# Patient Record
Sex: Male | Born: 1960 | Race: Black or African American | Hispanic: No | Marital: Married | Smoking: Former smoker
Health system: Southern US, Community
[De-identification: ages and names within clinical notes are randomized; demographics above are authoritative.]

## PROBLEM LIST (undated history)

## (undated) DIAGNOSIS — L729 Follicular cyst of the skin and subcutaneous tissue, unspecified: Secondary | ICD-10-CM

## (undated) DIAGNOSIS — Z972 Presence of dental prosthetic device (complete) (partial): Secondary | ICD-10-CM

## (undated) DIAGNOSIS — C259 Malignant neoplasm of pancreas, unspecified: Secondary | ICD-10-CM

## (undated) HISTORY — PX: LAPAROSCOPIC INGUINAL HERNIA REPAIR: SUR788

## (undated) HISTORY — DX: Malignant neoplasm of pancreas, unspecified: C25.9

## (undated) HISTORY — PX: FACIAL RECONSTRUCTION SURGERY: SHX631

---

## 1999-02-07 ENCOUNTER — Emergency Department (HOSPITAL_COMMUNITY): Admission: EM | Admit: 1999-02-07 | Discharge: 1999-02-07 | Payer: Self-pay | Admitting: Emergency Medicine

## 2003-11-19 ENCOUNTER — Encounter: Admission: RE | Admit: 2003-11-19 | Discharge: 2003-11-19 | Payer: Self-pay | Admitting: Sports Medicine

## 2005-06-09 ENCOUNTER — Ambulatory Visit (HOSPITAL_COMMUNITY): Admission: RE | Admit: 2005-06-09 | Discharge: 2005-06-09 | Payer: Self-pay | Admitting: Sports Medicine

## 2007-10-04 ENCOUNTER — Emergency Department (HOSPITAL_COMMUNITY): Admission: EM | Admit: 2007-10-04 | Discharge: 2007-10-04 | Payer: Self-pay | Admitting: Family Medicine

## 2007-10-22 ENCOUNTER — Emergency Department (HOSPITAL_COMMUNITY): Admission: EM | Admit: 2007-10-22 | Discharge: 2007-10-22 | Payer: Self-pay | Admitting: Emergency Medicine

## 2010-05-17 ENCOUNTER — Emergency Department (HOSPITAL_COMMUNITY): Admission: EM | Admit: 2010-05-17 | Discharge: 2010-05-17 | Payer: Self-pay | Admitting: Emergency Medicine

## 2010-10-01 ENCOUNTER — Encounter: Payer: Self-pay | Admitting: Sports Medicine

## 2011-05-30 ENCOUNTER — Inpatient Hospital Stay (INDEPENDENT_AMBULATORY_CARE_PROVIDER_SITE_OTHER)
Admission: RE | Admit: 2011-05-30 | Discharge: 2011-05-30 | Disposition: A | Discharge status: Home / Self Care | Payer: Self-pay | Source: Ambulatory Visit | Attending: Family Medicine | Admitting: Family Medicine

## 2011-05-30 DIAGNOSIS — M799 Soft tissue disorder, unspecified: Secondary | ICD-10-CM

## 2014-08-19 ENCOUNTER — Emergency Department (INDEPENDENT_AMBULATORY_CARE_PROVIDER_SITE_OTHER)
Admission: EM | Admit: 2014-08-19 | Discharge: 2014-08-19 | Disposition: A | Discharge status: Home / Self Care | Payer: Self-pay | Source: Home / Self Care

## 2014-08-19 ENCOUNTER — Encounter (HOSPITAL_COMMUNITY): Payer: Self-pay | Admitting: Emergency Medicine

## 2014-08-19 DIAGNOSIS — S134XXA Sprain of ligaments of cervical spine, initial encounter: Secondary | ICD-10-CM

## 2014-08-19 MED ORDER — HYDROCODONE-ACETAMINOPHEN 5-325 MG PO TABS: 1.0000 | ORAL_TABLET | Freq: Four times a day (QID) | ORAL | Status: DC | PRN

## 2014-08-19 MED ORDER — MELOXICAM 15 MG PO TABS: 15.0000 mg | ORAL_TABLET | Freq: Every day | ORAL | Status: DC

## 2014-08-19 MED ORDER — KETOROLAC TROMETHAMINE 60 MG/2ML IM SOLN
INTRAMUSCULAR | Status: AC
  Filled 2014-08-19: qty 2

## 2014-08-19 MED ORDER — KETOROLAC TROMETHAMINE 60 MG/2ML IM SOLN
60.0000 mg | Freq: Once | INTRAMUSCULAR | Status: AC
  Administered 2014-08-19: 60 mg via INTRAMUSCULAR

## 2014-08-19 MED ORDER — CYCLOBENZAPRINE HCL 10 MG PO TABS: 10.0000 mg | ORAL_TABLET | Freq: Three times a day (TID) | ORAL | Status: DC | PRN
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
## 2014-08-19 NOTE — Discharge Instructions (Signed)
You have whiplash. Take flexeril 1 pill 3 times a day as needed.  This medicine will make you sleepy. Take meloxicam 1 pill daily for the next week, then as needed.  Do not take with ibuprofen. Use the norco as needed for pain.  Do not drive while on this medication. Alternate heat and ice to the affected areas. Do gentle stretching.  Getting a massage or seeing a chiropractor is okay, but make sure they are licensed.   You should start to see improvement by next week, but this will likely take several weeks to fully resolve. Follow up as needed if your symptoms worsen or change.
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
## 2019-11-02 ENCOUNTER — Encounter (HOSPITAL_BASED_OUTPATIENT_CLINIC_OR_DEPARTMENT_OTHER): Payer: Self-pay | Admitting: *Deleted

## 2019-11-02 ENCOUNTER — Other Ambulatory Visit: Payer: Self-pay

## 2019-11-02 ENCOUNTER — Emergency Department (HOSPITAL_BASED_OUTPATIENT_CLINIC_OR_DEPARTMENT_OTHER)
Admission: EM | Admit: 2019-11-02 | Discharge: 2019-11-02 | Disposition: A | Discharge status: Home / Self Care | Payer: BC Managed Care – PPO | Attending: Emergency Medicine | Admitting: Emergency Medicine

## 2019-11-02 DIAGNOSIS — L729 Follicular cyst of the skin and subcutaneous tissue, unspecified: Secondary | ICD-10-CM

## 2019-11-02 DIAGNOSIS — L723 Sebaceous cyst: Secondary | ICD-10-CM | POA: Diagnosis not present

## 2019-11-02 DIAGNOSIS — R220 Localized swelling, mass and lump, head: Secondary | ICD-10-CM | POA: Diagnosis present

## 2019-11-02 MED ORDER — LIDOCAINE-EPINEPHRINE (PF) 2 %-1:200000 IJ SOLN
10.0000 mL | Freq: Once | INTRAMUSCULAR | Status: AC
  Administered 2019-11-02: 10 mL
  Filled 2019-11-02: qty 10

## 2019-11-02 MED ORDER — TETANUS-DIPHTH-ACELL PERTUSSIS 5-2.5-18.5 LF-MCG/0.5 IM SUSP
0.5000 mL | Freq: Once | INTRAMUSCULAR | Status: AC
  Administered 2019-11-02: 0.5 mL via INTRAMUSCULAR
  Filled 2019-11-02: qty 0.5

## 2019-11-02 MED ORDER — DOXYCYCLINE HYCLATE 100 MG PO CAPS: 100.0000 mg | ORAL_CAPSULE | Freq: Two times a day (BID) | ORAL | 0 refills | Status: DC

## 2019-11-02 NOTE — ED Provider Notes (Signed)
[facility] EMERGENCY DEPARTMENT Provider Note   CSN: [id number] Arrival date & time: 11/02/19  1639    History Chief Complaint  Patient presents with  . Abscess    Joe [name] is a 59 y.o. male with no significant past medical history who presents for evaluation of abscess.  Patient states he has had an abscess located to his posterior right scalp x2 months.  States has been growing in size.  Area is tender to palpation.  Patient states he has now pain that will occasionally radiate down his posterior neck.  No recent injuries or falls.  No fever, chills, nausea, vomiting, neck stiffness, neck rigidity, CP, SOB, dizziness, lightheadedness. Has not taken anything for pain. Rates his pain a 7/10. Denies additional aggravating or alleviating factors. Unsure of last tetanus  History obtained from patient and past medical records.  No interpreter is used.  HPI     History reviewed. No pertinent past medical history.  There are no problems to display for this patient.   History reviewed. No pertinent surgical history.     No family history on file.  Social History   Tobacco Use  . Smoking status: Never Smoker  . Smokeless tobacco: Never Used  Substance Use Topics  . Alcohol use: Yes    Comment: occasionally  . Drug use: No    Home Medications Prior to Admission medications   Medication Sig Start Date End Date Taking? Authorizing Provider  cyclobenzaprine (FLEXERIL) 10 MG tablet Take 1 tablet (10 mg total) by mouth 3 (three) times daily as needed for muscle spasms. 08/19/14   Melony Overly, MD  doxycycline (VIBRAMYCIN) 100 MG capsule Take 1 capsule (100 mg total) by mouth 2 (two) times daily. 11/02/19   Mikea Quadros A, PA-C  HYDROcodone-acetaminophen (NORCO) 5-325 MG per tablet Take 1 tablet by mouth every 6 (six) hours as needed for moderate pain. 08/19/14   Melony Overly, MD  meloxicam (MOBIC) 15 MG tablet Take 1 tablet (15 mg total) by mouth daily.  08/19/14   Melony Overly, MD    Allergies    Patient has no known allergies.  Review of Systems   Review of Systems  Constitutional: Negative.   HENT: Negative.   Respiratory: Negative.   Cardiovascular: Negative.   Gastrointestinal: Negative.   Genitourinary: Negative.   Musculoskeletal: Positive for neck pain. Negative for arthralgias, back pain, gait problem, joint swelling, myalgias and neck stiffness.  Skin: Positive for wound.  Neurological: Negative.   All other systems reviewed and are negative.   Physical Exam Updated Vital Signs BP (!) 159/79   Pulse 79   Temp 98.4 F (36.9 C) (Oral)   Resp 18   Ht 6\' 1"  (1.854 m)   Wt 78 kg   SpO2 99%   BMI 22.69 kg/m   Physical Exam Vitals and nursing note reviewed.  Constitutional:      General: He is not in acute distress.    Appearance: He is well-developed. He is not ill-appearing, toxic-appearing or diaphoretic.  HENT:     Head: Normocephalic and atraumatic. Mass present.     Jaw: There is normal jaw occlusion.      Nose: Nose normal.     Mouth/Throat:     Mouth: Mucous membranes are moist.     Pharynx: Oropharynx is clear.  Eyes:     Pupils: Pupils are equal, round, and reactive to light.  Neck:     Vascular: No JVD.  Trachea: Trachea and phonation normal.      Comments: Midline neck tenderness palpation. Cardiovascular:     Rate and Rhythm: Normal rate and regular rhythm.     Pulses: Normal pulses.     Heart sounds: Normal heart sounds.  Pulmonary:     Effort: Pulmonary effort is normal. No respiratory distress.     Breath sounds: Normal breath sounds.  Abdominal:     General: Bowel sounds are normal. There is no distension.     Palpations: Abdomen is soft.  Musculoskeletal:        General: Normal range of motion.     Cervical back: Full passive range of motion without pain, normal range of motion and neck supple.  Lymphadenopathy:     Cervical: Cervical adenopathy present.  Skin:    General:  Skin is warm and dry.     Comments: 4 cm rounded area with discrete borders to posterior right lower scalp.  No surrounding erythema, warmth.  Neurological:     Mental Status: He is alert.     ED Results / Procedures / Treatments   Labs (all labs ordered are listed, but only abnormal results are displayed) Labs Reviewed - No data to display  EKG None  Radiology No results found.  Procedures .Marland KitchenIncision and Drainage  Date/Time: 11/02/2019 5:25 PM Performed by: Nettie Elm, PA-C Authorized by: Nettie Elm, PA-C   Consent:    Consent obtained:  Verbal   Consent given by:  Patient   Risks discussed:  Bleeding, incomplete drainage, pain, damage to other organs and infection   Alternatives discussed:  No treatment, delayed treatment, alternative treatment, observation and referral Universal protocol:    Procedure explained and questions answered to patient or proxy's satisfaction: yes     Relevant documents present and verified: yes     Test results available and properly labeled: yes     Imaging studies available: yes     Required blood products, implants, devices, and special equipment available: yes     Site/side marked: yes     Immediately prior to procedure a time out was called: yes     Patient identity confirmed:  Verbally with patient Location:    Type:  Cyst   Size:  4cm   Location:  Head   Head location:  Scalp Pre-procedure details:    Skin preparation:  Betadine Anesthesia (see MAR for exact dosages):    Anesthesia method:  Local infiltration   Local anesthetic:  Lidocaine 1% WITH epi Procedure type:    Complexity:  Complex Procedure details:    Incision types:  Single straight   Incision depth:  Subcutaneous   Scalpel blade:  11   Wound management:  Probed and deloculated, irrigated with saline and extensive cleaning   Drainage:  Bloody   Drainage amount:  Moderate   Packing materials:  None Post-procedure details:    Patient tolerance of  procedure:  Tolerated well, no immediate complications   (including critical care time)  Medications Ordered in ED Medications  lidocaine-EPINEPHrine (XYLOCAINE W/EPI) 2 %-1:200000 (PF) injection 10 mL (10 mLs Infiltration Given 11/02/19 1706)  Tdap (BOOSTRIX) injection 0.5 mL (0.5 mLs Intramuscular Given 11/02/19 1707)    ED Course  I have reviewed the triage vital signs and the nursing notes.  Pertinent labs & imaging results that were available during my care of the patient were reviewed by me and considered in my medical decision making (see chart for details).  59 year old male presents  for evaluation of possible abscess versus mass to his posterior right lower scalp.  Present x2 months.  No systemic symptoms.  4 cm rounded area to right posterior scalp.  No overlying infectious changes.  Exam likely with cyst and long duration of time.  Discussed with patient I&D this emergency department however will need follow-up for permanent removal of sac of likely cysts.  He agrees to this.  Will update tetanus.  She also has some posterior right-sided neck pain which seems musculoskeletal in nature.  I am able to reproduce this on exam.  He does have some mild lymphadenopathy to posterior cervical region on that side.  No recent falls or injuries.  He is neurovascularly intact.  No chest pain, shortness of breath, dizziness or bruit.  Attempted I&D.  Lesion seems consistent with cyst.  Some mild drainage from it however unable to pack.  Discussed warm compress and he will need to follow-up with general surgery for removal of capsule. He will need to follow up with PCP for cervical lymphadenopathy. Given abx given same side as cyst in case infectious origin. Do not think we need imaging at this time.  The patient has been appropriately medically screened and/or stabilized in the ED. I have low suspicion for any other emergent medical condition which would require further screening, evaluation or  treatment in the ED or require inpatient management.    MDM Rules/Calculators/A&P                       Final Clinical Impression(s) / ED Diagnoses Final diagnoses:  Scalp cyst    Rx / DC Orders ED Discharge Orders         Ordered    doxycycline (VIBRAMYCIN) 100 MG capsule  2 times daily     11/02/19 1727           Derril Franek A, PA-C 11/02/19 1728    Maudie Flakes, MD 11/03/19 [phone]

## 2019-11-02 NOTE — Discharge Instructions (Signed)
Warm compress to area. Follow up with Dermatology or Surgery for definitive management.

## 2019-11-02 NOTE — ED Triage Notes (Signed)
Abscess to the back of his scalp for months.

## 2019-11-12 ENCOUNTER — Ambulatory Visit: Payer: Self-pay | Admitting: General Surgery

## 2019-11-12 NOTE — H&P (Signed)
Joe [name] Documented: 11/12/2019 9:52 AM Location: [facility] Patient #: [id number] DOB: [birth year] Married / Language: English / Race: Refused to Report/Unreported Male   History of Present Illness Joe [name] M. Rache Klimaszewski MD; 11/12/2019 10:15 AM) The patient is a 59 year old male who presents with an epidermal cyst. Patient is referred by the emergency room by [facility] PA-C for a right posterior scalp abscess. He presented to the ER in [place] 22nd with a lump that is been present for about 2 months. It gradually got swollen and inflamed and very painful and tender. He underwent incision and drainage in the ER and it was consistent with a cyst. He also had some lymphadenopathy on that side. He states that he thinks it is due to him hitting his head at work and getting infected. He states that the area has dramatically decreased in size. The lymph nodes have returned to normal side. He denied any other lymphadenopathy. He denies any weight change. Denies any fevers or chills. He states that he had another bump on his scalp that his wife tried to pop but was unsuccessful. He denies any chest pain, chest pressure, source of breath, dyspnea on exertion. He denies any alcohol or tobacco use. He denies drug use but I do smell marijuana  I reviewed the ER documentation from November 02, 2019  So reviewed his postoperative note from his hernia surgeon   Problem List/Past Medical Joe [name] M. 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 Oral Surgery   Diagnostic Studies History Malachi Bonds, CMA; 11/12/2019 9:53 AM) Colonoscopy  never  Allergies Malachi Bonds, CMA; 11/12/2019 9:53 AM) No Known Drug Allergies [11/12/2019]:  Medication History Malachi Bonds, CMA; 11/12/2019 9:53 AM) No Current Medications Medications Reconciled  Social History Malachi Bonds, CMA; 11/12/2019 9:53 AM) Alcohol use  Remotely quit alcohol use. Caffeine use  Tea. No drug use  Tobacco use  Former smoker.  Family History Malachi Bonds, CMA; 11/12/2019 9:53 AM) First Degree Relatives  No pertinent family history   Other Problems Joe [name] M. Joe Pulling, MD; 11/12/2019 10:15 AM) Inguinal Hernia     Review of Systems (Chemira Jones CMA; 11/12/2019 9:53 AM) General Not Present- Appetite Loss, Chills, Fatigue, Fever, Night Sweats, Weight Gain and Weight Loss. HEENT Present- Earache. Not Present- Hearing Loss, Hoarseness, Nose Bleed, Oral Ulcers, Ringing in the Ears, Seasonal Allergies, Sinus Pain, Sore Throat, Visual Disturbances, Wears glasses/contact lenses and Yellow Eyes. Respiratory Not Present- Bloody sputum, Chronic Cough, Difficulty Breathing, Snoring and Wheezing. Breast Not Present- Breast Mass, Breast Pain, Nipple Discharge and Skin Changes. Cardiovascular Not Present- Chest Pain, Difficulty Breathing Lying Down, Leg Cramps, Palpitations, Rapid Heart Rate, Shortness of Breath and Swelling of Extremities. Gastrointestinal Not Present- Abdominal Pain, Bloating, Bloody Stool, Change in Bowel Habits, Chronic diarrhea, Constipation, Difficulty Swallowing, Excessive gas, Gets full quickly at meals, Hemorrhoids, Indigestion, Nausea, Rectal Pain and Vomiting. Male Genitourinary Not Present- Blood in Urine, Change in Urinary Stream, Frequency, Impotence, Nocturia, Painful Urination, Urgency and Urine Leakage. Musculoskeletal Not Present- Back Pain, Joint Pain, Joint Stiffness, Muscle Pain, Muscle Weakness and Swelling of Extremities. Neurological Present- Headaches. Not Present- Decreased Memory, Fainting, Numbness, Seizures, Tingling, Tremor, Trouble walking and Weakness. Psychiatric Present- Bipolar. Not Present- Anxiety, Change in Sleep Pattern, Depression, Fearful and Frequent crying. Endocrine Not Present- Cold Intolerance, Excessive Hunger, Hair Changes, Heat  Intolerance, Hot flashes and New Diabetes. Hematology Not Present- Blood  Thinners, Easy Bruising, Excessive bleeding, Gland problems, HIV and Persistent Infections.  Vitals (Chemira Jones CMA; 11/12/2019 9:53 AM) 11/12/2019 9:53 AM Weight: 159.6 lb Height: 73in Body Surface Area: 1.96 m Body Mass Index: 21.06 kg/m  Temp.: 98.5F  Pulse: 91 (Regular)  BP: 140/78 (Sitting, Left Arm, Standard)       Physical Exam Joe [name] M. Stacia Feazell MD; 11/12/2019 10:13 AM) General Mental Status-Alert. General Appearance-Consistent with stated age. Hydration-Well hydrated. Voice-Normal.  Head and Neck Head-normocephalic, atraumatic with no lesions or palpable masses. Note: In the right posterior scalp there is a well-healed horizontal incision of about 1 cm. No surrounding induration or cellulitis. Trachea-midline. Thyroid Gland Characteristics - normal size and consistency.  Eye Eyeball - Bilateral-Extraocular movements intact. Sclera/Conjunctiva - Bilateral-No scleral icterus.  Chest and Lung Exam Chest and lung exam reveals -quiet, even and easy respiratory effort with no use of accessory muscles and on auscultation, normal breath sounds, no adventitious sounds and normal vocal resonance. Inspection Chest Wall - Normal. Back - normal.  Breast - Did not examine.  Cardiovascular Cardiovascular examination reveals -normal heart sounds, regular rate and rhythm with no murmurs and normal pedal pulses bilaterally.  Abdomen Inspection Inspection of the abdomen reveals - No Hernias. Skin - Scar - no surgical scars. Palpation/Percussion Palpation and Percussion of the abdomen reveal - Soft, Non Tender, No Rebound tenderness, No Rigidity (guarding) and No hepatosplenomegaly. Auscultation Auscultation of the abdomen reveals - Bowel sounds normal.  Peripheral Vascular Upper Extremity Palpation - Pulses bilaterally normal.  Neurologic Neurologic evaluation reveals  -alert and oriented x 3 with no impairment of recent or remote memory. Mental Status-Normal.  Neuropsychiatric The patient's mood and affect are described as -normal. Judgment and Insight-insight is appropriate concerning matters relevant to self.  Musculoskeletal Normal Exam - Left-Upper Extremity Strength Normal and Lower Extremity Strength Normal. Normal Exam - Right-Upper Extremity Strength Normal and Lower Extremity Strength Normal.  Lymphatic Head & Neck  General Head & Neck Lymphatics: Bilateral - Description - Normal. Axillary - Did not examine. Femoral & Inguinal - Did not examine.    Assessment & Plan Joe [name] M. Valerya Maxton MD; 11/12/2019 10:13 AM) SCALP CYST (L72.9) Impression: We discussed the etiology and management of sebaceous cysts (epidermoid inclusion cysts). The patient was given educational material. We discussed that these lesions can become infected at times. We discussed the signs and symptoms of infection. We discussed the only way to eliminate these lesions is to surgically excise the cyst and its wall in its entirety.  We discussed observation versus surgical excision. We discussed the risks and benefits of surgery including but not limited to bleeding, infection, injury to surrounding structures, scarring, cosmetic concerns, blood clot formation, anesthesia issues, possible recurrence, and the typical postoperative course as well as pain control after surgery  The patient has elected to proceed with Excision of Right posterior scalp cyst  This is a low complex medical problem requiring a low level of medical decision making. Current Plans You are being scheduled for surgery- Our schedulers will call you.  You should hear from our office's scheduling department within 5 working days about the location, date, and time of surgery. We try to make accommodations for patient's preferences in scheduling surgery, but sometimes the OR schedule or the surgeon's  schedule prevents [place] from making those accommodations.  If you have not heard from our office [phone]) in 5 working days, call the office and ask for your surgeon's nurse.  If you have other questions about your diagnosis,  plan, or surgery, call the office and ask for your surgeon's nurse.  Written instructions provided  Leighton Ruff. Joe Pulling, MD, FACS General, Bariatric, & Minimally Invasive Surgery [facility], [place]

## 2020-01-06 ENCOUNTER — Encounter (HOSPITAL_BASED_OUTPATIENT_CLINIC_OR_DEPARTMENT_OTHER): Payer: Self-pay | Admitting: General Surgery

## 2020-01-07 ENCOUNTER — Other Ambulatory Visit: Payer: Self-pay

## 2020-01-07 ENCOUNTER — Encounter (HOSPITAL_BASED_OUTPATIENT_CLINIC_OR_DEPARTMENT_OTHER): Payer: Self-pay | Admitting: General Surgery

## 2020-01-07 NOTE — Progress Notes (Signed)
Spoke w/ via phone for pre-op interview--- PT Lab needs dos----   no            Lab results------ no COVID test ------ 01-11-2020 2 1315 Arrive at ------- 0530 NPO after ------ MN Medications to take morning of surgery ----- NONE Diabetic medication ----- n/a Patient Special Instructions ----- n/a Pre-Op special Istructions ----- n/a Patient verbalized understanding of instructions that were given at this phone interview. Patient denies shortness of breath, chest pain, fever, cough at this phone interview.

## 2020-01-11 ENCOUNTER — Other Ambulatory Visit (HOSPITAL_COMMUNITY)
Admission: RE | Admit: 2020-01-11 | Discharge: 2020-01-11 | Disposition: A | Discharge status: Home / Self Care | Payer: BC Managed Care – PPO | Source: Ambulatory Visit | Attending: General Surgery | Admitting: General Surgery

## 2020-01-11 DIAGNOSIS — Z20822 Contact with and (suspected) exposure to covid-19: Secondary | ICD-10-CM | POA: Insufficient documentation

## 2020-01-11 DIAGNOSIS — Z01812 Encounter for preprocedural laboratory examination: Secondary | ICD-10-CM | POA: Insufficient documentation

## 2020-01-11 LAB — SARS CORONAVIRUS 2 (TAT 6-24 HRS): SARS Coronavirus 2: NEGATIVE

## 2020-01-14 ENCOUNTER — Encounter (HOSPITAL_BASED_OUTPATIENT_CLINIC_OR_DEPARTMENT_OTHER)
Admission: RE | Disposition: A | Discharge status: Home / Self Care | Payer: Self-pay | Source: Home / Self Care | Attending: General Surgery

## 2020-01-14 ENCOUNTER — Encounter (HOSPITAL_BASED_OUTPATIENT_CLINIC_OR_DEPARTMENT_OTHER): Payer: Self-pay | Admitting: General Surgery

## 2020-01-14 ENCOUNTER — Ambulatory Visit (HOSPITAL_BASED_OUTPATIENT_CLINIC_OR_DEPARTMENT_OTHER)
Admission: RE | Admit: 2020-01-14 | Discharge: 2020-01-14 | Disposition: A | Discharge status: Home / Self Care | Payer: BC Managed Care – PPO | Attending: General Surgery | Admitting: General Surgery

## 2020-01-14 ENCOUNTER — Ambulatory Visit (HOSPITAL_BASED_OUTPATIENT_CLINIC_OR_DEPARTMENT_OTHER): Payer: BC Managed Care – PPO | Admitting: Anesthesiology

## 2020-01-14 DIAGNOSIS — Z87891 Personal history of nicotine dependence: Secondary | ICD-10-CM | POA: Insufficient documentation

## 2020-01-14 DIAGNOSIS — L729 Follicular cyst of the skin and subcutaneous tissue, unspecified: Secondary | ICD-10-CM | POA: Diagnosis present

## 2020-01-14 DIAGNOSIS — L7211 Pilar cyst: Secondary | ICD-10-CM | POA: Insufficient documentation

## 2020-01-14 HISTORY — DX: Follicular cyst of the skin and subcutaneous tissue, unspecified: L72.9

## 2020-01-14 HISTORY — DX: Presence of dental prosthetic device (complete) (partial): Z97.2

## 2020-01-14 HISTORY — PX: CYST EXCISION: SHX5701

## 2020-01-14 SURGERY — CYST REMOVAL
Anesthesia: Monitor Anesthesia Care | Site: Scalp | Laterality: Right

## 2020-01-14 MED ORDER — ACETAMINOPHEN 500 MG PO TABS
1000.0000 mg | ORAL_TABLET | Freq: Three times a day (TID) | ORAL | 0 refills | Status: AC
Start: 2020-01-14 — End: 2020-01-19

## 2020-01-14 MED ORDER — CEFAZOLIN SODIUM-DEXTROSE 2-4 GM/100ML-% IV SOLN
2.0000 g | INTRAVENOUS | Status: AC
  Administered 2020-01-14: 2 g via INTRAVENOUS

## 2020-01-14 MED ORDER — BUPIVACAINE HCL (PF) 0.25 % IJ SOLN
INTRAMUSCULAR | Status: DC | PRN
  Administered 2020-01-14: 4 mL

## 2020-01-14 MED ORDER — PROPOFOL 500 MG/50ML IV EMUL
INTRAVENOUS | Status: DC | PRN
  Administered 2020-01-14: 125 ug/kg/min via INTRAVENOUS

## 2020-01-14 MED ORDER — PROPOFOL 10 MG/ML IV BOLUS
INTRAVENOUS | Status: DC | PRN
  Administered 2020-01-14: 20 mg via INTRAVENOUS

## 2020-01-14 MED ORDER — GABAPENTIN 100 MG PO CAPS
200.0000 mg | ORAL_CAPSULE | ORAL | Status: AC
  Administered 2020-01-14: 200 mg via ORAL

## 2020-01-14 MED ORDER — ENSURE PRE-SURGERY PO LIQD: 296.0000 mL | Freq: Once | ORAL | Status: DC

## 2020-01-14 MED ORDER — GABAPENTIN 100 MG PO CAPS
ORAL_CAPSULE | ORAL | Status: AC
  Filled 2020-01-14: qty 2

## 2020-01-14 MED ORDER — FENTANYL CITRATE (PF) 100 MCG/2ML IJ SOLN
INTRAMUSCULAR | Status: DC | PRN
  Administered 2020-01-14 (×2): 50 ug via INTRAVENOUS

## 2020-01-14 MED ORDER — IBUPROFEN 800 MG PO TABS
800.0000 mg | ORAL_TABLET | Freq: Three times a day (TID) | ORAL | 0 refills | Status: AC
Start: 2020-01-14 — End: 2020-01-16

## 2020-01-14 MED ORDER — CHLORHEXIDINE GLUCONATE CLOTH 2 % EX PADS: 6.0000 | MEDICATED_PAD | Freq: Once | CUTANEOUS | Status: DC

## 2020-01-14 MED ORDER — BUPIVACAINE-EPINEPHRINE 0.5% -1:200000 IJ SOLN
INTRAMUSCULAR | Status: DC | PRN
  Administered 2020-01-14: 4 mL

## 2020-01-14 MED ORDER — MIDAZOLAM HCL 5 MG/5ML IJ SOLN
INTRAMUSCULAR | Status: DC | PRN
  Administered 2020-01-14: 2 mg via INTRAVENOUS

## 2020-01-14 MED ORDER — ACETAMINOPHEN 500 MG PO TABS
ORAL_TABLET | ORAL | Status: AC
  Filled 2020-01-14: qty 2

## 2020-01-14 MED ORDER — ONDANSETRON HCL 4 MG/2ML IJ SOLN
INTRAMUSCULAR | Status: DC | PRN
  Administered 2020-01-14: 4 mg via INTRAVENOUS

## 2020-01-14 MED ORDER — ONDANSETRON HCL 4 MG/2ML IJ SOLN
INTRAMUSCULAR | Status: AC
  Filled 2020-01-14: qty 2

## 2020-01-14 MED ORDER — FENTANYL CITRATE (PF) 100 MCG/2ML IJ SOLN: 25.0000 ug | INTRAMUSCULAR | Status: DC | PRN

## 2020-01-14 MED ORDER — TRAMADOL HCL 50 MG PO TABS: 50.0000 mg | ORAL_TABLET | Freq: Four times a day (QID) | ORAL | 0 refills | Status: DC | PRN

## 2020-01-14 MED ORDER — LACTATED RINGERS IV SOLN: INTRAVENOUS | Status: DC

## 2020-01-14 MED ORDER — BACITRACIN-NEOMYCIN-POLYMYXIN 400-5-5000 EX OINT
TOPICAL_OINTMENT | CUTANEOUS | Status: DC | PRN
  Administered 2020-01-14: 1 via TOPICAL

## 2020-01-14 MED ORDER — CEFAZOLIN SODIUM-DEXTROSE 2-4 GM/100ML-% IV SOLN
INTRAVENOUS | Status: AC
  Filled 2020-01-14: qty 100

## 2020-01-14 MED ORDER — ACETAMINOPHEN 500 MG PO TABS
1000.0000 mg | ORAL_TABLET | ORAL | Status: AC
  Administered 2020-01-14: 1000 mg via ORAL

## 2020-01-14 MED ORDER — FENTANYL CITRATE (PF) 100 MCG/2ML IJ SOLN
INTRAMUSCULAR | Status: AC
  Filled 2020-01-14: qty 2

## 2020-01-14 MED ORDER — PROPOFOL 500 MG/50ML IV EMUL
INTRAVENOUS | Status: AC
  Filled 2020-01-14: qty 50

## 2020-01-14 MED ORDER — MIDAZOLAM HCL 2 MG/2ML IJ SOLN
INTRAMUSCULAR | Status: AC
  Filled 2020-01-14: qty 2

## 2020-01-14 SURGICAL SUPPLY — 62 items
ADH SKN CLS APL DERMABOND .7 (GAUZE/BANDAGES/DRESSINGS)
APL PRP STRL LF DISP 70% ISPRP (MISCELLANEOUS) ×1
APL SKNCLS STERI-STRIP NONHPOA (GAUZE/BANDAGES/DRESSINGS)
BENZOIN TINCTURE PRP APPL 2/3 (GAUZE/BANDAGES/DRESSINGS) IMPLANT
BLADE CLIPPER SENSICLIP SURGIC (BLADE) IMPLANT
BLADE HEX COATED 2.75 (ELECTRODE) ×3 IMPLANT
BLADE SURG 15 STRL LF DISP TIS (BLADE) ×1 IMPLANT
BLADE SURG 15 STRL SS (BLADE) ×3
CANISTER SUCT 1200ML W/VALVE (MISCELLANEOUS) IMPLANT
CANISTER SUCT 3000ML PPV (MISCELLANEOUS) ×2 IMPLANT
CHLORAPREP W/TINT 26 (MISCELLANEOUS) ×2 IMPLANT
CLOSURE WOUND 1/2 X4 (GAUZE/BANDAGES/DRESSINGS)
COVER BACK TABLE 60X90IN (DRAPES) ×3 IMPLANT
COVER MAYO STAND STRL (DRAPES) ×3 IMPLANT
COVER WAND RF STERILE (DRAPES) ×3 IMPLANT
DECANTER SPIKE VIAL GLASS SM (MISCELLANEOUS) IMPLANT
DERMABOND ADVANCED (GAUZE/BANDAGES/DRESSINGS)
DERMABOND ADVANCED .7 DNX12 (GAUZE/BANDAGES/DRESSINGS) IMPLANT
DISSECTOR ROUND CHERRY 3/8 STR (MISCELLANEOUS) IMPLANT
DRAPE LAPAROSCOPIC ABDOMINAL (DRAPES) IMPLANT
DRAPE LAPAROTOMY 100X72 PEDS (DRAPES) ×3 IMPLANT
DRAPE LAPAROTOMY T 102X78X121 (DRAPES) IMPLANT
DRAPE LAPAROTOMY TRNSV 102X78 (DRAPES) IMPLANT
DRAPE SHEET LG 3/4 BI-LAMINATE (DRAPES) IMPLANT
DRAPE UTILITY XL STRL (DRAPES) ×3 IMPLANT
ELECT NDL TIP 2.8 STRL (NEEDLE) IMPLANT
ELECT NEEDLE TIP 2.8 STRL (NEEDLE) IMPLANT
ELECT REM PT RETURN 9FT ADLT (ELECTROSURGICAL) ×3
ELECTRODE REM PT RTRN 9FT ADLT (ELECTROSURGICAL) ×1 IMPLANT
GAUZE PACKING IODOFORM 1/4X15 (PACKING) IMPLANT
GAUZE SPONGE 4X4 12PLY STRL (GAUZE/BANDAGES/DRESSINGS) ×3 IMPLANT
GLOVE BIO SURGEON STRL SZ7.5 (GLOVE) ×3 IMPLANT
GLOVE INDICATOR 8.0 STRL GRN (GLOVE) ×3 IMPLANT
GOWN STRL REUS W/TWL XL LVL3 (GOWN DISPOSABLE) ×3 IMPLANT
KIT TURNOVER CYSTO (KITS) ×3 IMPLANT
MANIFOLD NEPTUNE II (INSTRUMENTS) ×2 IMPLANT
MARKER SKIN DUAL TIP RULER LAB (MISCELLANEOUS) IMPLANT
NEEDLE HYPO 25X1 1.5 SAFETY (NEEDLE) ×3 IMPLANT
NS IRRIG 500ML POUR BTL (IV SOLUTION) ×2 IMPLANT
PENCIL BUTTON HOLSTER BLD 10FT (ELECTRODE) ×3 IMPLANT
SET BASIN DAY SURGERY F.S. (CUSTOM PROCEDURE TRAY) ×3 IMPLANT
SPONGE LAP 18X18 RF (DISPOSABLE) IMPLANT
SPONGE LAP 4X18 RFD (DISPOSABLE) IMPLANT
STAPLER VISISTAT 35W (STAPLE) IMPLANT
STRIP CLOSURE SKIN 1/2X4 (GAUZE/BANDAGES/DRESSINGS) IMPLANT
SUT MNCRL AB 4-0 PS2 18 (SUTURE) IMPLANT
SUT MON AB 4-0 PC3 18 (SUTURE) IMPLANT
SUT PROLENE 5 0 P 3 (SUTURE) ×2 IMPLANT
SUT VIC AB 3-0 SH 18 (SUTURE) IMPLANT
SUT VIC AB 3-0 SH 27 (SUTURE)
SUT VIC AB 3-0 SH 27X BRD (SUTURE) IMPLANT
SUT VICRYL 3 0 CT 3 (SUTURE) ×2 IMPLANT
SWAB COLLECTION DEVICE MRSA (MISCELLANEOUS) IMPLANT
SWAB CULTURE ESWAB REG 1ML (MISCELLANEOUS) IMPLANT
SYR BULB EAR ULCER 3OZ GRN STR (SYRINGE) ×3 IMPLANT
SYR CONTROL 10ML LL (SYRINGE) ×3 IMPLANT
TOWEL OR 17X26 10 PK STRL BLUE (TOWEL DISPOSABLE) ×6 IMPLANT
TRAY DSU PREP LF (CUSTOM PROCEDURE TRAY) IMPLANT
TUBE CONNECTING 12'X1/4 (SUCTIONS) ×1
TUBE CONNECTING 12X1/4 (SUCTIONS) ×2 IMPLANT
WATER STERILE IRR 500ML POUR (IV SOLUTION) ×3 IMPLANT
YANKAUER SUCT BULB TIP NO VENT (SUCTIONS) ×2 IMPLANT

## 2020-01-14 NOTE — Op Note (Signed)
01/14/2020  8:21 AM  PATIENT:  Joe [name]  59 y.o. male  PRE-OPERATIVE DIAGNOSIS:  RIGHT POSTERIOR SCALP CYST  POST-OPERATIVE DIAGNOSIS:  RIGHT POSTERIOR PILAR CYST  PROCEDURE:  Procedure(s): EXCISION OF RIGHT POSTERIOR SCALP PILAR CYST (1.5 x 2cm)  SURGEON:  Surgeon(s): Greer Pickerel, MD  ASSISTANTS: none   ANESTHESIA:   local and MAC  DRAINS: none   LOCAL MEDICATIONS USED:  MARCAINE     SPECIMEN:  Source of Specimen:  scalp pilar cyst  DISPOSITION OF SPECIMEN:  PATHOLOGY  COUNTS:  YES  INDICATION FOR PROCEDURE: 59 year old male presented for elective excision of the right posterior scalp Pilar cyst.  He had required incision and drainage in the emergency department.  He did not want have to go through that again so he wanted definitive management.  Please see outside records for additional details  PROCEDURE: In the holding area the patient and I confirmed the location of the right posterior scalp cyst.  It was marked with a marking pen.  I also initialed on his right side.  The cyst was very decompressed but still firm.  He was then taken back to the OR 3 at [facility] surgical center placed supine on the OR table and monitored anesthesia care was established.  He was then placed in the lateral position with the appropriate padding.  His right scalp was prepped with Hibiclens.  The hair was matted out of the direction of the planned incision.  Identified the scar from his prior incision and drainage.  A surgical timeout was performed.  He received IV antibiotic.  Local was infiltrated.  Incision was made through his old scar.  Deep dermis was divided.  Identified a hard firm sac that was collapsed.  It was sharply excised with a pair of Metzenbaum scissors.  I did end up coming across a small tiny vessel that bled a little bit.  It required two 4-0 Vicryl sutures for hemostasis.  The cyst was excised in its entirety.  I freshened the skin edges with the 15 blade.  I then  brought the deep dermis together with 2 interrupted 3-0 Vicryl sutures.  The skin was then reapproximated with 5 interrupted 5-0 Prolene sutures followed by antibiotic ointment.  All needle, instrument sponge counts were correct x2 there were no immediate complications.  Length of the incision was 1-1/2 inches.  Dimensions of the cyst were 1-1/2 x 2 cm.  It was not sent for analysis.  PLAN OF CARE: Discharge to home after PACU  PATIENT DISPOSITION:  PACU - hemodynamically stable.   Delay start of Pharmacological VTE agent (>24hrs) due to surgical blood loss or risk of bleeding:  no  Leighton Ruff. Redmond Pulling, MD, FACS General, Bariatric, & Minimally Invasive Surgery [facility], [place]

## 2020-01-14 NOTE — Anesthesia Procedure Notes (Signed)
Procedure Name: MAC Date/Time: 01/14/2020 7:38 AM Performed by: Lollie Sails, CRNA Pre-anesthesia Checklist: Patient identified, Emergency Drugs available, Suction available, Patient being monitored and Timeout performed Oxygen Delivery Method: Simple face mask

## 2020-01-14 NOTE — Discharge Instructions (Signed)
[facility]  POST OP INSTRUCTIONS Always review your discharge instruction sheet given to you by the facility where your surgery was performed. IF YOU HAVE DISABILITY OR FAMILY LEAVE FORMS, YOU MUST BRING THEM TO THE OFFICE FOR PROCESSING.   DO NOT GIVE THEM TO YOUR DOCTOR.  PAIN CONTROL  1. First take acetaminophen (Tylenol) AND/or ibuprofen (Advil) to control your pain after surgery.  Follow directions on package.  Taking acetaminophen (Tylenol) and/or ibuprofen (Advil) regularly after surgery will help to control your pain and lower the amount of prescription pain medication you may need.  You should not take more than 3,000 mg (3 grams) of acetaminophen (Tylenol) in 24 hours.  You should not take ibuprofen (Advil), aleve, motrin, naprosyn or other NSAIDS if you have a history of stomach ulcers or chronic kidney disease. SEE PAIN CONTROL HANDOUT 2. A prescription for pain medication may be given to you upon discharge.  Take your pain medication as prescribed, if you still have uncontrolled pain after taking acetaminophen (Tylenol) or ibuprofen (Advil). 3. Use ice packs to help control pain. 4. If you need a refill on your pain medication, please contact your pharmacy.  They will contact our office to request authorization. Prescriptions will not be filled after 5pm or on week-ends.  HOME MEDICATIONS 5. Take your usually prescribed medications unless otherwise directed.  DIET 6. You should follow a light diet the first few days after arrival home.  Be sure to include lots of fluids daily. Avoid fatty, fried foods.   CONSTIPATION 7. It is common to experience some constipation after surgery and if you are taking pain medication.  Increasing fluid intake and taking a stool softener (such as Colace) will usually help or prevent this problem from occurring.  A mild laxative (Milk of Magnesia or Miralax) should be taken according to package instructions if there are no bowel  movements after 48 hours.  WOUND/INCISION CARE 8. Most patients will experience some swelling and bruising in the area of the incisions.  Ice packs will help.  Swelling and bruising can take several days to resolve.  9. Unless discharge instructions indicate otherwise, follow guidelines below  a. STERI-STRIPS - you may remove your outer bandages 48 hours after surgery, and you may shower at that time.  You have steri-strips (small skin tapes) in place directly over the incision.  These strips should be left on the skin for 7-10 days.   b. DERMABOND/SKIN GLUE - you may shower in 24 hours.  The glue will flake off over the next 2-3 weeks. 10. Any sutures or staples will be removed at the office during your follow-up visit.  ACTIVITIES a. You may resume regular (light) daily activities beginning the next day--such as daily self-care, walking, climbing stairs--gradually increasing activities as tolerated.  You may have sexual intercourse when it is comfortable.  You may drive when you are no longer taking prescription pain medication, you can comfortably wear a seatbelt, and you can safely maneuver your car and apply brakes.  FOLLOW-UP 11. You should see your doctor in the office for a follow-up appointment approximately 2-3 weeks after your surgery.  You should have been given your post-op/follow-up appointment when your surgery was scheduled.  If you did not receive a post-op/follow-up appointment, make sure that you call for this appointment within a day or two after you arrive home to insure a convenient appointment time.  OTHER INSTRUCTIONS 12.   WHEN TO CALL YOUR DOCTOR: 1. Fever  over 101.0 2. Inability to urinate 3. Continued bleeding from incision. 4. Increased pain, redness, or drainage from the incision. 5. Increasing abdominal pain  The clinic staff is available to answer your questions during regular business hours.  Please don't hesitate to call and ask to speak to one of the nurses  for clinical concerns.  If you have a medical emergency, go to the nearest emergency room or call 911.  A surgeon from [facility] is always on call at the hospital. [address], [place], [place], [place]  [address] ? [address], [place], [place]   [address] [phone] ? [phone] ? FAX [phone] Web site: [email or web address]       ........Marland Kitchen   Managing Your Pain After Surgery Without Opioids    Thank you for participating in our program to help patients manage their pain after surgery without opioids. This is part of our effort to provide you with the best care possible, without exposing you or your family to the risk that opioids pose.  What pain can I expect after surgery? You can expect to have some pain after surgery. This is normal. The pain is typically worse the day after surgery, and quickly begins to get better. Many studies have found that many patients are able to manage their pain after surgery with Over-the-Counter (OTC) medications such as Tylenol and Motrin. If you have a condition that does not allow you to take Tylenol or Motrin, notify your surgical team.  How will I manage my pain? The best strategy for controlling your pain after surgery is around the clock pain control with Tylenol (acetaminophen) and Motrin (ibuprofen or Advil). Alternating these medications with each other allows you to maximize your pain control. In addition to Tylenol and Motrin, you can use heating pads or ice packs on your incisions to help reduce your pain.  How will I alternate your regular strength over-the-counter pain medication? You will take a dose of pain medication every three hours. ; Start by taking 650 mg of Tylenol (2 pills of 325 mg) ; 3 hours later take 600 mg of Motrin (3 pills of 200 mg) ; 3 hours after taking the Motrin take 650 mg of Tylenol ; 3 hours after that take 600 mg of Motrin.   - 1 -  See example - if your first  dose of Tylenol is at 12:00 PM   12:00 PM Tylenol 650 mg (2 pills of 325 mg)  3:00 PM Motrin 600 mg (3 pills of 200 mg)  6:00 PM Tylenol 650 mg (2 pills of 325 mg)  9:00 PM Motrin 600 mg (3 pills of 200 mg)  Continue alternating every 3 hours   We recommend that you follow this schedule around-the-clock for at least 3 days after surgery, or until you feel that it is no longer needed. Use the table on the last page of this handout to keep track of the medications you are taking. Important: Do not take more than 3000mg  of Tylenol or 1800mg  of Motrin in a 24-hour period. Do not take ibuprofen/Motrin if you have a history of bleeding stomach ulcers, severe kidney disease, &/or actively taking a blood thinner  What if I still have pain? If you have pain that is not controlled with the over-the-counter pain medications (Tylenol and Motrin or Advil) you might have what we call "breakthrough" pain. You will receive a prescription for a small amount of an opioid pain medication such as Oxycodone, Tramadol,  or Tylenol with Codeine. Use these opioid pills in the first 24 hours after surgery if you have breakthrough pain. Do not take more than 1 pill every 4-6 hours.  If you still have uncontrolled pain after using all opioid pills, don't hesitate to call our staff using the number provided. We will help make sure you are managing your pain in the best way possible, and if necessary, we can provide a prescription for additional pain medication.   Day 1    Time  Name of Medication Number of pills taken  Amount of Acetaminophen  Pain Level   Comments  AM PM       AM PM       AM PM       AM PM       AM PM       AM PM       AM PM       AM PM       Total Daily amount of Acetaminophen Do not take more than  3,000 mg per day      Day 2    Time  Name of Medication Number of pills taken  Amount of Acetaminophen  Pain Level   Comments  AM PM       AM PM       AM PM       AM PM       AM  PM       AM PM       AM PM       AM PM       Total Daily amount of Acetaminophen Do not take more than  3,000 mg per day      Day 3    Time  Name of Medication Number of pills taken  Amount of Acetaminophen  Pain Level   Comments  AM PM       AM PM       AM PM       AM PM          AM PM       AM PM       AM PM       AM PM       Total Daily amount of Acetaminophen Do not take more than  3,000 mg per day      Day 4    Time  Name of Medication Number of pills taken  Amount of Acetaminophen  Pain Level   Comments  AM PM       AM PM       AM PM       AM PM       AM PM       AM PM       AM PM       AM PM       Total Daily amount of Acetaminophen Do not take more than  3,000 mg per day      Day 5    Time  Name of Medication Number of pills taken  Amount of Acetaminophen  Pain Level   Comments  AM PM       AM PM       AM PM       AM PM       AM PM       AM PM       AM PM  AM PM       Total Daily amount of Acetaminophen Do not take more than  3,000 mg per day       Day 6    Time  Name of Medication Number of pills taken  Amount of Acetaminophen  Pain Level  Comments  AM PM       AM PM       AM PM       AM PM       AM PM       AM PM       AM PM       AM PM       Total Daily amount of Acetaminophen Do not take more than  3,000 mg per day      Day 7    Time  Name of Medication Number of pills taken  Amount of Acetaminophen  Pain Level   Comments  AM PM       AM PM       AM PM       AM PM       AM PM       AM PM       AM PM       AM PM       Total Daily amount of Acetaminophen Do not take more than  3,000 mg per day        For additional information about how and where to safely dispose of unused opioid medications - [employer]  Disclaimer: This document contains information and/or instructional materials adapted from [facility] for the typical patient with your condition. It does  not replace medical advice from your health care provider because your experience may differ from that of the typical patient. Talk to your health care provider if you have any questions about this document, your condition or your treatment plan. Adapted from [facility] Instructions  Activity: Get plenty of rest for the remainder of the day. A responsible individual must stay with you for 24 hours following the procedure.  For the next 24 hours, DO NOT: -Drive a car -Paediatric nurse -Drink alcoholic beverages -Take any medication unless instructed by your physician -Make any legal decisions or sign important papers.  Meals: Start with liquid foods such as gelatin or soup. Progress to regular foods as tolerated. Avoid greasy, spicy, heavy foods. If nausea and/or vomiting occur, drink only clear liquids until the nausea and/or vomiting subsides. Call your physician if vomiting continues.  Special Instructions/Symptoms: Your throat may feel dry or sore from the anesthesia or the breathing tube placed in your throat during surgery. If this causes discomfort, gargle with warm salt water. The discomfort should disappear within 24 hours.

## 2020-01-14 NOTE — Transfer of Care (Signed)
Immediate Anesthesia Transfer of Care Note  Patient: Joe [name]  Procedure(s) Performed: EXCISION OF RIGHT POSTERIOR SCALP CYST (Right Scalp)  Patient Location: PACU  Anesthesia Type:MAC  Level of Consciousness: drowsy and responds to stimulation  Airway & Oxygen Therapy: Patient Spontanous Breathing and Patient connected to face mask oxygen  Post-op Assessment: Report given to RN and Post -op Vital signs reviewed and stable  Post vital signs: Reviewed and stable  Last Vitals:  Vitals Value Taken Time  BP 106/62 01/14/20 0825  Temp    Pulse 80 01/14/20 0827  Resp 22 01/14/20 0827  SpO2 100 % 01/14/20 0827  Vitals shown include unvalidated device data.  Last Pain:  Vitals:   01/14/20 0557  TempSrc: Oral  PainSc: 0-No pain      Patients Stated Pain Goal: 3 (42/35/36 1443)  Complications: No apparent anesthesia complications
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
## 2020-01-14 NOTE — OR Nursing (Signed)
0.5% bupivacaine with epinephrine requested by Dr. Redmond Pulling.

## 2020-01-14 NOTE — Anesthesia Preprocedure Evaluation (Addendum)
Anesthesia Evaluation  Patient identified by MRN, date of birth, ID band Patient awake    Reviewed: Allergy & Precautions, NPO status , Patient's Chart, lab work & pertinent test results  Airway Mallampati: II  TM Distance: >3 FB Neck ROM: Full    Dental  (+) Missing, Poor Dentition, Dental Advisory Given,    Pulmonary neg pulmonary ROS, former smoker,    Pulmonary exam normal breath sounds clear to auscultation       Cardiovascular negative cardio ROS Normal cardiovascular exam Rhythm:Regular Rate:Normal     Neuro/Psych negative neurological ROS  negative psych ROS   GI/Hepatic negative GI ROS, Neg liver ROS,   Endo/Other  negative endocrine ROS  Renal/GU negative Renal ROS  negative genitourinary   Musculoskeletal negative musculoskeletal ROS (+)   Abdominal   Peds  Hematology negative hematology ROS (+)   Anesthesia Other Findings   Reproductive/Obstetrics                            Anesthesia Physical Anesthesia Plan  ASA: II  Anesthesia Plan: MAC   Post-op Pain Management:    Induction: Intravenous  PONV Risk Score and Plan: 1 and Propofol infusion, Treatment may vary due to age or medical condition, Midazolam and Ondansetron  Airway Management Planned: Natural Airway  Additional Equipment:   Intra-op Plan:   Post-operative Plan:   Informed Consent: I have reviewed the patients History and Physical, chart, labs and discussed the procedure including the risks, benefits and alternatives for the proposed anesthesia with the patient or authorized representative who has indicated his/her understanding and acceptance.     Dental advisory given  Plan Discussed with: CRNA  Anesthesia Plan Comments:         Anesthesia Quick Evaluation

## 2020-01-14 NOTE — Anesthesia Postprocedure Evaluation (Signed)
Anesthesia Post Note  Patient: Joe [name]  Procedure(s) Performed: EXCISION OF RIGHT POSTERIOR SCALP CYST (Right Scalp)     Patient location during evaluation: PACU Anesthesia Type: MAC Level of consciousness: awake and alert Pain management: pain level controlled Vital Signs Assessment: post-procedure vital signs reviewed and stable Respiratory status: spontaneous breathing, nonlabored ventilation, respiratory function stable and patient connected to nasal cannula oxygen Cardiovascular status: stable and blood pressure returned to baseline Postop Assessment: no apparent nausea or vomiting Anesthetic complications: no    Last Vitals:  Vitals:   01/14/20 0851 01/14/20 0900  BP:  118/80  Pulse:  63  Resp:  14  Temp:    SpO2: 100% 100%    Last Pain:  Vitals:   01/14/20 0900  TempSrc:   PainSc: 0-No pain                 Vernelle Wisner L Loretta Kluender

## 2020-01-22 ENCOUNTER — Emergency Department (HOSPITAL_BASED_OUTPATIENT_CLINIC_OR_DEPARTMENT_OTHER): Payer: BC Managed Care – PPO

## 2020-01-22 ENCOUNTER — Other Ambulatory Visit: Payer: Self-pay

## 2020-01-22 ENCOUNTER — Encounter (HOSPITAL_BASED_OUTPATIENT_CLINIC_OR_DEPARTMENT_OTHER): Payer: Self-pay | Admitting: *Deleted

## 2020-01-22 ENCOUNTER — Emergency Department (HOSPITAL_BASED_OUTPATIENT_CLINIC_OR_DEPARTMENT_OTHER)
Admission: EM | Admit: 2020-01-22 | Discharge: 2020-01-22 | Disposition: A | Discharge status: Home / Self Care | Payer: BC Managed Care – PPO | Attending: Emergency Medicine | Admitting: Emergency Medicine

## 2020-01-22 DIAGNOSIS — K7689 Other specified diseases of liver: Secondary | ICD-10-CM | POA: Insufficient documentation

## 2020-01-22 DIAGNOSIS — R109 Unspecified abdominal pain: Secondary | ICD-10-CM | POA: Diagnosis present

## 2020-01-22 DIAGNOSIS — N281 Cyst of kidney, acquired: Secondary | ICD-10-CM | POA: Diagnosis not present

## 2020-01-22 DIAGNOSIS — Z79899 Other long term (current) drug therapy: Secondary | ICD-10-CM | POA: Diagnosis not present

## 2020-01-22 DIAGNOSIS — K5900 Constipation, unspecified: Secondary | ICD-10-CM | POA: Diagnosis not present

## 2020-01-22 DIAGNOSIS — Z87891 Personal history of nicotine dependence: Secondary | ICD-10-CM | POA: Diagnosis not present

## 2020-01-22 LAB — CBC WITH DIFFERENTIAL/PLATELET
Abs Immature Granulocytes: 0.1 10*3/uL — ABNORMAL HIGH (ref 0.00–0.07)
Basophils Absolute: 0 10*3/uL (ref 0.0–0.1)
Basophils Relative: 0 %
Eosinophils Absolute: 0 10*3/uL (ref 0.0–0.5)
Eosinophils Relative: 0 %
HCT: 38.3 % — ABNORMAL LOW (ref 39.0–52.0)
Hemoglobin: 14.3 g/dL (ref 13.0–17.0)
Immature Granulocytes: 1 %
Lymphocytes Relative: 12 %
Lymphs Abs: 2 10*3/uL (ref 0.7–4.0)
MCH: 29.8 pg (ref 26.0–34.0)
MCHC: 37.3 g/dL — ABNORMAL HIGH (ref 30.0–36.0)
MCV: 79.8 fL — ABNORMAL LOW (ref 80.0–100.0)
Monocytes Absolute: 1.6 10*3/uL — ABNORMAL HIGH (ref 0.1–1.0)
Monocytes Relative: 10 %
Neutro Abs: 12.7 10*3/uL — ABNORMAL HIGH (ref 1.7–7.7)
Neutrophils Relative %: 77 %
Platelets: 375 10*3/uL (ref 150–400)
RBC: 4.8 MIL/uL (ref 4.22–5.81)
RDW: 13 % (ref 11.5–15.5)
WBC: 16.5 10*3/uL — ABNORMAL HIGH (ref 4.0–10.5)
nRBC: 0 % (ref 0.0–0.2)

## 2020-01-22 LAB — COMPREHENSIVE METABOLIC PANEL
ALT: 25 U/L (ref 0–44)
AST: 26 U/L (ref 15–41)
Albumin: 4.1 g/dL (ref 3.5–5.0)
Alkaline Phosphatase: 70 U/L (ref 38–126)
Anion gap: 12 (ref 5–15)
BUN: 10 mg/dL (ref 6–20)
CO2: 26 mmol/L (ref 22–32)
Calcium: 9.9 mg/dL (ref 8.9–10.3)
Chloride: 99 mmol/L (ref 98–111)
Creatinine, Ser: 0.83 mg/dL (ref 0.61–1.24)
GFR calc Af Amer: >60 mL/min (ref >60)
GFR calc non Af Amer: >60 mL/min (ref >60)
Glucose, Bld: 104 mg/dL — ABNORMAL HIGH (ref 70–99)
Potassium: 3.7 mmol/L (ref 3.5–5.1)
Sodium: 137 mmol/L (ref 135–145)
Total Bilirubin: 0.7 mg/dL (ref 0.3–1.2)
Total Protein: 7.6 g/dL (ref 6.5–8.1)

## 2020-01-22 LAB — LIPASE, BLOOD: Lipase: 21 U/L (ref 11–51)

## 2020-01-22 MED ORDER — POLYETHYLENE GLYCOL 3350 17 G PO PACK: 17.0000 g | PACK | Freq: Every day | ORAL | 0 refills | Status: AC

## 2020-01-22 MED ORDER — POLYETHYLENE GLYCOL 3350 17 G PO PACK: 17.0000 g | PACK | Freq: Every day | ORAL | 0 refills | Status: DC

## 2020-01-22 MED ORDER — SODIUM CHLORIDE 0.9 % IV BOLUS
1000.0000 mL | Freq: Once | INTRAVENOUS | Status: AC
  Administered 2020-01-22: 1000 mL via INTRAVENOUS

## 2020-01-22 MED ORDER — IOHEXOL 300 MG/ML  SOLN
100.0000 mL | Freq: Once | INTRAMUSCULAR | Status: AC | PRN
  Administered 2020-01-22: 100 mL via INTRAVENOUS

## 2020-01-22 MED ORDER — FLEET ENEMA 7-19 GM/118ML RE ENEM
1.0000 | ENEMA | Freq: Once | RECTAL | Status: AC
  Administered 2020-01-22: 1 via RECTAL
  Filled 2020-01-22: qty 1

## 2020-01-22 NOTE — Discharge Instructions (Addendum)
Take Miralax as directed.   As we discussed, your CT scan did not show any signs of bowel obstruction or infection.  They did have some areas that were questionable of a cyst on the liver and the kidney.  As we discussed, this is most likely not the cause of your symptoms but is something that needs to be followed up with your primary care doctor.  Follow-up with [facility] to establish a primary care doctor if you do not have one.   Return to the Emergency Dept  for any worsening abdominal pain, vomiting, fevers or any other worsening or concerning symptoms.
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
## 2020-01-22 NOTE — ED Triage Notes (Signed)
Constipation. He had surgery to have a cyst removed from his scalp. He is taking pain medications that is thinks made him constipations.

## 2020-02-10 ENCOUNTER — Emergency Department (HOSPITAL_BASED_OUTPATIENT_CLINIC_OR_DEPARTMENT_OTHER)
Admission: EM | Admit: 2020-02-10 | Discharge: 2020-02-10 | Disposition: A | Discharge status: Home / Self Care | Payer: BC Managed Care – PPO | Attending: Emergency Medicine | Admitting: Emergency Medicine

## 2020-02-10 ENCOUNTER — Encounter (HOSPITAL_BASED_OUTPATIENT_CLINIC_OR_DEPARTMENT_OTHER): Payer: Self-pay | Admitting: *Deleted

## 2020-02-10 ENCOUNTER — Other Ambulatory Visit: Payer: Self-pay

## 2020-02-10 DIAGNOSIS — R1084 Generalized abdominal pain: Secondary | ICD-10-CM | POA: Diagnosis not present

## 2020-02-10 DIAGNOSIS — Z87891 Personal history of nicotine dependence: Secondary | ICD-10-CM | POA: Insufficient documentation

## 2020-02-10 DIAGNOSIS — R110 Nausea: Secondary | ICD-10-CM | POA: Diagnosis not present

## 2020-02-10 DIAGNOSIS — K5900 Constipation, unspecified: Secondary | ICD-10-CM | POA: Diagnosis present
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
## 2020-02-10 NOTE — Discharge Instructions (Signed)
Take 8 scoops of miralax in 32oz of whatever you would like to drink.(Gatorade comes in this size) You can also use a fleets enema which you can buy over the counter at the pharmacy.  Return for worsening abdominal pain, vomiting or fever. ? ?

## 2020-02-10 NOTE — ED Triage Notes (Signed)
Pt c/o constipation since 5/24.

## 2020-02-11 ENCOUNTER — Encounter: Payer: Self-pay | Admitting: Nurse Practitioner

## 2020-02-27 ENCOUNTER — Emergency Department (HOSPITAL_COMMUNITY): Payer: BC Managed Care – PPO

## 2020-02-27 ENCOUNTER — Inpatient Hospital Stay (HOSPITAL_COMMUNITY)
Admit: 2020-02-27 | Discharge: 2020-02-28 | DRG: 436 | Discharge status: Against Medical Advice | Payer: BC Managed Care – PPO | Attending: Internal Medicine | Admitting: Internal Medicine

## 2020-02-27 DIAGNOSIS — Z972 Presence of dental prosthetic device (complete) (partial): Secondary | ICD-10-CM

## 2020-02-27 DIAGNOSIS — K5900 Constipation, unspecified: Secondary | ICD-10-CM | POA: Diagnosis present

## 2020-02-27 DIAGNOSIS — Z5329 Procedure and treatment not carried out because of patient's decision for other reasons: Secondary | ICD-10-CM | POA: Diagnosis present

## 2020-02-27 DIAGNOSIS — C259 Malignant neoplasm of pancreas, unspecified: Principal | ICD-10-CM | POA: Diagnosis present

## 2020-02-27 DIAGNOSIS — R109 Unspecified abdominal pain: Secondary | ICD-10-CM | POA: Diagnosis present

## 2020-02-27 DIAGNOSIS — R634 Abnormal weight loss: Secondary | ICD-10-CM

## 2020-02-27 DIAGNOSIS — E860 Dehydration: Secondary | ICD-10-CM | POA: Diagnosis not present

## 2020-02-27 DIAGNOSIS — E861 Hypovolemia: Secondary | ICD-10-CM | POA: Diagnosis present

## 2020-02-27 DIAGNOSIS — E871 Hypo-osmolality and hyponatremia: Secondary | ICD-10-CM

## 2020-02-27 DIAGNOSIS — D649 Anemia, unspecified: Secondary | ICD-10-CM

## 2020-02-27 DIAGNOSIS — Z800 Family history of malignant neoplasm of digestive organs: Secondary | ICD-10-CM

## 2020-02-27 DIAGNOSIS — K769 Liver disease, unspecified: Secondary | ICD-10-CM

## 2020-02-27 DIAGNOSIS — Z87891 Personal history of nicotine dependence: Secondary | ICD-10-CM

## 2020-02-27 DIAGNOSIS — R1084 Generalized abdominal pain: Secondary | ICD-10-CM

## 2020-02-27 DIAGNOSIS — C787 Secondary malignant neoplasm of liver and intrahepatic bile duct: Secondary | ICD-10-CM | POA: Diagnosis present

## 2020-02-27 DIAGNOSIS — R17 Unspecified jaundice: Secondary | ICD-10-CM

## 2020-02-27 DIAGNOSIS — R112 Nausea with vomiting, unspecified: Secondary | ICD-10-CM

## 2020-02-27 DIAGNOSIS — Z79899 Other long term (current) drug therapy: Secondary | ICD-10-CM

## 2020-02-27 DIAGNOSIS — K8689 Other specified diseases of pancreas: Secondary | ICD-10-CM

## 2020-02-27 DIAGNOSIS — Z20822 Contact with and (suspected) exposure to covid-19: Secondary | ICD-10-CM | POA: Diagnosis present

## 2020-02-27 LAB — COMPREHENSIVE METABOLIC PANEL
ALT: 16 U/L (ref 0–44)
AST: 26 U/L (ref 15–41)
Albumin: 3.1 g/dL — ABNORMAL LOW (ref 3.5–5.0)
Alkaline Phosphatase: 94 U/L (ref 38–126)
Anion gap: 12 (ref 5–15)
BUN: 14 mg/dL (ref 6–20)
CO2: 27 mmol/L (ref 22–32)
Calcium: 9.4 mg/dL (ref 8.9–10.3)
Chloride: 94 mmol/L — ABNORMAL LOW (ref 98–111)
Creatinine, Ser: 0.97 mg/dL (ref 0.61–1.24)
GFR calc Af Amer: >60 mL/min (ref >60)
GFR calc non Af Amer: >60 mL/min (ref >60)
Glucose, Bld: 162 mg/dL — ABNORMAL HIGH (ref 70–99)
Potassium: 3.8 mmol/L (ref 3.5–5.1)
Sodium: 133 mmol/L — ABNORMAL LOW (ref 135–145)
Total Bilirubin: 0.3 mg/dL (ref 0.3–1.2)
Total Protein: 6.8 g/dL (ref 6.5–8.1)

## 2020-02-27 LAB — CBC
HCT: 28.5 % — ABNORMAL LOW (ref 39.0–52.0)
Hemoglobin: 10.4 g/dL — ABNORMAL LOW (ref 13.0–17.0)
MCH: 29.2 pg (ref 26.0–34.0)
MCHC: 36.5 g/dL — ABNORMAL HIGH (ref 30.0–36.0)
MCV: 80.1 fL (ref 80.0–100.0)
Platelets: 477 10*3/uL — ABNORMAL HIGH (ref 150–400)
RBC: 3.56 MIL/uL — ABNORMAL LOW (ref 4.22–5.81)
RDW: 11.9 % (ref 11.5–15.5)
WBC: 13.8 10*3/uL — ABNORMAL HIGH (ref 4.0–10.5)
nRBC: 0 % (ref 0.0–0.2)

## 2020-02-27 LAB — LIPASE, BLOOD: Lipase: 52 U/L — ABNORMAL HIGH (ref 11–51)

## 2020-02-27 LAB — URINALYSIS, ROUTINE W REFLEX MICROSCOPIC
Bilirubin Urine: NEGATIVE
Glucose, UA: NEGATIVE mg/dL
Hgb urine dipstick: NEGATIVE
Ketones, ur: NEGATIVE mg/dL
Leukocytes,Ua: NEGATIVE
Nitrite: NEGATIVE
Protein, ur: NEGATIVE mg/dL
Specific Gravity, Urine: 1.013 (ref 1.005–1.030)
pH: 7 (ref 5.0–8.0)

## 2020-02-27 MED ORDER — SODIUM CHLORIDE 0.9% FLUSH: 3.0000 mL | Freq: Once | INTRAVENOUS | Status: DC

## 2020-02-27 MED ORDER — SODIUM CHLORIDE 0.9 % IV BOLUS
1000.0000 mL | Freq: Once | INTRAVENOUS | Status: AC
  Administered 2020-02-27: 1000 mL via INTRAVENOUS

## 2020-02-27 MED ORDER — IOHEXOL 300 MG/ML  SOLN
100.0000 mL | Freq: Once | INTRAMUSCULAR | Status: AC | PRN
  Administered 2020-02-27: 100 mL via INTRAVENOUS

## 2020-02-27 NOTE — ED Triage Notes (Signed)
Pt presents today for abd cramping and back pain that has not improved since he was seen in May. States "I just need to poop".  Did fleets enema last night and had watery return.
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
## 2020-02-28 ENCOUNTER — Other Ambulatory Visit: Payer: Self-pay

## 2020-02-28 ENCOUNTER — Encounter (HOSPITAL_COMMUNITY): Payer: Self-pay | Admitting: Internal Medicine

## 2020-02-28 ENCOUNTER — Other Ambulatory Visit: Payer: Self-pay | Admitting: Oncology

## 2020-02-28 ENCOUNTER — Observation Stay (HOSPITAL_COMMUNITY): Payer: BC Managed Care – PPO

## 2020-02-28 DIAGNOSIS — E860 Dehydration: Secondary | ICD-10-CM | POA: Diagnosis present

## 2020-02-28 DIAGNOSIS — E861 Hypovolemia: Secondary | ICD-10-CM | POA: Diagnosis present

## 2020-02-28 DIAGNOSIS — K769 Liver disease, unspecified: Secondary | ICD-10-CM | POA: Diagnosis not present

## 2020-02-28 DIAGNOSIS — R112 Nausea with vomiting, unspecified: Secondary | ICD-10-CM

## 2020-02-28 DIAGNOSIS — C787 Secondary malignant neoplasm of liver and intrahepatic bile duct: Secondary | ICD-10-CM | POA: Diagnosis present

## 2020-02-28 DIAGNOSIS — R17 Unspecified jaundice: Secondary | ICD-10-CM | POA: Diagnosis not present

## 2020-02-28 DIAGNOSIS — E871 Hypo-osmolality and hyponatremia: Secondary | ICD-10-CM

## 2020-02-28 DIAGNOSIS — C259 Malignant neoplasm of pancreas, unspecified: Secondary | ICD-10-CM | POA: Diagnosis present

## 2020-02-28 DIAGNOSIS — R634 Abnormal weight loss: Secondary | ICD-10-CM

## 2020-02-28 DIAGNOSIS — Z5329 Procedure and treatment not carried out because of patient's decision for other reasons: Secondary | ICD-10-CM | POA: Diagnosis present

## 2020-02-28 DIAGNOSIS — D649 Anemia, unspecified: Secondary | ICD-10-CM | POA: Diagnosis present

## 2020-02-28 DIAGNOSIS — R1084 Generalized abdominal pain: Secondary | ICD-10-CM | POA: Diagnosis not present

## 2020-02-28 DIAGNOSIS — K5900 Constipation, unspecified: Secondary | ICD-10-CM | POA: Diagnosis present

## 2020-02-28 DIAGNOSIS — Z20822 Contact with and (suspected) exposure to covid-19: Secondary | ICD-10-CM | POA: Diagnosis present

## 2020-02-28 DIAGNOSIS — R109 Unspecified abdominal pain: Secondary | ICD-10-CM | POA: Diagnosis present

## 2020-02-28 DIAGNOSIS — Z79899 Other long term (current) drug therapy: Secondary | ICD-10-CM | POA: Diagnosis not present

## 2020-02-28 DIAGNOSIS — Z800 Family history of malignant neoplasm of digestive organs: Secondary | ICD-10-CM | POA: Diagnosis not present

## 2020-02-28 DIAGNOSIS — Z87891 Personal history of nicotine dependence: Secondary | ICD-10-CM | POA: Diagnosis not present

## 2020-02-28 DIAGNOSIS — R1013 Epigastric pain: Secondary | ICD-10-CM

## 2020-02-28 DIAGNOSIS — K8689 Other specified diseases of pancreas: Secondary | ICD-10-CM | POA: Diagnosis not present

## 2020-02-28 DIAGNOSIS — Z972 Presence of dental prosthetic device (complete) (partial): Secondary | ICD-10-CM | POA: Diagnosis not present

## 2020-02-28 LAB — COMPREHENSIVE METABOLIC PANEL
ALT: 17 U/L (ref 0–44)
AST: 21 U/L (ref 15–41)
Albumin: 2.9 g/dL — ABNORMAL LOW (ref 3.5–5.0)
Alkaline Phosphatase: 85 U/L (ref 38–126)
Anion gap: 7 (ref 5–15)
BUN: 9 mg/dL (ref 6–20)
CO2: 27 mmol/L (ref 22–32)
Calcium: 9 mg/dL (ref 8.9–10.3)
Chloride: 103 mmol/L (ref 98–111)
Creatinine, Ser: 0.81 mg/dL (ref 0.61–1.24)
GFR calc Af Amer: >60 mL/min (ref >60)
GFR calc non Af Amer: >60 mL/min (ref >60)
Glucose, Bld: 100 mg/dL — ABNORMAL HIGH (ref 70–99)
Potassium: 4 mmol/L (ref 3.5–5.1)
Sodium: 137 mmol/L (ref 135–145)
Total Bilirubin: 0.4 mg/dL (ref 0.3–1.2)
Total Protein: 6.1 g/dL — ABNORMAL LOW (ref 6.5–8.1)

## 2020-02-28 LAB — PROTIME-INR
INR: 1.1 (ref 0.8–1.2)
Prothrombin Time: 13.6 seconds (ref 11.4–15.2)

## 2020-02-28 LAB — HIV ANTIBODY (ROUTINE TESTING W REFLEX): HIV Screen 4th Generation wRfx: NONREACTIVE

## 2020-02-28 LAB — SARS CORONAVIRUS 2 BY RT PCR (HOSPITAL ORDER, PERFORMED IN ~~LOC~~ HOSPITAL LAB): SARS Coronavirus 2: NEGATIVE

## 2020-02-28 LAB — CBC
HCT: 28.3 % — ABNORMAL LOW (ref 39.0–52.0)
Hemoglobin: 10.1 g/dL — ABNORMAL LOW (ref 13.0–17.0)
MCH: 28.9 pg (ref 26.0–34.0)
MCHC: 35.7 g/dL (ref 30.0–36.0)
MCV: 81.1 fL (ref 80.0–100.0)
Platelets: 465 10*3/uL — ABNORMAL HIGH (ref 150–400)
RBC: 3.49 MIL/uL — ABNORMAL LOW (ref 4.22–5.81)
RDW: 12 % (ref 11.5–15.5)
WBC: 12.4 10*3/uL — ABNORMAL HIGH (ref 4.0–10.5)
nRBC: 0 % (ref 0.0–0.2)

## 2020-02-28 LAB — APTT: aPTT: 43 seconds — ABNORMAL HIGH (ref 24–36)

## 2020-02-28 MED ORDER — HYDROMORPHONE HCL 1 MG/ML IJ SOLN
0.5000 mg | INTRAMUSCULAR | Status: DC | PRN
  Administered 2020-02-28 (×2): 1 mg via INTRAVENOUS
  Filled 2020-02-28 (×2): qty 1

## 2020-02-28 MED ORDER — ONDANSETRON HCL 4 MG/2ML IJ SOLN
4.0000 mg | Freq: Once | INTRAMUSCULAR | Status: AC
  Administered 2020-02-28: 4 mg via INTRAVENOUS
  Filled 2020-02-28: qty 2

## 2020-02-28 MED ORDER — HYDROMORPHONE HCL 1 MG/ML IJ SOLN
1.0000 mg | Freq: Once | INTRAMUSCULAR | Status: AC
  Administered 2020-02-28: 1 mg via INTRAVENOUS
  Filled 2020-02-28: qty 1

## 2020-02-28 MED ORDER — ONDANSETRON HCL 4 MG/2ML IJ SOLN: 4.0000 mg | Freq: Four times a day (QID) | INTRAMUSCULAR | Status: DC | PRN

## 2020-02-28 MED ORDER — LACTATED RINGERS IV SOLN: INTRAVENOUS | Status: DC

## 2020-02-28 MED ORDER — POLYETHYLENE GLYCOL 3350 17 G PO PACK: 17.0000 g | PACK | Freq: Every day | ORAL | Status: DC

## 2020-02-28 MED ORDER — PANCRELIPASE (LIP-PROT-AMYL) 12000-38000 UNITS PO CPEP
12000.0000 [IU] | ORAL_CAPSULE | Freq: Three times a day (TID) | ORAL | Status: DC
  Administered 2020-02-28: 12000 [IU] via ORAL
  Filled 2020-02-28 (×2): qty 1

## 2020-02-28 MED ORDER — ACETAMINOPHEN 650 MG RE SUPP: 650.0000 mg | Freq: Four times a day (QID) | RECTAL | Status: DC | PRN

## 2020-02-28 MED ORDER — GADOBUTROL 1 MMOL/ML IV SOLN
7.0000 mL | Freq: Once | INTRAVENOUS | Status: AC | PRN
  Administered 2020-02-28: 7 mL via INTRAVENOUS

## 2020-02-28 MED ORDER — ACETAMINOPHEN 325 MG PO TABS: 650.0000 mg | ORAL_TABLET | Freq: Four times a day (QID) | ORAL | Status: DC | PRN

## 2020-02-28 MED ORDER — HYDROCODONE-ACETAMINOPHEN 5-325 MG PO TABS: 1.0000 | ORAL_TABLET | ORAL | Status: DC | PRN

## 2020-02-28 MED ORDER — ONDANSETRON HCL 4 MG PO TABS: 4.0000 mg | ORAL_TABLET | Freq: Four times a day (QID) | ORAL | Status: DC | PRN

## 2020-02-28 MED ORDER — ENOXAPARIN SODIUM 40 MG/0.4ML ~~LOC~~ SOLN
40.0000 mg | Freq: Every day | SUBCUTANEOUS | Status: DC
  Filled 2020-02-28: qty 0.4

## 2020-02-28 MED ORDER — ADULT MULTIVITAMIN W/MINERALS CH: 1.0000 | ORAL_TABLET | Freq: Every day | ORAL | Status: DC

## 2020-02-28 NOTE — ED Notes (Signed)
Pt asking for food and drink. Informed Caitlynn - RN.
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
## 2020-02-28 NOTE — Progress Notes (Addendum)
Pt states he wants to go home because nothing is being done tonight  Educated pt on plan of care including US biopsy, palliative care, and dietary consult Pt states he wants to be home with family tonight and come back in the morning  Educated pt that I can ask for approval for his spouse to spend the night in the hospital, pt denies  Pt states oncologist informed him that his room will be saved and he can come back in the morning  Educated pt that is not correct and informed of re admission steps  Pt states he wants to leave AMA  Educated pt on risks, pt states he will come back to the ED tomorrow for follow up  Pt requesting prescriptions for pain management and creon Informed primary MD, primary MD aware, no prescriptions given Pt signed AMA form, 2 RNs witnessed  Pt IV removed, catheter intact Pt ambulated off the unit with all belongings

## 2020-02-28 NOTE — ED Notes (Signed)
To mri 
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
## 2020-02-28 NOTE — Progress Notes (Signed)
PROGRESS NOTE    Joe [name]  NWG:[id number] DOB: 1960-09-25 DOA: 02/27/2020 PCP: Patient, No Pcp Per   Brief Narrative:  Joe [name] is a 59 y.o. male with no significant medical history who presents to the ED due to persistent abdominal pain.  He states that he is developed abdominal pain with associated nausea and vomiting that began around 6 weeks ago.  He describes the abdominal pain as severe with no remitting factors.  He is attempted to take ibuprofen as well as tramadol with no significant improvement.  He reports that he has had difficulty sleeping over the last several weeks as well.  His wife is at bedside and reports that he has had significant night sweats over this time that the patient admits to associated weight loss over the last 6 weeks as well.  He reports 25 to 30 pound weight loss over this time.  Of note, the patient did present to the [facility] approximately 5 weeks ago with similar presentation where he had a CT scan of his abdomen pelvis with multiple lesions in the liver with some peripheral enhancement raising concern for insidious lesions.  At that time a liver MRI was recommended as an outpatient however had not yet been completed.  Patient does admit to associated fever and chills over the last several weeks as well.  He denies any chest pain, shortness of breath, dysuria. In the ED the patient was noted to have severe abdominal pain was given IV Dilaudid along with IV fluid.  He then underwent a CT abdomen pelvis with contrast which revealed an ill-defined mass at the head of the pancreas with multiple liver lesions consistent with metastatic disease.  He also had retroperitoneal adenopathy also consistent with metastatic disease.  In the ED he is unable to tolerate any oral intake. Patient is admitted to the hospitalist service for abdominal pain, nausea, vomiting with pancreatic mass concerning for neoplasm with the inability to tolerate p.o.  intake.  Afternoon update - MRCP/MRI consistent with pancreatic adenocarcinoma. Will consult heme-onc although given stage 4 treatment options would likely be somewhat limited.  Assessment & Plan:   Active Problems:   Abdominal pain   Pancreatic mass   Weight loss   Liver lesion   Nausea and vomiting   Hyponatremia   Anemia  Acute on chronic intractable abdominal pain in the setting of pancreatic mass -Patient with associated abdominal pain with finding of pancreatic mass noted on CT scan along with findings concerning for metastatic disease as well - MRI/MRCP pending for further evaluation -Providing IV fluid with LR and providing IV Dilaudid as needed for severe pain.  Hydrocodone for moderate pain and Tylenol for mild pain. -Continue NPO status  Pancreatic mass, unclear etiology - Concerning for neoplasm with metastatic disease. - MRI pancreas for further evaluation pending  Weight loss, secondary to poor PO intake given above -Likely multifactorial from poor oral intake as well as concern for neoplasm. -May need to consult dietitian prior to discharge to determine if he is able to maintain oral intake.  Intractable nausea and vomiting, POA -Providing IV Zofran as needed. -N.p.o. status given above  Hypovolemic hyponatremia, resolving - Continue IV fluids while NPO Lab Results  Component Value Date   NA 137 02/28/2020   K 4.0 02/28/2020   CO2 27 02/28/2020   BUN 9 02/28/2020   CREATININE 0.81 02/28/2020   CALCIUM 9.0 02/28/2020   GLUCOSE 100 (H) 02/28/2020   Anemia, normocytic,  likely iron deficiency vs malnutrition given above -No signs of acute bleeding. -Continue to follow clinically   DVT prophylaxis: Lovenox Code Status: Full Family Communication: None present Status is: Inpatient given ongoing poor PO intake and workup as below  Dispo: The patient is from: Home              Anticipated d/c is to: Home              Anticipated d/c date is: Likely  an additional 24-48h pending clinical course              Patient currently NOT medically stable for discharge given poor PO intake, need for further imaging and workup for pancreatic mass  Consultants:   None  Procedures:   Pending further workup  Antimicrobials:  None indicated   Subjective: No acute issues/events overnight - Nausea/vomiting improved with current control. Denies headache, fevers, chills, chest pain, shortness of breath.  Objective: Vitals:   02/28/20 0557 02/28/20 0558 02/28/20 0559 02/28/20 0600  BP:      Pulse: 81 81 80 79  Resp:      Temp:      TempSrc:      SpO2: 94% 94% 94% 94%    Intake/Output Summary (Last 24 hours) at 02/28/2020 0839 Last data filed at 02/28/2020 0555 Gross per 24 hour  Intake 2000 ml  Output 500 ml  Net 1500 ml   There were no vitals filed for this visit.  Examination:  General exam: Appears calm and comfortable  Respiratory system: Clear to auscultation. Respiratory effort normal. Cardiovascular system: S1 & S2 heard, RRR. No JVD, murmurs, rubs, gallops or clicks. No pedal edema. Gastrointestinal system: Abdomen is nondistended, soft and nontender. No organomegaly or masses felt. Normal bowel sounds heard. Central nervous system: Alert and oriented. No focal neurological deficits. Extremities: Symmetric 5 x 5 power. Skin: No rashes, lesions or ulcers Psychiatry: Judgement and insight appear normal. Mood & affect appropriate.     Data Reviewed: I have personally reviewed following labs and imaging studies  CBC: Recent Labs  Lab 02/27/20 1924 02/28/20 0411  WBC 13.8* 12.4*  HGB 10.4* 10.1*  HCT 28.5* 28.3*  MCV 80.1 81.1  PLT 477* 614*   Basic Metabolic Panel: Recent Labs  Lab 02/27/20 1924 02/28/20 0411  NA 133* 137  K 3.8 4.0  CL 94* 103  CO2 27 27  GLUCOSE 162* 100*  BUN 14 9  CREATININE 0.97 0.81  CALCIUM 9.4 9.0   GFR: CrCl cannot be calculated (Unknown ideal weight.). Liver Function  Tests: Recent Labs  Lab 02/27/20 1924 02/28/20 0411  AST 26 21  ALT 16 17  ALKPHOS 94 85  BILITOT 0.3 0.4  PROT 6.8 6.1*  ALBUMIN 3.1* 2.9*   Recent Labs  Lab 02/27/20 1924  LIPASE 52*   No results for input(s): AMMONIA in the last 168 hours. Coagulation Profile: Recent Labs  Lab 02/28/20 0411  INR 1.1   Cardiac Enzymes: No results for input(s): CKTOTAL, CKMB, CKMBINDEX, TROPONINI in the last 168 hours. BNP (last 3 results) No results for input(s): PROBNP in the last 8760 hours. HbA1C: No results for input(s): HGBA1C in the last 72 hours. CBG: No results for input(s): GLUCAP in the last 168 hours. Lipid Profile: No results for input(s): CHOL, HDL, LDLCALC, TRIG, CHOLHDL, LDLDIRECT in the last 72 hours. Thyroid Function Tests: No results for input(s): TSH, T4TOTAL, FREET4, T3FREE, THYROIDAB in the last 72 hours. Anemia Panel: No results for  input(s): VITAMINB12, FOLATE, FERRITIN, TIBC, IRON, RETICCTPCT in the last 72 hours. Sepsis Labs: No results for input(s): PROCALCITON, LATICACIDVEN in the last 168 hours.  Recent Results (from the past 240 hour(s))  SARS Coronavirus 2 by RT PCR (hospital order, performed in [facility] hospital lab) Nasopharyngeal Nasopharyngeal Swab     Status: None   Collection Time: 02/28/20  3:40 AM   Specimen: Nasopharyngeal Swab  Result Value Ref Range Status   SARS Coronavirus 2 NEGATIVE NEGATIVE Final    Comment: (NOTE) SARS-CoV-2 target nucleic acids are NOT DETECTED.  The SARS-CoV-2 RNA is generally detectable in upper and lower respiratory specimens during the acute phase of infection. The lowest concentration of SARS-CoV-2 viral copies this assay can detect is 250 copies / mL. A negative result does not preclude SARS-CoV-2 infection and should not be used as the sole basis for treatment or other patient management decisions.  A negative result may occur with improper specimen collection / handling, submission of specimen  other than nasopharyngeal swab, presence of viral mutation(s) within the areas targeted by this assay, and inadequate number of viral copies (<250 copies / mL). A negative result must be combined with clinical observations, patient history, and epidemiological information.  Fact Sheet for Patients:   StrictlyIdeas.no  Fact Sheet for Healthcare Providers: BankingDealers.co.za  This test is not yet approved or  cleared by the [place] FDA and has been authorized for detection and/or diagnosis of SARS-CoV-2 by FDA under an Emergency Use Authorization (EUA).  This EUA will remain in effect (meaning this test can be used) for the duration of the COVID-19 declaration under Section 564(b)(1) of the Act, 21 U.S.C. section 360bbb-3(b)(1), unless the authorization is terminated or revoked sooner.  Performed at [facility], [place] [address] [place], [place] [address]          Radiology Studies: CT ABDOMEN PELVIS W CONTRAST  Result Date: 02/28/2020 CLINICAL DATA:  Abdominal pain and constipation EXAM: CT ABDOMEN AND PELVIS WITH CONTRAST TECHNIQUE: Multidetector CT imaging of the abdomen and pelvis was performed using the standard protocol following bolus administration of intravenous contrast. CONTRAST:  158mL OMNIPAQUE IOHEXOL 300 MG/ML  SOLN COMPARISON:  Jan 22, 2020 FINDINGS: Lower chest: The visualized heart size within normal limits. No pericardial fluid/thickening. No hiatal hernia. The visualized portions of the lungs are clear. Hepatobiliary: Multiple peripherally enhancing hypodense liver lesions seen throughout, with interval growth in size and number prior exam. The largest measures 2.6 cm in the left liver lobe. The main portal vein is patent. No evidence of calcified gallstones, gallbladder wall thickening or biliary dilatation. Pancreas: There is an ill-defined hypodense mass seen within the pancreatic head measuring 2.4 x  1.8x2.7 cm. The mass appears to cause mild compression of the adjacent duodenum. The SMV is patent. There is mild pancreatic ductal dilatation in the mid body. Spleen: Normal in size without focal abnormality. Adrenals/Urinary Tract: Both adrenal glands appear normal. Again noted is a 1 cm low-density lesion in the lower pole of the right kidney, likely renal cyst. Bladder is unremarkable. Stomach/Bowel: The stomach and small bowel are unremarkable. There appears to be scattered colonic diverticula present. There is a moderate amount of colonic stool present. Vascular/Lymphatic: There is scattered aortocaval and periaortic lymphadenopathy present. The largest within the left periaortic region measures 1.8 cm, series 3, image 31. A small amount of free fluid seen within the deep pelvis. Atherosclerosis seen at the aorta bi-iliac bifurcation. Reproductive: The prostate is unremarkable. Other: No  evidence of abdominal wall mass or hernia. Musculoskeletal: No acute or significant osseous findings. IMPRESSION: 1. 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 2. New and enlarging extensive hepatic metastases 3. Retroperitoneal adenopathy, consistent with metastatic disease 4. Small amount of free fluid in the deep pelvis. 5. These results were called by telephone at the time of interpretation on 02/28/2020 at 12:07 am to provider Montine Circle , who verbally acknowledged these results. Electronically Signed   By: Prudencio Pair M.D.   On: 02/28/2020 00:13   MR 3D Recon At Scanner  Result Date: 02/28/2020 CLINICAL DATA:  Painless jaundice, weight loss, pancreatic mass and hepatic metastasis on CT EXAM: MRI ABDOMEN WITHOUT AND WITH CONTRAST (INCLUDING MRCP) TECHNIQUE: Multiplanar multisequence MR imaging of the abdomen was performed both before and after the administration of intravenous contrast. Heavily T2-weighted images of the biliary and pancreatic ducts were  obtained, and three-dimensional MRCP images were rendered by post processing. CONTRAST:  31mL GADAVIST GADOBUTROL 1 MMOL/ML IV SOLN COMPARISON:  CT 02/27/2020 FINDINGS: Lower chest:  Lung bases are clear. Hepatobiliary: Multifocal round enhancing lesions consistent with hepatic metastasis. Approximately 20 lesions in the liver ranging size from 1-3 cm. Example lesion in the RIGHT hepatic lobe measures 2.9 cm image 28/18. Example lesion LEFT hepatic lobe measures 2.1 cm on image 31/18. These lesions have a rim enhancing pattern typical metastasis. Gallbladder normal. No biliary duct dilatation. Common bile duct is normal. Pancreas: At the junction of the body and head of the pancreas, there is a hypoenhancing mass measuring 3.0 by 2.2 cm (image 68/18). This lesion has imaging characteristics typical of pancreatic adenocarcinoma. Lesion is free of the superior mesenteric artery. Lesion appears free of the celiac trunk and branches. Lesion appears free of the SMV and the portal vein. Several enlarged periaortic retroperitoneal lymph nodes. One lymph node LEFT of the aorta measures 14 mm on image 59/21 consistent with retroperitoneal nodal metastasis. Spleen: Normal spleen. Adrenals/urinary tract: Adrenal glands and kidneys are normal. Stomach/Bowel: Stomach and limited of the small bowel is unremarkable Vascular/Lymphatic: Abdominal aortic normal caliber. No retroperitoneal periportal lymphadenopathy. Musculoskeletal: No aggressive osseous lesion IMPRESSION: 1. 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 2. Pancreatic mass at the junction of the head and body appear. 3. Multifocal hepatic metastasis. 4. Retroperitoneal nodal metastasis. Electronically Signed   By: Suzy Bouchard M.D.   On: 02/28/2020 04:15   MR ABDOMEN MRCP W WO CONTAST  Result Date: 02/28/2020 CLINICAL DATA:  Painless jaundice, weight loss, pancreatic mass and hepatic metastasis on CT EXAM: MRI ABDOMEN WITHOUT AND  WITH CONTRAST (INCLUDING MRCP) TECHNIQUE: Multiplanar multisequence MR imaging of the abdomen was performed both before and after the administration of intravenous contrast. Heavily T2-weighted images of the biliary and pancreatic ducts were obtained, and three-dimensional MRCP images were rendered by post processing. CONTRAST:  79mL GADAVIST GADOBUTROL 1 MMOL/ML IV SOLN COMPARISON:  CT 02/27/2020 FINDINGS: Lower chest:  Lung bases are clear. Hepatobiliary: Multifocal round enhancing lesions consistent with hepatic metastasis. Approximately 20 lesions in the liver ranging size from 1-3 cm. Example lesion in the RIGHT hepatic lobe measures 2.9 cm image 28/18. Example lesion LEFT hepatic lobe measures 2.1 cm on image 31/18. These lesions have a rim enhancing pattern typical metastasis. Gallbladder normal. No biliary duct dilatation. Common bile duct is normal. Pancreas: At the junction of the body and head of the pancreas, there is a hypoenhancing mass measuring 3.0 by 2.2 cm (image  68/18). This lesion has imaging characteristics typical of pancreatic adenocarcinoma. Lesion is free of the superior mesenteric artery. Lesion appears free of the celiac trunk and branches. Lesion appears free of the SMV and the portal vein. Several enlarged periaortic retroperitoneal lymph nodes. One lymph node LEFT of the aorta measures 14 mm on image 59/21 consistent with retroperitoneal nodal metastasis. Spleen: Normal spleen. Adrenals/urinary tract: Adrenal glands and kidneys are normal. Stomach/Bowel: Stomach and limited of the small bowel is unremarkable Vascular/Lymphatic: Abdominal aortic normal caliber. No retroperitoneal periportal lymphadenopathy. Musculoskeletal: No aggressive osseous lesion IMPRESSION: 1. 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 2. Pancreatic mass at the junction of the head and body appear. 3. Multifocal hepatic metastasis. 4. Retroperitoneal nodal metastasis. Electronically  Signed   By: Suzy Bouchard M.D.   On: 02/28/2020 04:15    Scheduled Meds: . enoxaparin (LOVENOX) injection  40 mg Subcutaneous Daily  . multivitamin with minerals  1 tablet Oral Daily  . polyethylene glycol  17 g Oral Daily  . sodium chloride flush  3 mL Intravenous Once   Continuous Infusions: . lactated ringers 125 mL/hr at 02/28/20 0333     LOS: 0 days   Time spent: 38min  Elah Avellino C Wandell Scullion, DO [facility]  If 7PM-7AM, please contact night-coverage [email or web address]  02/28/2020, 8:39 AM

## 2020-02-28 NOTE — Progress Notes (Signed)
Asked to see this 59 y/o [place] man with working diagnosis of pancreatic CA stage IV. Discussed with patient; entered order for liver Bx, supportive orders. Full note to follow

## 2020-02-28 NOTE — ED Notes (Signed)
abd pain for 3 months he has been everywhere for care

## 2020-02-29 ENCOUNTER — Observation Stay (HOSPITAL_COMMUNITY)
Admission: EM | Admit: 2020-02-29 | Discharge: 2020-03-01 | Disposition: A | Discharge status: Home / Self Care | Payer: BC Managed Care – PPO | Attending: Family Medicine | Admitting: Family Medicine

## 2020-02-29 ENCOUNTER — Encounter (HOSPITAL_COMMUNITY): Payer: Self-pay | Admitting: Emergency Medicine

## 2020-02-29 DIAGNOSIS — Z800 Family history of malignant neoplasm of digestive organs: Secondary | ICD-10-CM | POA: Diagnosis not present

## 2020-02-29 DIAGNOSIS — C259 Malignant neoplasm of pancreas, unspecified: Secondary | ICD-10-CM | POA: Diagnosis not present

## 2020-02-29 DIAGNOSIS — C787 Secondary malignant neoplasm of liver and intrahepatic bile duct: Secondary | ICD-10-CM | POA: Diagnosis not present

## 2020-02-29 DIAGNOSIS — R634 Abnormal weight loss: Secondary | ICD-10-CM | POA: Insufficient documentation

## 2020-02-29 DIAGNOSIS — D649 Anemia, unspecified: Secondary | ICD-10-CM | POA: Diagnosis not present

## 2020-02-29 DIAGNOSIS — C772 Secondary and unspecified malignant neoplasm of intra-abdominal lymph nodes: Secondary | ICD-10-CM | POA: Diagnosis not present

## 2020-02-29 DIAGNOSIS — E871 Hypo-osmolality and hyponatremia: Secondary | ICD-10-CM | POA: Diagnosis not present

## 2020-02-29 DIAGNOSIS — D72829 Elevated white blood cell count, unspecified: Secondary | ICD-10-CM | POA: Diagnosis present

## 2020-02-29 DIAGNOSIS — Z681 Body mass index (BMI) 19 or less, adult: Secondary | ICD-10-CM | POA: Insufficient documentation

## 2020-02-29 DIAGNOSIS — Z87891 Personal history of nicotine dependence: Secondary | ICD-10-CM | POA: Insufficient documentation

## 2020-02-29 LAB — COMPREHENSIVE METABOLIC PANEL
ALT: 19 U/L (ref 0–44)
AST: 22 U/L (ref 15–41)
Albumin: 3.2 g/dL — ABNORMAL LOW (ref 3.5–5.0)
Alkaline Phosphatase: 102 U/L (ref 38–126)
Anion gap: 11 (ref 5–15)
BUN: 9 mg/dL (ref 6–20)
CO2: 26 mmol/L (ref 22–32)
Calcium: 9.8 mg/dL (ref 8.9–10.3)
Chloride: 102 mmol/L (ref 98–111)
Creatinine, Ser: 0.81 mg/dL (ref 0.61–1.24)
GFR calc Af Amer: >60 mL/min (ref >60)
GFR calc non Af Amer: >60 mL/min (ref >60)
Glucose, Bld: 91 mg/dL (ref 70–99)
Potassium: 3.6 mmol/L (ref 3.5–5.1)
Sodium: 139 mmol/L (ref 135–145)
Total Bilirubin: 0.7 mg/dL (ref 0.3–1.2)
Total Protein: 7.1 g/dL (ref 6.5–8.1)

## 2020-02-29 LAB — CBC WITH DIFFERENTIAL/PLATELET
Abs Immature Granulocytes: 0.05 10*3/uL (ref 0.00–0.07)
Basophils Absolute: 0 10*3/uL (ref 0.0–0.1)
Basophils Relative: 0 %
Eosinophils Absolute: 0 10*3/uL (ref 0.0–0.5)
Eosinophils Relative: 0 %
HCT: 31.2 % — ABNORMAL LOW (ref 39.0–52.0)
Hemoglobin: 11.3 g/dL — ABNORMAL LOW (ref 13.0–17.0)
Immature Granulocytes: 0 %
Lymphocytes Relative: 11 %
Lymphs Abs: 1.3 10*3/uL (ref 0.7–4.0)
MCH: 29 pg (ref 26.0–34.0)
MCHC: 36.2 g/dL — ABNORMAL HIGH (ref 30.0–36.0)
MCV: 80.2 fL (ref 80.0–100.0)
Monocytes Absolute: 1.5 10*3/uL — ABNORMAL HIGH (ref 0.1–1.0)
Monocytes Relative: 12 %
Neutro Abs: 9.4 10*3/uL — ABNORMAL HIGH (ref 1.7–7.7)
Neutrophils Relative %: 77 %
Platelets: 503 10*3/uL — ABNORMAL HIGH (ref 150–400)
RBC: 3.89 MIL/uL — ABNORMAL LOW (ref 4.22–5.81)
RDW: 11.9 % (ref 11.5–15.5)
WBC: 12.4 10*3/uL — ABNORMAL HIGH (ref 4.0–10.5)
nRBC: 0 % (ref 0.0–0.2)

## 2020-02-29 LAB — PROTIME-INR
INR: 1.1 (ref 0.8–1.2)
Prothrombin Time: 14.1 seconds (ref 11.4–15.2)

## 2020-02-29 LAB — LIPASE, BLOOD: Lipase: 35 U/L (ref 11–51)

## 2020-02-29 MED ORDER — HYDROMORPHONE HCL 1 MG/ML IJ SOLN
1.0000 mg | Freq: Once | INTRAMUSCULAR | Status: AC
  Administered 2020-02-29: 1 mg via INTRAVENOUS
  Filled 2020-02-29: qty 1

## 2020-02-29 MED ORDER — ALBUTEROL SULFATE (2.5 MG/3ML) 0.083% IN NEBU: 2.5000 mg | INHALATION_SOLUTION | Freq: Four times a day (QID) | RESPIRATORY_TRACT | Status: DC | PRN

## 2020-02-29 MED ORDER — ACETAMINOPHEN 325 MG PO TABS: 650.0000 mg | ORAL_TABLET | Freq: Four times a day (QID) | ORAL | Status: DC | PRN

## 2020-02-29 MED ORDER — ONDANSETRON HCL 4 MG/2ML IJ SOLN: 4.0000 mg | Freq: Four times a day (QID) | INTRAMUSCULAR | Status: DC | PRN

## 2020-02-29 MED ORDER — SODIUM CHLORIDE 0.9 % IV BOLUS
1000.0000 mL | Freq: Once | INTRAVENOUS | Status: AC
  Administered 2020-02-29: 1000 mL via INTRAVENOUS

## 2020-02-29 MED ORDER — ENOXAPARIN SODIUM 40 MG/0.4ML ~~LOC~~ SOLN: 40.0000 mg | SUBCUTANEOUS | Status: DC

## 2020-02-29 MED ORDER — ACETAMINOPHEN 650 MG RE SUPP: 650.0000 mg | Freq: Four times a day (QID) | RECTAL | Status: DC | PRN

## 2020-02-29 MED ORDER — ONDANSETRON HCL 4 MG PO TABS: 4.0000 mg | ORAL_TABLET | Freq: Four times a day (QID) | ORAL | Status: DC | PRN

## 2020-02-29 MED ORDER — PANCRELIPASE (LIP-PROT-AMYL) 12000-38000 UNITS PO CPEP
12000.0000 [IU] | ORAL_CAPSULE | Freq: Three times a day (TID) | ORAL | Status: DC
  Administered 2020-02-29 – 2020-03-01 (×2): 12000 [IU] via ORAL
  Filled 2020-02-29 (×3): qty 1

## 2020-02-29 MED ORDER — SODIUM CHLORIDE 0.9 % IV SOLN: INTRAVENOUS | Status: DC

## 2020-02-29 MED ORDER — HYDROCODONE-ACETAMINOPHEN 5-325 MG PO TABS
1.0000 | ORAL_TABLET | Freq: Four times a day (QID) | ORAL | Status: DC | PRN
  Administered 2020-02-29 – 2020-03-01 (×2): 1 via ORAL
  Filled 2020-02-29 (×2): qty 1

## 2020-02-29 MED ORDER — HYDROMORPHONE HCL 1 MG/ML IJ SOLN
1.0000 mg | INTRAMUSCULAR | Status: DC | PRN
  Administered 2020-02-29 – 2020-03-01 (×3): 1 mg via INTRAVENOUS
  Filled 2020-02-29 (×3): qty 1

## 2020-02-29 MED ORDER — SODIUM CHLORIDE 0.9% FLUSH: 3.0000 mL | Freq: Two times a day (BID) | INTRAVENOUS | Status: DC

## 2020-02-29 MED ORDER — ONDANSETRON HCL 4 MG/2ML IJ SOLN
4.0000 mg | Freq: Once | INTRAMUSCULAR | Status: AC
  Administered 2020-02-29: 4 mg via INTRAVENOUS
  Filled 2020-02-29: qty 2

## 2020-02-29 NOTE — ED Notes (Signed)
Dinner tray ordered.

## 2020-02-29 NOTE — ED Triage Notes (Signed)
Pt stated, Im suppose to have a biopsy this morning. It was schedule for yesterday and because it was Fathers Day I went home.

## 2020-02-29 NOTE — Discharge Summary (Signed)
Physician Discharge Summary  CHANOCH MCCLEERY CLE:[id number] DOB: 04-10-1961 DOA: 02/27/2020  PCP: No primary care provider on file.  Admit date: 02/27/2020 Discharge date: 02/29/2020  PATIENT LEFT OUR FACILITY AGAINST MEDICAL ADVICE DESPITE ONGOING DISCUSSION THAT HE NEEDS FURTHER IMAGING AND MEDICAL MANAGEMENT OVER THE NEXT 24H   Discharge Diagnoses:  Active Problems:   Abdominal pain   Pancreatic mass   Weight loss   Liver lesion   Nausea and vomiting   Hyponatremia   Anemia    Discharge Instructions   Allergies as of 02/28/2020   No Known Allergies     Medication List    ASK your doctor about these medications   acetaminophen 325 MG tablet Commonly known as: TYLENOL Take 650 mg by mouth 2 (two) times daily as needed for mild pain.   ALKA-SELTZER ANTACID PO Take 1-2 tablets by mouth as needed (Indigestion).   traMADol 50 MG tablet Commonly known as: ULTRAM Take 1 tablet (50 mg total) by mouth every 6 (six) hours as needed for severe pain.       No Known Allergies  Consultations:  Oncology   Procedures/Studies: CT ABDOMEN PELVIS W CONTRAST  Result Date: 02/28/2020 CLINICAL DATA:  Abdominal pain and constipation EXAM: CT ABDOMEN AND PELVIS WITH CONTRAST TECHNIQUE: Multidetector CT imaging of the abdomen and pelvis was performed using the standard protocol following bolus administration of intravenous contrast. CONTRAST:  193mL OMNIPAQUE IOHEXOL 300 MG/ML  SOLN COMPARISON:  Jan 22, 2020 FINDINGS: Lower chest: The visualized heart size within normal limits. No pericardial fluid/thickening. No hiatal hernia. The visualized portions of the lungs are clear. Hepatobiliary: Multiple peripherally enhancing hypodense liver lesions seen throughout, with interval growth in size and number prior exam. The largest measures 2.6 cm in the left liver lobe. The main portal vein is patent. No evidence of calcified gallstones, gallbladder wall thickening or biliary dilatation.  Pancreas: There is an ill-defined hypodense mass seen within the pancreatic head measuring 2.4 x 1.8x2.7 cm. The mass appears to cause mild compression of the adjacent duodenum. The SMV is patent. There is mild pancreatic ductal dilatation in the mid body. Spleen: Normal in size without focal abnormality. Adrenals/Urinary Tract: Both adrenal glands appear normal. Again noted is a 1 cm low-density lesion in the lower pole of the right kidney, likely renal cyst. Bladder is unremarkable. Stomach/Bowel: The stomach and small bowel are unremarkable. There appears to be scattered colonic diverticula present. There is a moderate amount of colonic stool present. Vascular/Lymphatic: There is scattered aortocaval and periaortic lymphadenopathy present. The largest within the left periaortic region measures 1.8 cm, series 3, image 31. A small amount of free fluid seen within the deep pelvis. Atherosclerosis seen at the aorta bi-iliac bifurcation. Reproductive: The prostate is unremarkable. Other: No evidence of abdominal wall mass or hernia. Musculoskeletal: No acute or significant osseous findings. IMPRESSION: 1. 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 2. New and enlarging extensive hepatic metastases 3. Retroperitoneal adenopathy, consistent with metastatic disease 4. Small amount of free fluid in the deep pelvis. 5. These results were called by telephone at the time of interpretation on 02/28/2020 at 12:07 am to provider Montine Circle , who verbally acknowledged these results. Electronically Signed   By: Prudencio Pair M.D.   On: 02/28/2020 00:13   MR 3D Recon At Scanner  Result Date: 02/28/2020 CLINICAL DATA:  Painless jaundice, weight loss, pancreatic mass and hepatic metastasis on CT EXAM: MRI ABDOMEN WITHOUT AND  WITH CONTRAST (INCLUDING MRCP) TECHNIQUE: Multiplanar multisequence MR imaging of the abdomen was performed both before and after the  administration of intravenous contrast. Heavily T2-weighted images of the biliary and pancreatic ducts were obtained, and three-dimensional MRCP images were rendered by post processing. CONTRAST:  71mL GADAVIST GADOBUTROL 1 MMOL/ML IV SOLN COMPARISON:  CT 02/27/2020 FINDINGS: Lower chest:  Lung bases are clear. Hepatobiliary: Multifocal round enhancing lesions consistent with hepatic metastasis. Approximately 20 lesions in the liver ranging size from 1-3 cm. Example lesion in the RIGHT hepatic lobe measures 2.9 cm image 28/18. Example lesion LEFT hepatic lobe measures 2.1 cm on image 31/18. These lesions have a rim enhancing pattern typical metastasis. Gallbladder normal. No biliary duct dilatation. Common bile duct is normal. Pancreas: At the junction of the body and head of the pancreas, there is a hypoenhancing mass measuring 3.0 by 2.2 cm (image 68/18). This lesion has imaging characteristics typical of pancreatic adenocarcinoma. Lesion is free of the superior mesenteric artery. Lesion appears free of the celiac trunk and branches. Lesion appears free of the SMV and the portal vein. Several enlarged periaortic retroperitoneal lymph nodes. One lymph node LEFT of the aorta measures 14 mm on image 59/21 consistent with retroperitoneal nodal metastasis. Spleen: Normal spleen. Adrenals/urinary tract: Adrenal glands and kidneys are normal. Stomach/Bowel: Stomach and limited of the small bowel is unremarkable Vascular/Lymphatic: Abdominal aortic normal caliber. No retroperitoneal periportal lymphadenopathy. Musculoskeletal: No aggressive osseous lesion IMPRESSION: 1. 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 2. Pancreatic mass at the junction of the head and body appear. 3. Multifocal hepatic metastasis. 4. Retroperitoneal nodal metastasis. Electronically Signed   By: Suzy Bouchard M.D.   On: 02/28/2020 04:15   MR ABDOMEN MRCP W WO CONTAST  Result Date: 02/28/2020 CLINICAL DATA:   Painless jaundice, weight loss, pancreatic mass and hepatic metastasis on CT EXAM: MRI ABDOMEN WITHOUT AND WITH CONTRAST (INCLUDING MRCP) TECHNIQUE: Multiplanar multisequence MR imaging of the abdomen was performed both before and after the administration of intravenous contrast. Heavily T2-weighted images of the biliary and pancreatic ducts were obtained, and three-dimensional MRCP images were rendered by post processing. CONTRAST:  33mL GADAVIST GADOBUTROL 1 MMOL/ML IV SOLN COMPARISON:  CT 02/27/2020 FINDINGS: Lower chest:  Lung bases are clear. Hepatobiliary: Multifocal round enhancing lesions consistent with hepatic metastasis. Approximately 20 lesions in the liver ranging size from 1-3 cm. Example lesion in the RIGHT hepatic lobe measures 2.9 cm image 28/18. Example lesion LEFT hepatic lobe measures 2.1 cm on image 31/18. These lesions have a rim enhancing pattern typical metastasis. Gallbladder normal. No biliary duct dilatation. Common bile duct is normal. Pancreas: At the junction of the body and head of the pancreas, there is a hypoenhancing mass measuring 3.0 by 2.2 cm (image 68/18). This lesion has imaging characteristics typical of pancreatic adenocarcinoma. Lesion is free of the superior mesenteric artery. Lesion appears free of the celiac trunk and branches. Lesion appears free of the SMV and the portal vein. Several enlarged periaortic retroperitoneal lymph nodes. One lymph node LEFT of the aorta measures 14 mm on image 59/21 consistent with retroperitoneal nodal metastasis. Spleen: Normal spleen. Adrenals/urinary tract: Adrenal glands and kidneys are normal. Stomach/Bowel: Stomach and limited of the small bowel is unremarkable Vascular/Lymphatic: Abdominal aortic normal caliber. No retroperitoneal periportal lymphadenopathy. Musculoskeletal: No aggressive osseous lesion IMPRESSION: 1. 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 2. Pancreatic mass at the junction of the  head and body appear. 3. Multifocal hepatic metastasis. 4. Retroperitoneal  nodal metastasis. Electronically Signed   By: Suzy Bouchard M.D.   On: 02/28/2020 04:15      Discharge Exam: Vitals:   02/28/20 1405 02/28/20 1519  BP: (!) 148/95 (!) 150/100  Pulse:  88  Resp:  18  Temp:  97.9 F (36.6 C)  SpO2:  98%   Vitals:   02/28/20 1355 02/28/20 1405 02/28/20 1519 02/28/20 1658  BP:  (!) 148/95 (!) 150/100   Pulse: (!) 105  88   Resp:   18   Temp:   97.9 F (36.6 C)   TempSrc:   Axillary   SpO2: 96%  98%   Height:    6\' 1"  (1.854 m)     The results of significant diagnostics from this hospitalization (including imaging, microbiology, ancillary and laboratory) are listed below for reference.     Microbiology: Recent Results (from the past 240 hour(s))  SARS Coronavirus 2 by RT PCR (hospital order, performed in [facility] hospital lab) Nasopharyngeal Nasopharyngeal Swab     Status: None   Collection Time: 02/28/20  3:40 AM   Specimen: Nasopharyngeal Swab  Result Value Ref Range Status   SARS Coronavirus 2 NEGATIVE NEGATIVE Final    Comment: (NOTE) SARS-CoV-2 target nucleic acids are NOT DETECTED.  The SARS-CoV-2 RNA is generally detectable in upper and lower respiratory specimens during the acute phase of infection. The lowest concentration of SARS-CoV-2 viral copies this assay can detect is 250 copies / mL. A negative result does not preclude SARS-CoV-2 infection and should not be used as the sole basis for treatment or other patient management decisions.  A negative result may occur with improper specimen collection / handling, submission of specimen other than nasopharyngeal swab, presence of viral mutation(s) within the areas targeted by this assay, and inadequate number of viral copies (<250 copies / mL). A negative result must be combined with clinical observations, patient history, and epidemiological information.  Fact Sheet for Patients:    StrictlyIdeas.no  Fact Sheet for Healthcare Providers: BankingDealers.co.za  This test is not yet approved or  cleared by the [place] FDA and has been authorized for detection and/or diagnosis of SARS-CoV-2 by FDA under an Emergency Use Authorization (EUA).  This EUA will remain in effect (meaning this test can be used) for the duration of the COVID-19 declaration under Section 564(b)(1) of the Act, 21 U.S.C. section 360bbb-3(b)(1), unless the authorization is terminated or revoked sooner.  Performed at [facility], [place] [address], [place],  [address]      Labs: BNP (last 3 results) No results for input(s): BNP in the last 8760 hours. Basic Metabolic Panel: Recent Labs  Lab 02/27/20 1924 02/28/20 0411  NA 133* 137  K 3.8 4.0  CL 94* 103  CO2 27 27  GLUCOSE 162* 100*  BUN 14 9  CREATININE 0.97 0.81  CALCIUM 9.4 9.0   Liver Function Tests: Recent Labs  Lab 02/27/20 1924 02/28/20 0411  AST 26 21  ALT 16 17  ALKPHOS 94 85  BILITOT 0.3 0.4  PROT 6.8 6.1*  ALBUMIN 3.1* 2.9*   Recent Labs  Lab 02/27/20 1924  LIPASE 52*   No results for input(s): AMMONIA in the last 168 hours. CBC: Recent Labs  Lab 02/27/20 1924 02/28/20 0411  WBC 13.8* 12.4*  HGB 10.4* 10.1*  HCT 28.5* 28.3*  MCV 80.1 81.1  PLT 477* 465*   Cardiac Enzymes: No results for input(s): CKTOTAL, CKMB, CKMBINDEX, TROPONINI in the last 168 hours.  BNP: Invalid input(s): POCBNP CBG: No results for input(s): GLUCAP in the last 168 hours. D-Dimer No results for input(s): DDIMER in the last 72 hours. Hgb A1c No results for input(s): HGBA1C in the last 72 hours. Lipid Profile No results for input(s): CHOL, HDL, LDLCALC, TRIG, CHOLHDL, LDLDIRECT in the last 72 hours. Thyroid function studies No results for input(s): TSH, T4TOTAL, T3FREE, THYROIDAB in the last 72 hours.  Invalid input(s): FREET3 Anemia work up No results for  input(s): VITAMINB12, FOLATE, FERRITIN, TIBC, IRON, RETICCTPCT in the last 72 hours. Urinalysis    Component Value Date/Time   COLORURINE YELLOW 02/27/2020 1914   APPEARANCEUR CLEAR 02/27/2020 1914   LABSPEC 1.013 02/27/2020 1914   PHURINE 7.0 02/27/2020 [place] NEGATIVE 02/27/2020 1914   HGBUR NEGATIVE 02/27/2020 [place] NEGATIVE 02/27/2020 [place] NEGATIVE 02/27/2020 1914   PROTEINUR NEGATIVE 02/27/2020 1914   NITRITE NEGATIVE 02/27/2020 1914   LEUKOCYTESUR NEGATIVE 02/27/2020 1914   Sepsis Labs Invalid input(s): PROCALCITONIN,  WBC,  LACTICIDVEN Microbiology Recent Results (from the past 240 hour(s))  SARS Coronavirus 2 by RT PCR (hospital order, performed in [place] hospital lab) Nasopharyngeal Nasopharyngeal Swab     Status: None   Collection Time: 02/28/20  3:40 AM   Specimen: Nasopharyngeal Swab  Result Value Ref Range Status   SARS Coronavirus 2 NEGATIVE NEGATIVE Final    Comment: (NOTE) SARS-CoV-2 target nucleic acids are NOT DETECTED.  The SARS-CoV-2 RNA is generally detectable in upper and lower respiratory specimens during the acute phase of infection. The lowest concentration of SARS-CoV-2 viral copies this assay can detect is 250 copies / mL. A negative result does not preclude SARS-CoV-2 infection and should not be used as the sole basis for treatment or other patient management decisions.  A negative result may occur with improper specimen collection / handling, submission of specimen other than nasopharyngeal swab, presence of viral mutation(s) within the areas targeted by this assay, and inadequate number of viral copies (<250 copies / mL). A negative result must be combined with clinical observations, patient history, and epidemiological information.  Fact Sheet for Patients:   StrictlyIdeas.no  Fact Sheet for Healthcare Providers: BankingDealers.co.za  This test is not yet  approved or  cleared by the [place] FDA and has been authorized for detection and/or diagnosis of SARS-CoV-2 by FDA under an Emergency Use Authorization (EUA).  This EUA will remain in effect (meaning this test can be used) for the duration of the COVID-19 declaration under Section 564(b)(1) of the Act, 21 U.S.C. section 360bbb-3(b)(1), unless the authorization is terminated or revoked sooner.  Performed at [facility], [place] [address], [place], [place] [address]      Time coordinating discharge: Over 30 minutes  SIGNED:   Little Ishikawa, DO [facility] 02/29/2020, 8:07 AM Pager   If 7PM-7AM, please contact night-coverage [email or web address]

## 2020-02-29 NOTE — Plan of Care (Signed)
  Problem: Education: Goal: Knowledge of General Education information will improve Description Including pain rating scale, medication(s)/side effects and non-pharmacologic comfort measures Outcome: Progressing   

## 2020-02-29 NOTE — ED Triage Notes (Signed)
Pt. Stated, Ive had stamch pain since May 6. Ive had scans and all. I was told to come back here.
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
## 2020-03-01 ENCOUNTER — Observation Stay (HOSPITAL_COMMUNITY): Payer: BC Managed Care – PPO

## 2020-03-01 ENCOUNTER — Other Ambulatory Visit: Payer: Self-pay

## 2020-03-01 ENCOUNTER — Telehealth: Payer: Self-pay

## 2020-03-01 DIAGNOSIS — C259 Malignant neoplasm of pancreas, unspecified: Secondary | ICD-10-CM | POA: Diagnosis not present

## 2020-03-01 HISTORY — PX: IR US GUIDE BX ASP/DRAIN: IMG2392

## 2020-03-01 LAB — CBC
HCT: 29.5 % — ABNORMAL LOW (ref 39.0–52.0)
Hemoglobin: 10.4 g/dL — ABNORMAL LOW (ref 13.0–17.0)
MCH: 28.3 pg (ref 26.0–34.0)
MCHC: 35.3 g/dL (ref 30.0–36.0)
MCV: 80.4 fL (ref 80.0–100.0)
Platelets: 448 10*3/uL — ABNORMAL HIGH (ref 150–400)
RBC: 3.67 MIL/uL — ABNORMAL LOW (ref 4.22–5.81)
RDW: 11.8 % (ref 11.5–15.5)
WBC: 12.7 10*3/uL — ABNORMAL HIGH (ref 4.0–10.5)
nRBC: 0 % (ref 0.0–0.2)

## 2020-03-01 MED ORDER — FENTANYL CITRATE (PF) 100 MCG/2ML IJ SOLN
INTRAMUSCULAR | Status: AC
  Filled 2020-03-01: qty 2

## 2020-03-01 MED ORDER — ENOXAPARIN SODIUM 40 MG/0.4ML ~~LOC~~ SOLN: 40.0000 mg | SUBCUTANEOUS | Status: DC

## 2020-03-01 MED ORDER — HYDROCODONE-ACETAMINOPHEN 5-325 MG PO TABS: 1.0000 | ORAL_TABLET | Freq: Four times a day (QID) | ORAL | 0 refills | Status: DC | PRN

## 2020-03-01 MED ORDER — PANCRELIPASE (LIP-PROT-AMYL) 36000-114000 UNITS PO CPEP
ORAL_CAPSULE | ORAL | 11 refills | Status: DC
Start: 2020-03-01 — End: 2020-04-07

## 2020-03-01 MED ORDER — PROMETHAZINE HCL 25 MG PO TABS
25.0000 mg | ORAL_TABLET | Freq: Four times a day (QID) | ORAL | 2 refills | Status: DC | PRN
Start: 2020-03-01 — End: 2020-04-28

## 2020-03-01 MED ORDER — POLYETHYLENE GLYCOL 3350 17 G PO PACK: 17.0000 g | PACK | Freq: Two times a day (BID) | ORAL | 1 refills | Status: DC

## 2020-03-01 MED ORDER — FENTANYL CITRATE (PF) 100 MCG/2ML IJ SOLN
INTRAMUSCULAR | Status: AC | PRN
  Administered 2020-03-01: 50 ug via INTRAVENOUS

## 2020-03-01 MED ORDER — LIDOCAINE HCL 1 % IJ SOLN
INTRAMUSCULAR | Status: AC
  Filled 2020-03-01: qty 20

## 2020-03-01 MED ORDER — SENNOSIDES-DOCUSATE SODIUM 8.6-50 MG PO TABS
1.0000 | ORAL_TABLET | Freq: Every day | ORAL | 1 refills | Status: DC
Start: 2020-03-01 — End: 2020-04-07

## 2020-03-01 MED ORDER — MIDAZOLAM HCL 2 MG/2ML IJ SOLN
INTRAMUSCULAR | Status: AC
  Filled 2020-03-01: qty 2

## 2020-03-01 MED ORDER — GELATIN ABSORBABLE 12-7 MM EX MISC
CUTANEOUS | Status: AC
  Filled 2020-03-01: qty 1

## 2020-03-01 MED ORDER — MIDAZOLAM HCL 2 MG/2ML IJ SOLN
INTRAMUSCULAR | Status: AC | PRN
  Administered 2020-03-01: 1 mg via INTRAVENOUS
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
## 2020-03-01 NOTE — TOC Transition Note (Signed)
Transition of Care [facility]) - CM/SW Discharge Note   Patient Details  Name: Joe [name] MRN: [id number] Date of Birth: 1961-05-21  Transition of Care [facility] CM/SW Contact:  Carles Collet, RN Phone Number: 03/01/2020, 2:54 PM   Clinical Narrative:    Patient w order to DC home. Terminal diagnosis stage 4 CA qualifies him for home hospice/ home palliative services. This is a new diagnosis for him, and I would like for him to have resources immediately going forward.  Patient is functionally independent, so approached him about home palliative services to assist with managing pain medications and optimizing quality of life. He was agreeable and appreciative of recommendation.  Referral placed to [facility] for home palliative services with likelihood of transitioning into home hospice services in the next few months.       Final next level of care: Home/Self Care Barriers to Discharge: No Barriers Identified   Patient Goals and CMS Choice        Discharge Placement                       Discharge Plan and Services                            [facility] Agency: Hospice and [place] Date [facility] Agency Contacted: 03/01/20 Time [place]: [place] Representative spoke with at [place]: Audrea Muscat  Social Determinants of Health ([place]) Interventions     Readmission Risk Interventions No flowsheet data found.

## 2020-03-01 NOTE — Telephone Encounter (Signed)
Marzetta Board from [place] [facility] called to speak with MD this patient is being discharged from the hospital with palliative care  MD made aware

## 2020-03-01 NOTE — Progress Notes (Signed)
Joe [name] to be D/C'd per MD order. Discussed with the patient and all questions fully answered. ? VSS, Skin clean, dry and intact without evidence of skin break down, no evidence of skin tears noted. ? IV catheter discontinued intact. Site without signs and symptoms of complications. Dressing and pressure applied. ? An After Visit Summary was printed and given to the patient. Patient informed where to pickup prescriptions. ? D/C education completed with patient/family including follow up instructions, medication list, d/c activities limitations if indicated, with other d/c instructions as indicated by MD - patient able to verbalize understanding, all questions fully answered.  ? Patient instructed to return to ED, call 911, or call MD for any changes in condition.  ? Patient to be escorted via [place], and D/C home via private auto.

## 2020-03-01 NOTE — Discharge Summary (Signed)
Physician Discharge Summary  Joe [name] DJT:[id number] DOB: 03-25-1961 DOA: 02/29/2020  PCP: Patient, No Pcp Per  Admit date: 02/29/2020 Discharge date: 03/01/2020  Admitted From: home Disposition:  home  Recommendations for Outpatient Follow-up:  1. Follow up with PCP in 1-2 weeks 2. Please obtain biopsy results and await cancer center call for next appointment. 3. Please follow up on the following pending results:  Home Health: No Equipment/Devices:None   Discharge Condition: Stable CODE STATUS: Full code Diet recommendation: regular  Brief/Interim Summary: Joe [name] is a 59 y.o. male with medical history significant of stage IV pancreatic cancer diagnosed 2 days ago.  He had left AGAINST MEDICAL ADVICE yesterday because he wanted to be at home with his family to spend Father's Day.  He reports that he was told that his cancer was terminal and that is why wanted to leave.  However, patient came back today disease had continued severe abdominal pain with radiation to his back and nausea.  He had not eaten anything in hopes of being able to get the liver biopsy for a definitive diagnosis that had originally been scheduled for today prior to the patient leaving the hospital.  Ultimately the patient's goals are to be able to continue eating, have bowel movements, and manage his pain as a comes.  His last bowel movement was 3 days ago, but he reports still being able to pass flatus.  He has lost anywhere from 25 to 30 pounds since onset of symptoms 1 month ago.  Denies having any vomiting since being home and previously forced himself to vomit because the severity of the nausea without any improvement in symptoms.  He gives history of his father having pancreatic cancer as well passing away at the age of 26.  Patient gives account of morphine caused his father not to eat and therefore he does not want to be on anything with morphine.  Discharge Diagnoses:  Principal Problem:    Pancreatic cancer ([place]) Active Problems:   Anemia   Leukocytosis  Stage IV pancreatic adenocarcinoma: Patient originally presented with complaints of abdominal pain over the last 1 month with reports of weight loss of 30 pounds and.  CT scan along with MRI/MRCP consistent with metastatic pancreatic cancer.  Oncology had been formally consulted and recommended liver biopsy for tissue diagnosis.  Patient reports that his goals are to continue eating, have regular bowel movements, and manage pain. -Admit to a MedSurg bed -N.p.o. after midnight for ultrasound-guided biopsy on 6/22--done and pending at time of discharge -Normal saline IV fluids at 75 mL/h--complete -Hydrocodone as needed for moderate pain, and Dilaudid IV as needed for severe pain -Antiemetics as needed for nausea -Pancreatic enzymes with meals -Palliative care consulted for symptom management, will follow-up for further recommendations--see note -Discussed with Dr. [name] who recommended obtaining ultrasound guided biopsy of the liver and to formally consult them if needed.  Otherwise they will set up the patient with outpatient follow-up  Leukocytosis: Stable.  WBC remains 12.4 with no clear signs of infection appreciated. -Continue to monitor  Normocytic anemia: Stable.  Patient denies any reports of bleeding. -Continue to monitor  Discharge Instructions:  Discharge Instructions    Call MD for:  difficulty breathing, headache or visual disturbances   Complete by: As directed    Call MD for:  persistant nausea and vomiting   Complete by: As directed    Call MD for:  severe uncontrolled pain   Complete by: As directed  Call MD for:  temperature >100.4   Complete by: As directed    Diet - low sodium heart healthy   Complete by: As directed    Increase activity slowly   Complete by: As directed    No wound care   Complete by: As directed      Allergies as of 03/01/2020   No Known Allergies     Medication  List    STOP taking these medications   acetaminophen 325 MG tablet Commonly known as: TYLENOL   traMADol 50 MG tablet Commonly known as: ULTRAM     TAKE these medications   ALKA-SELTZER ANTACID PO Take 1-2 tablets by mouth as needed (Indigestion).   HYDROcodone-acetaminophen 5-325 MG tablet Commonly known as: NORCO/VICODIN Take 1 tablet by mouth every 6 (six) hours as needed for moderate pain.   lipase/protease/amylase 36000 UNITS Cpep capsule Commonly known as: Creon Take 2 capsules (72,000 Units total) by mouth 3 (three) times daily with meals. May also take 1 capsule (36,000 Units total) as needed (with snacks).   polyethylene glycol 17 g packet Commonly known as: MIRALAX / GLYCOLAX Take 17 g by mouth 2 (two) times daily. 2-3 scoops   promethazine 25 MG tablet Commonly known as: PHENERGAN Take 1 tablet (25 mg total) by mouth every 6 (six) hours as needed for nausea.   senna-docusate 8.6-50 MG tablet Commonly known as: Senokot-S Take 1 tablet by mouth daily.       Follow-up Information    [facility] .        Magrinat, Virgie Dad, MD Follow up.   Specialty: Oncology Contact information: [address] [place] [address] [phone]              No Known Allergies  Consultations:  None   Procedures/Studies: CT ABDOMEN PELVIS W CONTRAST  Result Date: 02/28/2020 CLINICAL DATA:  Abdominal pain and constipation EXAM: CT ABDOMEN AND PELVIS WITH CONTRAST TECHNIQUE: Multidetector CT imaging of the abdomen and pelvis was performed using the standard protocol following bolus administration of intravenous contrast. CONTRAST:  174mL OMNIPAQUE IOHEXOL 300 MG/ML  SOLN COMPARISON:  Jan 22, 2020 FINDINGS: Lower chest: The visualized heart size within normal limits. No pericardial fluid/thickening. No hiatal hernia. The visualized portions of the lungs are clear. Hepatobiliary: Multiple peripherally enhancing hypodense liver lesions seen  throughout, with interval growth in size and number prior exam. The largest measures 2.6 cm in the left liver lobe. The main portal vein is patent. No evidence of calcified gallstones, gallbladder wall thickening or biliary dilatation. Pancreas: There is an ill-defined hypodense mass seen within the pancreatic head measuring 2.4 x 1.8x2.7 cm. The mass appears to cause mild compression of the adjacent duodenum. The SMV is patent. There is mild pancreatic ductal dilatation in the mid body. Spleen: Normal in size without focal abnormality. Adrenals/Urinary Tract: Both adrenal glands appear normal. Again noted is a 1 cm low-density lesion in the lower pole of the right kidney, likely renal cyst. Bladder is unremarkable. Stomach/Bowel: The stomach and small bowel are unremarkable. There appears to be scattered colonic diverticula present. There is a moderate amount of colonic stool present. Vascular/Lymphatic: There is scattered aortocaval and periaortic lymphadenopathy present. The largest within the left periaortic region measures 1.8 cm, series 3, image 31. A small amount of free fluid seen within the deep pelvis. Atherosclerosis seen at the aorta bi-iliac bifurcation. Reproductive: The prostate is unremarkable. Other: No evidence of abdominal wall mass or hernia. Musculoskeletal: No  acute or significant osseous findings. IMPRESSION: 1. 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 2. New and enlarging extensive hepatic metastases 3. Retroperitoneal adenopathy, consistent with metastatic disease 4. Small amount of free fluid in the deep pelvis. 5. These results were called by telephone at the time of interpretation on 02/28/2020 at 12:07 am to provider Montine Circle , who verbally acknowledged these results. Electronically Signed   By: Prudencio Pair M.D.   On: 02/28/2020 00:13   MR 3D Recon At Scanner  Result Date: 02/28/2020 CLINICAL DATA:  Painless jaundice,  weight loss, pancreatic mass and hepatic metastasis on CT EXAM: MRI ABDOMEN WITHOUT AND WITH CONTRAST (INCLUDING MRCP) TECHNIQUE: Multiplanar multisequence MR imaging of the abdomen was performed both before and after the administration of intravenous contrast. Heavily T2-weighted images of the biliary and pancreatic ducts were obtained, and three-dimensional MRCP images were rendered by post processing. CONTRAST:  59mL GADAVIST GADOBUTROL 1 MMOL/ML IV SOLN COMPARISON:  CT 02/27/2020 FINDINGS: Lower chest:  Lung bases are clear. Hepatobiliary: Multifocal round enhancing lesions consistent with hepatic metastasis. Approximately 20 lesions in the liver ranging size from 1-3 cm. Example lesion in the RIGHT hepatic lobe measures 2.9 cm image 28/18. Example lesion LEFT hepatic lobe measures 2.1 cm on image 31/18. These lesions have a rim enhancing pattern typical metastasis. Gallbladder normal. No biliary duct dilatation. Common bile duct is normal. Pancreas: At the junction of the body and head of the pancreas, there is a hypoenhancing mass measuring 3.0 by 2.2 cm (image 68/18). This lesion has imaging characteristics typical of pancreatic adenocarcinoma. Lesion is free of the superior mesenteric artery. Lesion appears free of the celiac trunk and branches. Lesion appears free of the SMV and the portal vein. Several enlarged periaortic retroperitoneal lymph nodes. One lymph node LEFT of the aorta measures 14 mm on image 59/21 consistent with retroperitoneal nodal metastasis. Spleen: Normal spleen. Adrenals/urinary tract: Adrenal glands and kidneys are normal. Stomach/Bowel: Stomach and limited of the small bowel is unremarkable Vascular/Lymphatic: Abdominal aortic normal caliber. No retroperitoneal periportal lymphadenopathy. Musculoskeletal: No aggressive osseous lesion IMPRESSION: 1. 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 2. Pancreatic mass at the junction of the head and body appear.  3. Multifocal hepatic metastasis. 4. Retroperitoneal nodal metastasis. Electronically Signed   By: Suzy Bouchard M.D.   On: 02/28/2020 04:15   IR US Guide Bx Asp/Drain  Result Date: 03/01/2020 INDICATION: LIVER METASTASES, IMAGING FINDINGS COMPATIBLE WITH PANCREAS PRIMARY EXAM: ULTRASOUND RIGHT LIVER METASTASIS 18 GAUGE CORE BIOPSY MEDICATIONS: 1% lidocaine local ANESTHESIA/SEDATION: Moderate (conscious) sedation was employed during this procedure. A total of Versed 1.0 mg and Fentanyl 50 mcg was administered intravenously. Moderate Sedation Time: 12 minutes. The patient's level of consciousness and vital signs were monitored continuously by radiology nursing throughout the procedure under my direct supervision. FLUOROSCOPY TIME:  Fluoroscopy Time: None. COMPLICATIONS: None immediate. PROCEDURE: Informed written consent was obtained from the patient after a thorough discussion of the procedural risks, benefits and alternatives. All questions were addressed. Maximal Sterile Barrier Technique was utilized including caps, mask, sterile gowns, sterile gloves, sterile drape, hand hygiene and skin antiseptic. A timeout was performed prior to the initiation of the procedure. Previous imaging reviewed. Preliminary ultrasound performed. A solid hypoechoic lesion in the right liver was marked in a right lower intercostal space in the mid axillary line. Under sterile conditions and local anesthesia, a 17 gauge coaxial guide was advanced to the lesion. Needle position  confirmed with ultrasound. Images obtained for documentation. 3 18 gauge core biopsies obtained through the lesion under direct ultrasound. Samples were intact and non fragmented. These were placed in formalin. Needle tract occluded with Gel-Foam. Postprocedure imaging demonstrates no hemorrhage or hematoma. Patient tolerated biopsy well. IMPRESSION: Successful ultrasound right liver metastasis 18 gauge core biopsies Electronically Signed   By: Jerilynn Mages.  Shick  M.D.   On: 03/01/2020 11:28   MR ABDOMEN MRCP W WO CONTAST  Result Date: 02/28/2020 CLINICAL DATA:  Painless jaundice, weight loss, pancreatic mass and hepatic metastasis on CT EXAM: MRI ABDOMEN WITHOUT AND WITH CONTRAST (INCLUDING MRCP) TECHNIQUE: Multiplanar multisequence MR imaging of the abdomen was performed both before and after the administration of intravenous contrast. Heavily T2-weighted images of the biliary and pancreatic ducts were obtained, and three-dimensional MRCP images were rendered by post processing. CONTRAST:  43mL GADAVIST GADOBUTROL 1 MMOL/ML IV SOLN COMPARISON:  CT 02/27/2020 FINDINGS: Lower chest:  Lung bases are clear. Hepatobiliary: Multifocal round enhancing lesions consistent with hepatic metastasis. Approximately 20 lesions in the liver ranging size from 1-3 cm. Example lesion in the RIGHT hepatic lobe measures 2.9 cm image 28/18. Example lesion LEFT hepatic lobe measures 2.1 cm on image 31/18. These lesions have a rim enhancing pattern typical metastasis. Gallbladder normal. No biliary duct dilatation. Common bile duct is normal. Pancreas: At the junction of the body and head of the pancreas, there is a hypoenhancing mass measuring 3.0 by 2.2 cm (image 68/18). This lesion has imaging characteristics typical of pancreatic adenocarcinoma. Lesion is free of the superior mesenteric artery. Lesion appears free of the celiac trunk and branches. Lesion appears free of the SMV and the portal vein. Several enlarged periaortic retroperitoneal lymph nodes. One lymph node LEFT of the aorta measures 14 mm on image 59/21 consistent with retroperitoneal nodal metastasis. Spleen: Normal spleen. Adrenals/urinary tract: Adrenal glands and kidneys are normal. Stomach/Bowel: Stomach and limited of the small bowel is unremarkable Vascular/Lymphatic: Abdominal aortic normal caliber. No retroperitoneal periportal lymphadenopathy. Musculoskeletal: No aggressive osseous lesion IMPRESSION: 1. Imaging  findings most consistent with stage IV pancreatic adenocarcinoma. 2. Pancreatic mass at the junction of the head and body appear. 3. Multifocal hepatic metastasis. 4. Retroperitoneal nodal metastasis. Electronically Signed   By: Suzy Bouchard M.D.   On: 02/28/2020 04:15      Subjective:   Discharge Exam: Vitals:   03/01/20 1055 03/01/20 1100  BP: 132/77 135/84  Pulse: 90 89  Resp: 20 18  Temp:    SpO2: 100% 100%   Vitals:   03/01/20 1045 03/01/20 1050 03/01/20 1055 03/01/20 1100  BP: (!) 154/72 (!) 148/79 132/77 135/84  Pulse: 83 84 90 89  Resp: 12 15 20 18   Temp:      TempSrc:      SpO2: 100% 100% 100% 100%  Weight:      Height:        General: Pt is alert, awake, not in acute distress Cardiovascular: RRR, S1/S2 +, no rubs, no gallops Respiratory: CTA bilaterally, no wheezing, no rhonchi Abdominal: Soft, NT, ND, bowel sounds + Extremities: no edema, no cyanosis    The results of significant diagnostics from this hospitalization (including imaging, microbiology, ancillary and laboratory) are listed below for reference.     Microbiology: Recent Results (from the past 240 hour(s))  SARS Coronavirus 2 by RT PCR (hospital order, performed in [facility] hospital lab) Nasopharyngeal Nasopharyngeal Swab     Status: None   Collection Time: 02/28/20  3:40 AM  Specimen: Nasopharyngeal Swab  Result Value Ref Range Status   SARS Coronavirus 2 NEGATIVE NEGATIVE Final    Comment: (NOTE) SARS-CoV-2 target nucleic acids are NOT DETECTED.  The SARS-CoV-2 RNA is generally detectable in upper and lower respiratory specimens during the acute phase of infection. The lowest concentration of SARS-CoV-2 viral copies this assay can detect is 250 copies / mL. A negative result does not preclude SARS-CoV-2 infection and should not be used as the sole basis for treatment or other patient management decisions.  A negative result may occur with improper specimen collection / handling,  submission of specimen other than nasopharyngeal swab, presence of viral mutation(s) within the areas targeted by this assay, and inadequate number of viral copies (<250 copies / mL). A negative result must be combined with clinical observations, patient history, and epidemiological information.  Fact Sheet for Patients:   StrictlyIdeas.no  Fact Sheet for Healthcare Providers: BankingDealers.co.za  This test is not yet approved or  cleared by the [place] FDA and has been authorized for detection and/or diagnosis of SARS-CoV-2 by FDA under an Emergency Use Authorization (EUA).  This EUA will remain in effect (meaning this test can be used) for the duration of the COVID-19 declaration under Section 564(b)(1) of the Act, 21 U.S.C. section 360bbb-3(b)(1), unless the authorization is terminated or revoked sooner.  Performed at [facility], [place] [address] [place], [place] [address]      Labs: BNP (last 3 results) No results for input(s): BNP in the last 8760 hours. Basic Metabolic Panel: Recent Labs  Lab 02/27/20 1924 02/28/20 0411 02/29/20 1532  NA 133* 137 139  K 3.8 4.0 3.6  CL 94* 103 102  CO2 27 27 26   GLUCOSE 162* 100* 91  BUN 14 9 9   CREATININE 0.97 0.81 0.81  CALCIUM 9.4 9.0 9.8   Liver Function Tests: Recent Labs  Lab 02/27/20 1924 02/28/20 0411 02/29/20 1532  AST 26 21 22   ALT 16 17 19   ALKPHOS 94 85 102  BILITOT 0.3 0.4 0.7  PROT 6.8 6.1* 7.1  ALBUMIN 3.1* 2.9* 3.2*   Recent Labs  Lab 02/27/20 1924 02/29/20 1532  LIPASE 52* 35   No results for input(s): AMMONIA in the last 168 hours. CBC: Recent Labs  Lab 02/27/20 1924 02/28/20 0411 02/29/20 1532 03/01/20 0233  WBC 13.8* 12.4* 12.4* 12.7*  NEUTROABS  --   --  9.4*  --   HGB 10.4* 10.1* 11.3* 10.4*  HCT 28.5* 28.3* 31.2* 29.5*  MCV 80.1 81.1 80.2 80.4  PLT 477* 465* 503* 448*   Cardiac Enzymes: No results for input(s):  CKTOTAL, CKMB, CKMBINDEX, TROPONINI in the last 168 hours. BNP: Invalid input(s): POCBNP CBG: No results for input(s): GLUCAP in the last 168 hours. D-Dimer No results for input(s): DDIMER in the last 72 hours. Hgb A1c No results for input(s): HGBA1C in the last 72 hours. Lipid Profile No results for input(s): CHOL, HDL, LDLCALC, TRIG, CHOLHDL, LDLDIRECT in the last 72 hours. Thyroid function studies No results for input(s): TSH, T4TOTAL, T3FREE, THYROIDAB in the last 72 hours.  Invalid input(s): FREET3 Anemia work up No results for input(s): VITAMINB12, FOLATE, FERRITIN, TIBC, IRON, RETICCTPCT in the last 72 hours. Urinalysis    Component Value Date/Time   COLORURINE YELLOW 02/27/2020 1914   APPEARANCEUR CLEAR 02/27/2020 1914   LABSPEC 1.013 02/27/2020 1914   PHURINE 7.0 02/27/2020 1914   GLUCOSEU NEGATIVE 02/27/2020 1914   HGBUR NEGATIVE 02/27/2020 1914   BILIRUBINUR NEGATIVE  02/27/2020 [place] 02/27/2020 1914   PROTEINUR NEGATIVE 02/27/2020 1914   NITRITE NEGATIVE 02/27/2020 1914   LEUKOCYTESUR NEGATIVE 02/27/2020 1914   Sepsis Labs Invalid input(s): PROCALCITONIN,  WBC,  LACTICIDVEN Microbiology Recent Results (from the past 240 hour(s))  SARS Coronavirus 2 by RT PCR (hospital order, performed in [facility] hospital lab) Nasopharyngeal Nasopharyngeal Swab     Status: None   Collection Time: 02/28/20  3:40 AM   Specimen: Nasopharyngeal Swab  Result Value Ref Range Status   SARS Coronavirus 2 NEGATIVE NEGATIVE Final    Comment: (NOTE) SARS-CoV-2 target nucleic acids are NOT DETECTED.  The SARS-CoV-2 RNA is generally detectable in upper and lower respiratory specimens during the acute phase of infection. The lowest concentration of SARS-CoV-2 viral copies this assay can detect is 250 copies / mL. A negative result does not preclude SARS-CoV-2 infection and should not be used as the sole basis for treatment or other patient management decisions.  A  negative result may occur with improper specimen collection / handling, submission of specimen other than nasopharyngeal swab, presence of viral mutation(s) within the areas targeted by this assay, and inadequate number of viral copies (<250 copies / mL). A negative result must be combined with clinical observations, patient history, and epidemiological information.  Fact Sheet for Patients:   StrictlyIdeas.no  Fact Sheet for Healthcare Providers: BankingDealers.co.za  This test is not yet approved or  cleared by the [place] FDA and has been authorized for detection and/or diagnosis of SARS-CoV-2 by FDA under an Emergency Use Authorization (EUA).  This EUA will remain in effect (meaning this test can be used) for the duration of the COVID-19 declaration under Section 564(b)(1) of the Act, 21 U.S.C. section 360bbb-3(b)(1), unless the authorization is terminated or revoked sooner.  Performed at [facility], [place] [address] [place], [place] [address]      Time coordinating discharge: Over 30 minutes  SIGNED:   Donnamae Jude, MD  [facility] 03/01/2020, 2:30 PM  If 7PM-7AM, please contact night-coverage

## 2020-03-01 NOTE — Discharge Instructions (Signed)
Pancreatic Cancer  The pancreas is a gland in the abdomen between the stomach and the spine. The pancreas makes hormones that control blood sugar and helps the body use and store energy that comes from food. The pancreas also makes enzymes that help digest food. Pancreatic cancer is when there is a tumor in the pancreas that is cancerous (malignant). There are two types of pancreatic cancer:  Exocrine. This is the most common type.  Endocrine. This is also called islet cell cancer. Pancreatic cancer can spread (metastasize) to other parts of the body. What are the causes? The exact cause of this condition is not known. What increases the risk? The following factors may make you more likely to develop this condition:  Being over 65 years old.  Smoking cigarettes.  Having a family history of cancer of the pancreas, colon, or ovaries.  Having diabetes.  Having long-term inflammation of the pancreas (chronic pancreatitis).  Being exposed to certain chemicals.  Being obese and having a decreased level of physical activity.  Eating a diet that is high in fat and red meat.  Having certain hereditary conditions. What are the signs or symptoms? In the early stages, there are often no symptoms of this condition. As the cancer gets worse, symptoms may vary depending on the type of pancreatic cancer you have. Common symptoms include:  Nausea and vomiting.  Loss of appetite and unintended weight loss.  Pain in the upper abdomen or upper back.  Skin or the white parts of the eyes turning yellow (jaundice).  Fatigue. Other symptoms include:  Itchy skin.  Dark urine.  Stools that are light-colored and greasy-looking, or stools that are black and tarry-looking.  A lump under the rib cage on the right side.  High blood sugar (hyperglycemia). This may cause increased thirst and frequent urination.  Low blood sugar (hypoglycemia). This may cause confusion, sweating, and a fast  heartbeat.  Depression. How is this diagnosed? This condition may be diagnosed based on your medical history and a physical exam. Your health care provider may check your:  Skin and eyes for signs of jaundice.  Abdomen for any changes in the areas near the pancreas. Your health care provider may also check for excess fluid in the abdomen. You may also have other tests, including:  Blood and urine tests.  Genetic testing.  Ultrasound.  CT scan.  MRI.  A biopsy. A sample of pancreatic tissue would be removed and looked at under a microscope. If pancreatic cancer is diagnosed, it will be staged to determine its severity and extent. Staging is an assessment of:  The size of the tumor.  If the cancer has spread.  Where the cancer has spread. How is this treated? Depending on the type and stage of your pancreatic cancer, treatment may include:  Surgery to remove all or part of the pancreas, or to remove the tumor.  Chemotherapy. This uses medicine to destroy the cancer cells.  Radiation therapy. This uses high-energy beams to kill cancer cells.  Medicine to attack a tumor's genes and proteins (targeted therapy). These medicines attack the genes and proteins that allow a tumor to grow while limiting damage to healthy cells.  Participating in clinical trials to see if new (experimental) treatments are effective.  Medicines to help manage pain and other symptoms. Your health care provider may recommend a combination of surgery, radiation therapy, and chemotherapy. You may be referred to a health care provider who specializes in cancer (oncologist). Follow these instructions   at home: Medicines  Take over-the-counter and prescription medicines only as told by your health care provider.  Ask your health care provider about changing or stopping your regular medicines. This is especially important if you are taking diabetes medicines or blood thinners.  Do not take dietary  supplements or herbal medicines unless your health care provider tells you to take them. Some supplements can interfere with how well the treatment works.  Ask your health care provider if the medicine prescribed to you: ? Requires you to avoid driving or using heavy machinery. ? Can cause constipation. You may need to take these actions to prevent or treat constipation:  Drink enough fluid to keep your urine pale yellow.  Take over-the-counter or prescription medicines.  Eat foods that are high in fiber, such as beans, whole grains, and fresh fruits and vegetables.  Limit foods that are high in fat and processed sugars, such as fried or sweet foods. Lifestyle  Get enough sleep on a regular basis. Most adults need 6-8 hours of sleep each night. During treatment, you may need more sleep.  Rest as told by your health care provider.  Consider joining a cancer support group. Ask your health care provider for more information about local and online support groups. This may help you learn to cope with the stress of having pancreatic cancer.  Do not use any products that contain nicotine or tobacco, such as cigarettes, e-cigarettes, and chewing tobacco. If you need help quitting, ask your health care provider. Eating and drinking  Try to eat regular, healthy meals. Some of your treatments might affect your appetite. If you are having problems eating or with your appetite, ask to meet with a food and nutrition specialist (dietitian).  Do not drink alcohol. General instructions  Return to your normal activities as told by your health care provider. Ask your health care provider what activities are safe for you.  Work with your health care provider to manage any side effects of your treatment.  Keep all follow-up visits as told by your health care provider. This is important. Where to find more information  American Cancer Society: https://www.cancer.org  National Cancer Institute (NCI):  https://www.cancer.gov Contact a health care provider if:  You have unexplained weight loss. Get help right away if:  Your pain suddenly gets worse.  Your skin or eyes turn more yellow.  You cannot eat or drink without vomiting.  You have: ? Trouble breathing. ? Chest pain or an irregular heartbeat. ? Blood in your vomit or dark, tarry stools. ? New fatigue or weakness. ? Abdominal bloating or pain. These symptoms may represent a serious problem that is an emergency. Do not wait to see if the symptoms will go away. Get medical help right away. Call your local emergency services (911 in the [place] Do not drive yourself to the hospital. Summary  Pancreatic cancer is a tumor in the pancreas that is cancerous (malignant).  Risk factors include having a family history of cancer of the pancreas, colon, or ovaries. Risk factors also include having long-term inflammation of the pancreas (chronic pancreatitis) and diabetes.  Treatment may include a combination of surgery, medicine to destroy cancer cells (chemotherapy), and high-energy beams to kill cancer cells (radiation therapy).  Consider joining a cancer support group. This may help you learn to cope with the stress of having pancreatic cancer.  Keep all follow-up visits as told by your health care provider. This is important. This information is not intended to replace advice   given to you by your health care provider. Make sure you discuss any questions you have with your health care provider. Document Revised: 11/27/2018 Document Reviewed: 12/04/2018 Elsevier Patient Education  2020 [employer]  

## 2020-03-01 NOTE — Procedures (Signed)
Interventional Radiology Procedure Note  Procedure: [place] RIGHT LIVER LESION BX  Complications: None  Estimated Blood Loss: MIN  Findings: 18 G CORES X 3

## 2020-03-02 ENCOUNTER — Telehealth: Payer: Self-pay | Admitting: Oncology

## 2020-03-02 NOTE — Telephone Encounter (Signed)
Received a msg from Dr. Jana Hakim to schedule a new pt appt for Joe [name] to see GI oncology for stage IV pancreatic cancer. Joe [name] has been cld and scheduled to see Dr. [name] on 6/30 at [facility] date and time has been given to the pt's wife. Aware to arrive 15 minutes early.

## 2020-03-03 LAB — SURGICAL PATHOLOGY
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
## 2020-03-07 ENCOUNTER — Ambulatory Visit (INDEPENDENT_AMBULATORY_CARE_PROVIDER_SITE_OTHER): Payer: BC Managed Care – PPO | Admitting: Nurse Practitioner

## 2020-03-07 ENCOUNTER — Emergency Department (HOSPITAL_COMMUNITY)
Admission: EM | Admit: 2020-03-07 | Discharge: 2020-03-07 | Disposition: A | Discharge status: Home / Self Care | Payer: BC Managed Care – PPO | Attending: Emergency Medicine | Admitting: Emergency Medicine

## 2020-03-07 ENCOUNTER — Encounter (HOSPITAL_COMMUNITY): Payer: Self-pay

## 2020-03-07 ENCOUNTER — Encounter: Payer: Self-pay | Admitting: Nurse Practitioner

## 2020-03-07 ENCOUNTER — Other Ambulatory Visit: Payer: Self-pay

## 2020-03-07 VITALS — BP 144/70 | HR 116

## 2020-03-07 DIAGNOSIS — C799 Secondary malignant neoplasm of unspecified site: Secondary | ICD-10-CM | POA: Diagnosis not present

## 2020-03-07 DIAGNOSIS — R112 Nausea with vomiting, unspecified: Secondary | ICD-10-CM

## 2020-03-07 DIAGNOSIS — C259 Malignant neoplasm of pancreas, unspecified: Secondary | ICD-10-CM | POA: Diagnosis not present

## 2020-03-07 DIAGNOSIS — R109 Unspecified abdominal pain: Secondary | ICD-10-CM | POA: Diagnosis present

## 2020-03-07 DIAGNOSIS — E860 Dehydration: Secondary | ICD-10-CM | POA: Diagnosis not present

## 2020-03-07 DIAGNOSIS — Z87891 Personal history of nicotine dependence: Secondary | ICD-10-CM | POA: Insufficient documentation

## 2020-03-07 DIAGNOSIS — C250 Malignant neoplasm of head of pancreas: Secondary | ICD-10-CM | POA: Diagnosis not present

## 2020-03-07 DIAGNOSIS — R1084 Generalized abdominal pain: Secondary | ICD-10-CM | POA: Diagnosis not present

## 2020-03-07 DIAGNOSIS — R627 Adult failure to thrive: Secondary | ICD-10-CM | POA: Insufficient documentation

## 2020-03-07 LAB — COMPREHENSIVE METABOLIC PANEL
ALT: 19 U/L (ref 0–44)
AST: 27 U/L (ref 15–41)
Albumin: 2.7 g/dL — ABNORMAL LOW (ref 3.5–5.0)
Alkaline Phosphatase: 126 U/L (ref 38–126)
Anion gap: 9 (ref 5–15)
BUN: 10 mg/dL (ref 6–20)
CO2: 26 mmol/L (ref 22–32)
Calcium: 8.5 mg/dL — ABNORMAL LOW (ref 8.9–10.3)
Chloride: 105 mmol/L (ref 98–111)
Creatinine, Ser: 0.59 mg/dL — ABNORMAL LOW (ref 0.61–1.24)
GFR calc Af Amer: >60 mL/min (ref >60)
GFR calc non Af Amer: >60 mL/min (ref >60)
Glucose, Bld: 85 mg/dL (ref 70–99)
Potassium: 4.2 mmol/L (ref 3.5–5.1)
Sodium: 140 mmol/L (ref 135–145)
Total Bilirubin: 0.8 mg/dL (ref 0.3–1.2)
Total Protein: 6.3 g/dL — ABNORMAL LOW (ref 6.5–8.1)

## 2020-03-07 LAB — URINALYSIS, ROUTINE W REFLEX MICROSCOPIC
Bilirubin Urine: NEGATIVE
Glucose, UA: NEGATIVE mg/dL
Hgb urine dipstick: NEGATIVE
Ketones, ur: 5 mg/dL — AB
Leukocytes,Ua: NEGATIVE
Nitrite: NEGATIVE
Protein, ur: NEGATIVE mg/dL
Specific Gravity, Urine: 1.015 (ref 1.005–1.030)
pH: 7 (ref 5.0–8.0)

## 2020-03-07 LAB — CBC WITH DIFFERENTIAL/PLATELET
Abs Immature Granulocytes: 0.07 10*3/uL (ref 0.00–0.07)
Basophils Absolute: 0 10*3/uL (ref 0.0–0.1)
Basophils Relative: 0 %
Eosinophils Absolute: 0 10*3/uL (ref 0.0–0.5)
Eosinophils Relative: 0 %
HCT: 35 % — ABNORMAL LOW (ref 39.0–52.0)
Hemoglobin: 12.7 g/dL — ABNORMAL LOW (ref 13.0–17.0)
Immature Granulocytes: 1 %
Lymphocytes Relative: 8 %
Lymphs Abs: 1.1 10*3/uL (ref 0.7–4.0)
MCH: 29.1 pg (ref 26.0–34.0)
MCHC: 36.3 g/dL — ABNORMAL HIGH (ref 30.0–36.0)
MCV: 80.3 fL (ref 80.0–100.0)
Monocytes Absolute: 1.3 10*3/uL — ABNORMAL HIGH (ref 0.1–1.0)
Monocytes Relative: 9 %
Neutro Abs: 11.3 10*3/uL — ABNORMAL HIGH (ref 1.7–7.7)
Neutrophils Relative %: 82 %
Platelets: 610 10*3/uL — ABNORMAL HIGH (ref 150–400)
RBC: 4.36 MIL/uL (ref 4.22–5.81)
RDW: 12.2 % (ref 11.5–15.5)
WBC: 13.8 10*3/uL — ABNORMAL HIGH (ref 4.0–10.5)
nRBC: 0 % (ref 0.0–0.2)

## 2020-03-07 LAB — PROTIME-INR
INR: 1.1 (ref 0.8–1.2)
Prothrombin Time: 13.3 seconds (ref 11.4–15.2)

## 2020-03-07 LAB — LIPASE, BLOOD: Lipase: 24 U/L (ref 11–51)

## 2020-03-07 MED ORDER — ONDANSETRON HCL 4 MG/2ML IJ SOLN
4.0000 mg | Freq: Once | INTRAMUSCULAR | Status: AC
  Administered 2020-03-07: 4 mg via INTRAVENOUS
  Filled 2020-03-07: qty 2

## 2020-03-07 MED ORDER — ONDANSETRON 8 MG PO TBDP
8.0000 mg | ORAL_TABLET | Freq: Three times a day (TID) | ORAL | 0 refills | Status: DC | PRN
Start: 2020-03-07 — End: 2020-04-28

## 2020-03-07 MED ORDER — SODIUM CHLORIDE 0.9 % IV BOLUS
1000.0000 mL | Freq: Once | INTRAVENOUS | Status: AC
  Administered 2020-03-07: 1000 mL via INTRAVENOUS

## 2020-03-07 MED ORDER — SODIUM CHLORIDE 0.9 % IV SOLN: Freq: Once | INTRAVENOUS | Status: AC

## 2020-03-07 MED ORDER — HYDROMORPHONE HCL 2 MG PO TABS
2.0000 mg | ORAL_TABLET | Freq: Once | ORAL | Status: AC
  Administered 2020-03-07: 2 mg via ORAL
  Filled 2020-03-07: qty 1

## 2020-03-07 MED ORDER — HYDROMORPHONE HCL 1 MG/ML IJ SOLN
1.0000 mg | Freq: Once | INTRAMUSCULAR | Status: AC
  Administered 2020-03-07: 1 mg via INTRAVENOUS
  Filled 2020-03-07: qty 1

## 2020-03-07 MED ORDER — HYDROMORPHONE HCL 2 MG PO TABS: 2.0000 mg | ORAL_TABLET | ORAL | 0 refills | Status: DC | PRN
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
## 2020-03-07 NOTE — ED Provider Notes (Signed)
Care assumed from [facility], please see her note for full details, but in brief Joe [name] is a 59 y.o. male who was recently diagnosed with stage IV pancreatic cancer, was discharged from the hospital 6 days ago with palliative care consult.  Return to his GI doctor today due to worsening pain not managed with hydrocodone and antiemetics that he has had at home.  Pain got to the point where he could not eat or drink.  Persistent nausea without emesis.  Reports having difficulty finding a balance with bowel regimen so that he can pass stools regularly as well but is passing gas without difficulty.  GI recommended that he come to the ED for further evaluation and recommended admission for pain control, but patient is hesitant to be readmitted to the hospital.  He reports they are waiting to hear from hospice/palliative care, they have not had their home intake done yet.  Pain is unchanged, no associated fevers.  Lab work obtained, but imaging not felt necessary, patient had very recent abdominal imaging.  IV fluids and IV pain management given.  At shift change lab work is pending, and patient is discussing with his wife whether or not he wants to be admitted to the hospital.  BP (!) 169/100   Pulse 90   Temp 98.6 F (37 C) (Oral)   Resp 18   Ht 6\' 1"  (1.854 m)   Wt 61.2 kg   SpO2 96%   BMI 17.81 kg/m    ED Course/Procedures   Labs Reviewed  CBC WITH DIFFERENTIAL/PLATELET - Abnormal; Notable for the following components:      Result Value   WBC 13.8 (*)    Hemoglobin 12.7 (*)    HCT 35.0 (*)    MCHC 36.3 (*)    Platelets 610 (*)    Neutro Abs 11.3 (*)    Monocytes Absolute 1.3 (*)    All other components within normal limits  URINALYSIS, ROUTINE W REFLEX MICROSCOPIC - Abnormal; Notable for the following components:   APPearance CLOUDY (*)    Ketones, ur 5 (*)    All other components within normal limits  COMPREHENSIVE METABOLIC PANEL - Abnormal; Notable for the  following components:   Creatinine, Ser 0.59 (*)    Calcium 8.5 (*)    Total Protein 6.3 (*)    Albumin 2.7 (*)    All other components within normal limits  PROTIME-INR  LIPASE, BLOOD    Procedures  MDM    Patient's lab work has returned and it all appears to be at baseline.  His pain is improved with treatment here in the ED.  At home he has been taking hydrocodone, and he states this medication has not been managing his pain, it gets out of control and then he gets to the point where he cannot eat.  His primary goal is to be able to go home and remain comfortable enough that he can eat and drink. He does not want to be admitted to the hospital today.  Patient had hospice consult placed during his recent hospitalization but has not yet had hospice intake evaluation at home.  I called and spoke with hospice [facility] and they will send out their hospital liaison [name] who will do evaluation here so that they can get hospice home health services set up for the patient to help better manage his pain.  Spoke with [name] with hospice/palliative care, states previously patient was put in as a palliative  consult which is a much less robust program.  Patient has not received results of his biopsy yet and so has not had aggressive goals of care conversations with oncology yet, but patient states his primary goal is that he wants to be at home and he wants to be comfortable and able to eat the food he would like to eat.  He seems well aware of the severity of his metastatic pancreatic cancer and knows that he will very likely die of this.  He seems ready for discussions with hospice care.  Patient had palliative care consult during previous hospital admission but note is not complete and I am unable to see the recommendations.  Home Hospice can get out to see him tonight or tomorrow morning at home, but patient will need pain management to go home with.  Will discuss with palliative care for  better pain management regimen.  Did not receive callback from palliative care team but discussed with on-call oncologist Dr. Marin Olp who recommended starting patient on 2 mg of Dilaudid every 4 hours as well as Zofran 8 mg every 8 hours for better management of his pain at home.  The hospice/palliative team will be out for in-home intake and evaluation tomorrow and has already reached out to the patient by phone and patient has an appointment with Dr. [name] with oncology in 2 days.  Patient's pain has been well managed here in the ED and he has been able to tolerate p.o. and is very comfortable with this plan.  I have called patient's pharmacy and ensure that they do have these pain medications in stock and available for pickup tonight.  Patient will be discharged home, discussed with him that if pain is still not improving he should return to the emergency department for additional pain management, but at this time he feels comfortable going home.  Patient and his wife expressed understanding and agreement with plan.  Discharged home in good condition.  Final diagnoses:  Metastatic malignant neoplasm, unspecified site ([place])  Nausea and vomiting, intractability of vomiting not specified, unspecified vomiting type  Generalized abdominal pain    ED Discharge Orders         Ordered    HYDROmorphone (DILAUDID) 2 MG tablet  Every 4 hours PRN     Discontinue  Reprint     03/07/20 1741    ondansetron (ZOFRAN ODT) 8 MG disintegrating tablet  Every 8 hours PRN     Discontinue  Reprint     03/07/20 1741             Jacqlyn Larsen, PA-C 03/07/20 1758    Drenda Freeze, MD 03/07/20 2244

## 2020-03-07 NOTE — Patient Instructions (Signed)
Please present to the [facility] Emergency room for evaluation and management.

## 2020-03-07 NOTE — Progress Notes (Addendum)
[facility])  Mr. Lanzo was referred to our outpatient palliative care program when he was discharged last week. However, he has not been enrolled in our program yet and was scheduled to have his first visit with one of our providers this week.  He returned to his GI MD today following a pain crisis > ED.  Per ED provider, pt wants to go home and focus on comfort.   Pt will need adequate symptom management prior to be able to discharge.   Spoke with pt and wife, he is adamant that he wants to d/c home. He does not need any DME to go home, only needs pain management to get through the night.   Hospice will contact him in the am to schedule appointment time to visit tomorrow.  Venia Carbon RN, BSN, [facility] Liaison

## 2020-03-07 NOTE — Discharge Instructions (Addendum)
Take 2 mg tablets of Dilaudid once every 4 hours to help better manage your pain.  To help manage nausea take Zofran, one 8 mg tablet scheduled every 8 hours.  You may use Phenergan that you have at home as needed for breakthrough nausea.  These medication should get you through until you are seen by oncology on Wednesday, they can adjust your pain regimen as needed.  You should also receive a visit from the hospice/palliative care team tomorrow at your house.

## 2020-03-07 NOTE — ED Triage Notes (Signed)
EMS reports from [facility], called out for abdominal pain, Pt hx of pancreatic CA. Appt with oncologist Wednesday, and states out of medication since yesterday. Pt caregiver states has not been able to eat or drink sufficiently in X 1 week.  BP 164/100 HR 90 SP02 96 RA RR 20

## 2020-03-08 ENCOUNTER — Telehealth: Payer: Self-pay | Admitting: Nurse Practitioner

## 2020-03-08 NOTE — Telephone Encounter (Signed)
Beth, pls let Crystal from hospice that Joe [name] is scheduled to see his oncologist Dr. [name] Wed. 6/30. Dr. [name] should be the physician managing the patient's care. Thank you

## 2020-03-08 NOTE — Progress Notes (Signed)
Attempted to make introductory call with patient.  No answer had to leave voice message.  Reminded him of his appointment tomorrow at 9:00 am with Dr. [name] to arrive by 8:45 at the latest for registration.  I left my direct phone number for him to call back if he has any questions.

## 2020-03-09 ENCOUNTER — Telehealth: Payer: Self-pay | Admitting: Oncology

## 2020-03-09 ENCOUNTER — Inpatient Hospital Stay: Payer: BC Managed Care – PPO | Attending: Oncology | Admitting: Oncology

## 2020-03-09 ENCOUNTER — Telehealth: Payer: Self-pay | Admitting: *Deleted

## 2020-03-09 ENCOUNTER — Other Ambulatory Visit: Payer: Self-pay

## 2020-03-09 VITALS — BP 125/83 | HR 119 | Temp 100.2°F | Resp 20 | Ht 73.0 in | Wt 139.8 lb

## 2020-03-09 DIAGNOSIS — Z7189 Other specified counseling: Secondary | ICD-10-CM | POA: Diagnosis not present

## 2020-03-09 DIAGNOSIS — C250 Malignant neoplasm of head of pancreas: Secondary | ICD-10-CM

## 2020-03-09 MED ORDER — OXYCODONE HCL 5 MG PO TABS
ORAL_TABLET | ORAL | Status: AC
  Filled 2020-03-09: qty 2

## 2020-03-09 MED ORDER — OXYCODONE HCL 5 MG PO TABS
10.0000 mg | ORAL_TABLET | Freq: Once | ORAL | Status: AC
  Administered 2020-03-09: 10 mg via ORAL

## 2020-03-09 MED ORDER — MORPHINE SULFATE ER 15 MG PO TBCR: 15.0000 mg | EXTENDED_RELEASE_TABLET | Freq: Two times a day (BID) | ORAL | 0 refills | Status: DC

## 2020-03-09 MED ORDER — SORBITOL 70 % PO SOLN
30.0000 mL | Freq: Two times a day (BID) | ORAL | 0 refills | Status: DC
Start: 2020-03-09 — End: 2020-04-07

## 2020-03-09 MED ORDER — OXYCODONE HCL ER 20 MG PO T12A: 20.0000 mg | EXTENDED_RELEASE_TABLET | Freq: Two times a day (BID) | ORAL | 0 refills | Status: DC

## 2020-03-09 MED ORDER — OXYCODONE HCL 5 MG PO TABS: 5.0000 mg | ORAL_TABLET | ORAL | 0 refills | Status: DC | PRN

## 2020-03-09 NOTE — Progress Notes (Signed)
START OFF PATHWAY REGIMEN - Pancreatic Adenocarcinoma   [id number]:Gemcitabine 1,000 mg/m2 IV D1,8,15 + Nab-Paclitaxel 125 mg/m2 IV D1,8,15 q28 Days:   A cycle is every 28 days:     Nab-paclitaxel (protein bound)      Gemcitabine   **Always confirm dose/schedule in your pharmacy ordering system**  Patient Characteristics: Metastatic Disease, First Line, PS = 0,1, BRCA1/2 and PALB2  Mutation Absent/Unknown Therapeutic Status: Metastatic Disease Line of Therapy: First Line ECOG Performance Status: 1 BRCA1/2 Mutation Status: Awaiting Test Results PALB2 Mutation Status: Awaiting Test Results Intent of Therapy: Non-Curative / Palliative Intent, Discussed with Patient

## 2020-03-09 NOTE — Progress Notes (Signed)
Met with patient and his wife Joe [name] today at his initial medical oncology appointment with Dr. [name].  I explained my role as nurse navigator and they were given my card with my direct contact information.  He was shown a model of a port a cath.  I am also making a referral to CSW because his distress level is 8 today.  Also he continues to loose weight so I have made a referral to the nutritionist.  He feels like his pain is keeping him from eating - Dr. [name] is addressing this issue today.  In in interim I have asked him to supplement his diet with at least 2 to 3 Ensures daily.  He verbalized an understanding of the plan ahead and understands he will receive a phone call from IR regarding placing his port a cath.
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
## 2020-03-09 NOTE — Telephone Encounter (Signed)
Scheduled per los. Gave avs and calendar  

## 2020-03-09 NOTE — Progress Notes (Signed)
Addendum: Reviewed and agree with assessment and management plan. Jayde Mcallister M, MD  

## 2020-03-09 NOTE — Telephone Encounter (Signed)
Done

## 2020-03-09 NOTE — Telephone Encounter (Addendum)
Notified by [facility] that his insurance does not cover Oxycontin and no PA is an option. MS Contin and Fentanyl are covered. Also to dispense the quantity of oxyir  MD has ordered will require a PA by calling [phone]. Spoke w/patient and he agrees to try the MS Contin. Informed him that most likely the reason his father did not eat was due to his cancer and not the morphine.

## 2020-03-09 NOTE — Patient Instructions (Addendum)
For Constipation: Sorbitol 30 cc twice daily till bowel movement and then take daily, and start Senna-S #1 capsules twice daily.   Constipation, Adult Constipation is when a person:  Poops (has a bowel movement) fewer times in a week than normal.  Has a hard time pooping.  Has poop that is dry, hard, or bigger than normal. Follow these instructions at home: Eating and drinking   Eat foods that have a lot of fiber, such as: ? Fresh fruits and vegetables. ? Whole grains. ? Beans.  Eat less of foods that are high in fat, low in fiber, or overly processed, such as: ? [place] fries. ? Hamburgers. ? Cookies. ? Candy. ? Soda.  Drink enough fluid to keep your pee (urine) clear or pale yellow. General instructions  Exercise regularly or as told by your doctor.  Go to the restroom when you feel like you need to poop. Do not hold it in.  Take over-the-counter and prescription medicines only as told by your doctor. These include any fiber supplements.  Do pelvic floor retraining exercises, such as: ? Doing deep breathing while relaxing your lower belly (abdomen). ? Relaxing your pelvic floor while pooping.  Watch your condition for any changes.  Keep all follow-up visits as told by your doctor. This is important. Contact a doctor if:  You have pain that gets worse.  You have a fever.  You have not pooped for 4 days.  You throw up (vomit).  You are not hungry.  You lose weight.  You are bleeding from the anus.  You have thin, pencil-like poop (stool). Get help right away if:  You have a fever, and your symptoms suddenly get worse.  You leak poop or have blood in your poop.  Your belly feels hard or bigger than normal (is bloated).  You have very bad belly pain.  You feel dizzy or you faint. This information is not intended to replace advice given to you by your health care provider. Make sure you discuss any questions you have with your health care  provider. Document Revised: 08/09/2017 Document Reviewed: 02/15/2016 Elsevier Patient Education  2020 [employer].

## 2020-03-10 ENCOUNTER — Telehealth: Payer: Self-pay | Admitting: General Practice

## 2020-03-10 ENCOUNTER — Other Ambulatory Visit: Payer: Self-pay | Admitting: Oncology

## 2020-03-10 ENCOUNTER — Telehealth: Payer: Self-pay | Admitting: *Deleted

## 2020-03-10 MED ORDER — OXYCODONE HCL 5 MG PO TABS: 5.0000 mg | ORAL_TABLET | ORAL | 0 refills | Status: DC | PRN

## 2020-03-10 NOTE — Telephone Encounter (Signed)
[facility] CSW Progress Notes  Call to patient to address Distress Screen referral.  He is most concerned about getting his pain medications refilled - states that he needs the oxycodone refilled as it helps relieve his severe pain.  Communicated w Annette Stable RN - she is working to get prescription sent to his pharmacy and will call patient's wife to provide guidance.  Patient was also advised to call his pharmacy to see the status of his refill request.  CSW will call him tomorrow at 10 AM to complete Distress Screen.  Edwyna Shell, LCSW Clinical Social Worker Phone:  [phone] Cell:  [phone]

## 2020-03-10 NOTE — Telephone Encounter (Signed)
Received vm call from [facility] stating that pt needs refill on pain med.  Called & spoke with Verdis Frederickson & she states that she paid for # 10 oxycodone & has  Left.  [facility] & verified that morphine script is ready & PA went through on oxycodone but they paid for # 10 therefore they need new script.  Informed Dr [name] for new script & explained to [facility] to check with pharmacy later to hopefully get scripts.

## 2020-03-11 ENCOUNTER — Inpatient Hospital Stay: Payer: BC Managed Care – PPO | Attending: Oncology | Admitting: General Practice

## 2020-03-11 DIAGNOSIS — Z808 Family history of malignant neoplasm of other organs or systems: Secondary | ICD-10-CM | POA: Insufficient documentation

## 2020-03-11 DIAGNOSIS — K5900 Constipation, unspecified: Secondary | ICD-10-CM | POA: Insufficient documentation

## 2020-03-11 DIAGNOSIS — C250 Malignant neoplasm of head of pancreas: Secondary | ICD-10-CM | POA: Insufficient documentation

## 2020-03-11 DIAGNOSIS — Z5111 Encounter for antineoplastic chemotherapy: Secondary | ICD-10-CM | POA: Insufficient documentation

## 2020-03-11 DIAGNOSIS — G893 Neoplasm related pain (acute) (chronic): Secondary | ICD-10-CM | POA: Insufficient documentation

## 2020-03-11 NOTE — Progress Notes (Signed)
[facility] Psychosocial Distress Screening Clinical Social Work  Clinical Social Work was referred by distress screening protocol.  The patient scored a 8 on the Psychosocial Distress Thermometer which indicates moderate distress. Clinical Social Worker contacted patient by phone to assess for distress and other psychosocial needs. Spoke w patient and wife on phone.    ONCBCN DISTRESS SCREENING 03/09/2020  Screening Type Initial Screening  Distress experienced in past week (1-10) 8  Emotional problem type Boredom  Physical Problem type Pain;Nausea/vomiting;Constipation/diarrhea  Physician notified of physical symptoms Yes  Referral to clinical psychology No  Referral to clinical social work Yes  Referral to dietition Yes  Referral to financial advocate No  Referral to support programs No  Referral to palliative care No     Barriers to care/review of distress screen:  - Transportation:  Do you anticipate any problems getting to appointments?  Do you have someone who can help run errands for you if you need it?  Wants to drive himself, daughter.  May also need help w [facility] Transportation if no one is available to assist - Help at home:  What is your living situation (alone, family, other)?  If you are physically unable to care for yourself, who would you call on to help you?  Lives w wife, "she is an Building services engineer", "she is already doing everything for me."   - Support system:  What does your support system look like?  Who would you call on if you needed some kind of practical help?  What if you needed someone to talk to for emotional support?  Wife, daughter/daughter in Sports coach, neighbors, step children - Finances:  Are you concerned about finances.  Considering returning to work?  If not, applying for disability?  Works for [employer], is trying to get his disability "all synchronized w everyone it needs to go through."  Wife has gone online to apply w [place] disability already - has workplace disability  through [place].   Thinks he has both short and long term disability through his workplace.  "Tremendous pressure on me because I don't have it solidified w everyone."  Was encouraged to start w his employer's human resources department for help accessing any employer benefits.  His wife and neighbor have contacted [place] on his behalf and started an application for government disability benefits.    What is your understanding of where you are with your cancer? Its cause?  Your treatment plan and what happens next?  59 year old male, newly diagnosed w Initial issue started when he had surgery to drain a cyst on his scalp - experienced a "bowel shut down", noticed a change in his bowel habits post surgery.  No issues throughout his life - now experienced a difficulty in having bowel movement.  Did scans, visualized blockage and saw spots of liver and pancreas.  Initially thought it was a bowel obstruction, now diagnosed w Stage IV pancreatic cancer.  Plan thus far is for "not super aggressive chemo that burns up your veins", will have port placed and will have chemotherapy.  Also plans to do as much as he can to support his cancer treatment through natural means, including nutrition and exercise.    What are your worries for the future as you begin treatment for cancer?  "I dont really have any preconceived worries, the first day will be the worst day."  Is trying to stay positive.  What are your hopes and priorities during your treatment? What is important  to you? What are your goals for your care?  Wants to maintain baseline of strength and wellness throughout treatment.  Also values good, coordinated communication between providers, wants everyone on the same page re his treatment and needs.    CSW Summary:  Patient and family psychosocial functioning including strengths, limitations, and coping skills:  59 year old married male, newly diagnosed w Stage IV Pancreatic Cancer,  about to start infusion chemotherapy.  Works at [employer], currently unable to work since June 19.  Worried about the financial impact of being out of work.  Good support from wife, children, stepchildren and neighbors.  Wants to be sure he has adequate pain relief so he can maintain as much wellness and normality in life as possible.  CSW and patient discussed common feeling and emotions when being diagnosed with cancer, and the importance of support during treatment. CSW informed patient of the support team and support services at [facility]. CSW provided contact information and encouraged patient to call with any questions or concerns.  Identifications of barriers to care:  None noted  Availability of community resources: [facility], [place].  Clinical Social Worker follow up needed: Yes.   will check in w patient when he is at [facility] as possible  Beverely Pace, Home, [place] Worker Phone:  [phone] Cell:  [phone]

## 2020-03-14 ENCOUNTER — Other Ambulatory Visit: Payer: Self-pay | Admitting: Oncology

## 2020-03-15 ENCOUNTER — Inpatient Hospital Stay: Payer: BC Managed Care – PPO

## 2020-03-15 ENCOUNTER — Encounter: Payer: Self-pay | Admitting: *Deleted

## 2020-03-15 ENCOUNTER — Other Ambulatory Visit: Payer: Self-pay | Admitting: *Deleted

## 2020-03-15 ENCOUNTER — Telehealth: Payer: Self-pay | Admitting: *Deleted

## 2020-03-15 ENCOUNTER — Other Ambulatory Visit: Payer: Self-pay

## 2020-03-15 ENCOUNTER — Other Ambulatory Visit: Payer: Self-pay | Admitting: Oncology

## 2020-03-15 DIAGNOSIS — G893 Neoplasm related pain (acute) (chronic): Secondary | ICD-10-CM | POA: Diagnosis not present

## 2020-03-15 DIAGNOSIS — C250 Malignant neoplasm of head of pancreas: Secondary | ICD-10-CM | POA: Diagnosis present

## 2020-03-15 DIAGNOSIS — K5900 Constipation, unspecified: Secondary | ICD-10-CM | POA: Diagnosis not present

## 2020-03-15 DIAGNOSIS — Z5111 Encounter for antineoplastic chemotherapy: Secondary | ICD-10-CM | POA: Diagnosis present

## 2020-03-15 DIAGNOSIS — Z808 Family history of malignant neoplasm of other organs or systems: Secondary | ICD-10-CM | POA: Diagnosis not present

## 2020-03-15 LAB — CBC WITH DIFFERENTIAL (CANCER CENTER ONLY)
Abs Immature Granulocytes: 0.13 10*3/uL — ABNORMAL HIGH (ref 0.00–0.07)
Basophils Absolute: 0 10*3/uL (ref 0.0–0.1)
Basophils Relative: 0 %
Eosinophils Absolute: 0 10*3/uL (ref 0.0–0.5)
Eosinophils Relative: 0 %
HCT: 29 % — ABNORMAL LOW (ref 39.0–52.0)
Hemoglobin: 10.4 g/dL — ABNORMAL LOW (ref 13.0–17.0)
Immature Granulocytes: 1 %
Lymphocytes Relative: 5 %
Lymphs Abs: 0.9 10*3/uL (ref 0.7–4.0)
MCH: 28.1 pg (ref 26.0–34.0)
MCHC: 35.9 g/dL (ref 30.0–36.0)
MCV: 78.4 fL — ABNORMAL LOW (ref 80.0–100.0)
Monocytes Absolute: 2.2 10*3/uL — ABNORMAL HIGH (ref 0.1–1.0)
Monocytes Relative: 11 %
Neutro Abs: 16.1 10*3/uL — ABNORMAL HIGH (ref 1.7–7.7)
Neutrophils Relative %: 83 %
Platelet Count: 695 10*3/uL — ABNORMAL HIGH (ref 150–400)
RBC: 3.7 MIL/uL — ABNORMAL LOW (ref 4.22–5.81)
RDW: 12.1 % (ref 11.5–15.5)
WBC Count: 19.4 10*3/uL — ABNORMAL HIGH (ref 4.0–10.5)
nRBC: 0 % (ref 0.0–0.2)

## 2020-03-15 LAB — CMP (CANCER CENTER ONLY)
ALT: 26 U/L (ref 0–44)
AST: 34 U/L (ref 15–41)
Albumin: 2.7 g/dL — ABNORMAL LOW (ref 3.5–5.0)
Alkaline Phosphatase: 308 U/L — ABNORMAL HIGH (ref 38–126)
Anion gap: 9 (ref 5–15)
BUN: 13 mg/dL (ref 6–20)
CO2: 28 mmol/L (ref 22–32)
Calcium: 11.6 mg/dL — ABNORMAL HIGH (ref 8.9–10.3)
Chloride: 97 mmol/L — ABNORMAL LOW (ref 98–111)
Creatinine: 0.78 mg/dL (ref 0.61–1.24)
GFR, Est AFR Am: >60 mL/min (ref >60)
GFR, Estimated: >60 mL/min (ref >60)
Glucose, Bld: 136 mg/dL — ABNORMAL HIGH (ref 70–99)
Potassium: 4.5 mmol/L (ref 3.5–5.1)
Sodium: 134 mmol/L — ABNORMAL LOW (ref 135–145)
Total Bilirubin: 0.3 mg/dL (ref 0.3–1.2)
Total Protein: 7.6 g/dL (ref 6.5–8.1)

## 2020-03-15 MED ORDER — OXYCODONE HCL 5 MG PO TABS: ORAL_TABLET | ORAL | 0 refills | Status: DC

## 2020-03-15 NOTE — Progress Notes (Signed)
FMLA forms received via fax from [employer]. Forwarded to [place], disability specialist.

## 2020-03-15 NOTE — Telephone Encounter (Signed)
Informed wife that the cancer is causing his gas, belching and hiccups. Try OTC GasX or Gaviscon for the gas. His MS Contin has been increased to 30 mg every 12 hours and MD has sent in a new script for his oxycodone break-thru med. Informed her that the FMLA forms have been forwarded to disability specialist and it can take 7-10 business days to complete. Have asked the CSW to meet with them to apply for [employer] rides. Informed her it is doubtful he will qualify for in home care, but will discuss this at his next visit. Also sent in transportation request with [place] for future rides to office and treatment.

## 2020-03-15 NOTE — Progress Notes (Unsigned)
MD has ordered to add ferritin to draw on 7/6 and CMP on 03/18/20

## 2020-03-15 NOTE — Progress Notes (Signed)
Ca+ level significantly high. Sent [facility] scheduling message to get him in tomorrow for IV fluids and Zometa.

## 2020-03-16 ENCOUNTER — Inpatient Hospital Stay: Payer: BC Managed Care – PPO

## 2020-03-16 ENCOUNTER — Other Ambulatory Visit: Payer: Self-pay | Admitting: Radiology

## 2020-03-16 ENCOUNTER — Telehealth: Payer: Self-pay | Admitting: *Deleted

## 2020-03-16 ENCOUNTER — Other Ambulatory Visit: Payer: Self-pay

## 2020-03-16 ENCOUNTER — Inpatient Hospital Stay: Payer: BC Managed Care – PPO | Admitting: General Practice

## 2020-03-16 ENCOUNTER — Telehealth: Payer: Self-pay | Admitting: Oncology

## 2020-03-16 DIAGNOSIS — Z5111 Encounter for antineoplastic chemotherapy: Secondary | ICD-10-CM | POA: Diagnosis not present

## 2020-03-16 DIAGNOSIS — C250 Malignant neoplasm of head of pancreas: Secondary | ICD-10-CM

## 2020-03-16 LAB — CANCER ANTIGEN 19-9: CA 19-9: 13640 U/mL — ABNORMAL HIGH (ref 0–35)

## 2020-03-16 MED ORDER — ZOLEDRONIC ACID 4 MG/100ML IV SOLN
INTRAVENOUS | Status: AC
  Filled 2020-03-16: qty 100

## 2020-03-16 MED ORDER — ZOLEDRONIC ACID 4 MG/100ML IV SOLN
4.0000 mg | Freq: Once | INTRAVENOUS | Status: AC
  Administered 2020-03-16: 4 mg via INTRAVENOUS

## 2020-03-16 MED ORDER — SODIUM CHLORIDE 0.9 % IV SOLN
INTRAVENOUS | Status: AC
  Filled 2020-03-16 (×2): qty 250

## 2020-03-16 NOTE — Progress Notes (Signed)
[facility] CSW Progress Notes  Met w patient in infusion.  Discussed his transportation needs.  He does not have a working car at this time and depends on family for rides.  He was enrolled in [facility] Services and was given the card w the number to call to schedule needed rides.  CSW also completed an Access [place]/[employer] application so patient can access this service for non [facility] rides.  He is concerned about the status of paperwork for his short term employer disability program - CSW will message Rosalind Winston Spruiell RN to determine the status of paperwork for the [place] disability program.   , LCSW Clinical Social Worker Phone:  [phone]  

## 2020-03-16 NOTE — Progress Notes (Signed)
.   Pharmacist Chemotherapy Monitoring - Initial Assessment    Anticipated start date: 03/19/20   Regimen:  . Are orders appropriate based on the patient's diagnosis, regimen, and cycle? Yes . Does the plan date match the patient's scheduled date? Yes . Is the sequencing of drugs appropriate? Yes . Are the premedications appropriate for the patient's regimen? Yes . Prior Authorization for treatment is: Approved o If applicable, is the correct biosimilar selected based on the patient's insurance? not applicable  Organ Function and Labs: Marland Kitchen Are dose adjustments needed based on the patient's renal function, hepatic function, or hematologic function? No . Are appropriate labs ordered prior to the start of patient's treatment? Yes . Other organ system assessment, if indicated: N/A . The following baseline labs, if indicated, have been ordered: N/A  Dose Assessment: . Are the drug doses appropriate? Yes . Are the following correct: o Drug concentrations Yes o IV fluid compatible with drug Yes o Administration routes Yes o Timing of therapy Yes . If applicable, does the patient have documented access for treatment and/or plans for port-a-cath placement? not applicable . If applicable, have lifetime cumulative doses been properly documented and assessed? not applicable Lifetime Dose Tracking  No doses have been documented on this patient for the following tracked chemicals: Doxorubicin, Epirubicin, Idarubicin, Daunorubicin, Mitoxantrone, Bleomycin, Oxaliplatin, Carboplatin, Liposomal Doxorubicin  o   Toxicity Monitoring/Prevention: . The patient has the following take home antiemetics prescribed: Ondansetron . The patient has the following take home medications prescribed: N/A . Medication allergies and previous infusion related reactions, if applicable, have been reviewed and addressed. Yes . The patient's current medication list has been assessed for drug-drug interactions with their  chemotherapy regimen. no significant drug-drug interactions were identified on review.  Order Review: . Are the treatment plan orders signed? Yes . Is the patient scheduled to see a provider prior to their treatment? Yes  I verify that I have reviewed each item in the above checklist and answered each question accordingly.  Joe [name] 03/16/2020 11:47 AM

## 2020-03-16 NOTE — Patient Instructions (Signed)
Zoledronic Acid injection (Hypercalcemia, Oncology) What is this medicine? ZOLEDRONIC ACID (ZOE le dron ik AS id) lowers the amount of calcium loss from bone. It is used to treat too much calcium in your blood from cancer. It is also used to prevent complications of cancer that has spread to the bone. This medicine may be used for other purposes; ask your health care provider or pharmacist if you have questions. COMMON BRAND NAME(S): Zometa What should I tell my health care provider before I take this medicine? They need to know if you have any of these conditions:  aspirin-sensitive asthma  cancer, especially if you are receiving medicines used to treat cancer  dental disease or wear dentures  infection  kidney disease  receiving corticosteroids like dexamethasone or prednisone  an unusual or allergic reaction to zoledronic acid, other medicines, foods, dyes, or preservatives  pregnant or trying to get pregnant  breast-feeding How should I use this medicine? This medicine is for infusion into a vein. It is given by a health care professional in a hospital or clinic setting. Talk to your pediatrician regarding the use of this medicine in children. Special care may be needed. Overdosage: If you think you have taken too much of this medicine contact a poison control center or emergency room at once. NOTE: This medicine is only for you. Do not share this medicine with others. What if I miss a dose? It is important not to miss your dose. Call your doctor or health care professional if you are unable to keep an appointment. What may interact with this medicine?  certain antibiotics given by injection  NSAIDs, medicines for pain and inflammation, like ibuprofen or naproxen  some diuretics like bumetanide, furosemide  teriparatide  thalidomide This list may not describe all possible interactions. Give your health care provider a list of all the medicines, herbs, non-prescription  drugs, or dietary supplements you use. Also tell them if you smoke, drink alcohol, or use illegal drugs. Some items may interact with your medicine. What should I watch for while using this medicine? Visit your doctor or health care professional for regular checkups. It may be some time before you see the benefit from this medicine. Do not stop taking your medicine unless your doctor tells you to. Your doctor may order blood tests or other tests to see how you are doing. Women should inform their doctor if they wish to become pregnant or think they might be pregnant. There is a potential for serious side effects to an unborn child. Talk to your health care professional or pharmacist for more information. You should make sure that you get enough calcium and vitamin D while you are taking this medicine. Discuss the foods you eat and the vitamins you take with your health care professional. Some people who take this medicine have severe bone, joint, and/or muscle pain. This medicine may also increase your risk for jaw problems or a broken thigh bone. Tell your doctor right away if you have severe pain in your jaw, bones, joints, or muscles. Tell your doctor if you have any pain that does not go away or that gets worse. Tell your dentist and dental surgeon that you are taking this medicine. You should not have major dental surgery while on this medicine. See your dentist to have a dental exam and fix any dental problems before starting this medicine. Take good care of your teeth while on this medicine. Make sure you see your dentist for regular follow-up   appointments. What side effects may I notice from receiving this medicine? Side effects that you should report to your doctor or health care professional as soon as possible:  allergic reactions like skin rash, itching or hives, swelling of the face, lips, or tongue  anxiety, confusion, or depression  breathing problems  changes in vision  eye  pain  feeling faint or lightheaded, falls  jaw pain, especially after dental work  mouth sores  muscle cramps, stiffness, or weakness  redness, blistering, peeling or loosening of the skin, including inside the mouth  trouble passing urine or change in the amount of urine Side effects that usually do not require medical attention (report to your doctor or health care professional if they continue or are bothersome):  bone, joint, or muscle pain  constipation  diarrhea  fever  hair loss  irritation at site where injected  loss of appetite  nausea, vomiting  stomach upset  trouble sleeping  trouble swallowing  weak or tired This list may not describe all possible side effects. Call your doctor for medical advice about side effects. You may report side effects to FDA at 1-800-FDA-1088. Where should I keep my medicine? This drug is given in a hospital or clinic and will not be stored at home. NOTE: This sheet is a summary. It may not cover all possible information. If you have questions about this medicine, talk to your doctor, pharmacist, or health care provider.  2020 Elsevier/Gold Standard (2014-01-23 14:19:39)  

## 2020-03-16 NOTE — Telephone Encounter (Signed)
Scheduled appt per 7/6 sch msg - pt wife is aware of appt scheduled.

## 2020-03-16 NOTE — Telephone Encounter (Signed)
Informed of elevated Ca+ level and what dangers and s/s are. Explained IV hydration, pushing po fluids at home and giving Zometa will correct this. He will be called to come in today for IV fluids and the zometa. He agrees to plan.

## 2020-03-17 ENCOUNTER — Telehealth: Payer: Self-pay | Admitting: General Practice

## 2020-03-17 ENCOUNTER — Ambulatory Visit (HOSPITAL_COMMUNITY): Payer: BC Managed Care – PPO

## 2020-03-17 ENCOUNTER — Other Ambulatory Visit (HOSPITAL_COMMUNITY): Payer: BC Managed Care – PPO

## 2020-03-17 ENCOUNTER — Telehealth: Payer: Self-pay | Admitting: *Deleted

## 2020-03-17 NOTE — Telephone Encounter (Signed)
Patient drank Ensure and was drinking water upon arrival for his port placement today. Had to cancel and reschedule for 03/24/20 (first available). MD notified. Can try to give 1st treatment peripherally.

## 2020-03-17 NOTE — Telephone Encounter (Signed)
[facility] CSW Progress Notes  Patient has been pre approved for [employer] [place] transportation.  Notified by phone that he can arrange rides as needed.  Edwyna Shell, LCSW Clinical Social Worker Phone:  [phone] Cell:  [phone]

## 2020-03-18 ENCOUNTER — Inpatient Hospital Stay (HOSPITAL_BASED_OUTPATIENT_CLINIC_OR_DEPARTMENT_OTHER): Payer: BC Managed Care – PPO | Admitting: Nurse Practitioner

## 2020-03-18 ENCOUNTER — Encounter: Payer: Self-pay | Admitting: Nurse Practitioner

## 2020-03-18 ENCOUNTER — Telehealth: Payer: Self-pay | Admitting: Nurse Practitioner

## 2020-03-18 ENCOUNTER — Inpatient Hospital Stay: Payer: BC Managed Care – PPO

## 2020-03-18 ENCOUNTER — Inpatient Hospital Stay: Payer: BC Managed Care – PPO | Admitting: Nurse Practitioner

## 2020-03-18 ENCOUNTER — Other Ambulatory Visit: Payer: Self-pay

## 2020-03-18 VITALS — BP 104/63 | HR 95 | Temp 99.5°F | Resp 17 | Ht 73.0 in | Wt 141.1 lb

## 2020-03-18 DIAGNOSIS — C250 Malignant neoplasm of head of pancreas: Secondary | ICD-10-CM

## 2020-03-18 DIAGNOSIS — Z5111 Encounter for antineoplastic chemotherapy: Secondary | ICD-10-CM | POA: Diagnosis not present

## 2020-03-18 LAB — CMP (CANCER CENTER ONLY)
ALT: 43 U/L (ref 0–44)
AST: 59 U/L — ABNORMAL HIGH (ref 15–41)
Albumin: 2.4 g/dL — ABNORMAL LOW (ref 3.5–5.0)
Alkaline Phosphatase: 444 U/L — ABNORMAL HIGH (ref 38–126)
Anion gap: 12 (ref 5–15)
BUN: 13 mg/dL (ref 6–20)
CO2: 31 mmol/L (ref 22–32)
Calcium: 9.2 mg/dL (ref 8.9–10.3)
Chloride: 92 mmol/L — ABNORMAL LOW (ref 98–111)
Creatinine: 0.72 mg/dL (ref 0.61–1.24)
GFR, Est AFR Am: >60 mL/min (ref >60)
GFR, Estimated: >60 mL/min (ref >60)
Glucose, Bld: 114 mg/dL — ABNORMAL HIGH (ref 70–99)
Potassium: 3.9 mmol/L (ref 3.5–5.1)
Sodium: 135 mmol/L (ref 135–145)
Total Bilirubin: 0.4 mg/dL (ref 0.3–1.2)
Total Protein: 6.9 g/dL (ref 6.5–8.1)

## 2020-03-18 LAB — FERRITIN: Ferritin: 2392 ng/mL — ABNORMAL HIGH (ref 24–336)

## 2020-03-18 MED ORDER — OXYCODONE HCL 5 MG PO TABS: ORAL_TABLET | ORAL | 0 refills | Status: DC

## 2020-03-18 NOTE — Progress Notes (Signed)
°  [facility] OFFICE PROGRESS NOTE   Diagnosis: Pancreas cancer  INTERVAL HISTORY:   Joe [name] returns as scheduled.  He was unable to have the Port-A-Cath placed yesterday due to oral intake just prior to the procedure.  Port-A-Cath placement has been rescheduled to the first available appointment on 03/24/2020.  He does not want to begin chemotherapy without a Port-A-Cath.  He continues to have abdominal pain.  He has not increased MS Contin from 15 mg to 30 mg.  He continues oxycodone about every 4 hours.  Objective:  Vital signs in last 24 hours:  Blood pressure 104/63, pulse 95, temperature 99.5 F (37.5 C), temperature source Temporal, resp. rate 17, height 6\' 1"  (1.854 m), weight 141 lb 1.6 oz (64 kg), SpO2 100 %.    HEENT: No thrush or ulcers. Resp: Lungs clear bilaterally. Cardio: Regular rate and rhythm. GI: No hepatomegaly. Vascular: No leg edema.   Lab Results:  Lab Results  Component Value Date   WBC 19.4 (H) 03/15/2020   HGB 10.4 (L) 03/15/2020   HCT 29.0 (L) 03/15/2020   MCV 78.4 (L) 03/15/2020   PLT 695 (H) 03/15/2020   NEUTROABS 16.1 (H) 03/15/2020    Imaging:  No results found.  Medications: I have reviewed the patient's current medications.  Assessment/Plan: 1. Pancreas cancer-stage IV ? CT abdomen/pelvis 01/22/2020-multiple hypoattenuating/hypoenhancing liver lesions-indeterminate ? CT abdomen/pelvis 02/27/2020-ill-defined pancreas head mass, new and enlarging extensive hepatic metastases, retroperitoneal adenopathy ? MRI abdomen 02/28/2020-mass at the junction and head of the pancreas body, no vascular invasion, multiple hepatic metastases, retroperitoneal adenopathy ? Ultrasound-guided biopsy of a right liver lesion 03/01/2020-poorly differentiated carcinoma with necrosis, cytokeratin 7, cytokeratin 20, and GATA-3 positive 2. Pain secondary to #1 3. Family history of cancer-father with adrenal cancer 4. Constipation-likely secondary  to narcotic analgesics 5. Hypercalcemia 03/15/2020.  Zometa 03/16/2020.   Disposition: Joe [name] appears stable.  He does not want to begin chemotherapy before the Port-A-Cath is placed.  We have canceled the chemotherapy scheduled for tomorrow.  He will have a Port-A-Cath placed on 03/24/2020.  We will see him on 03/25/2020 with the plan to proceed with cycle 1 gemcitabine/Abraxane that day.  He had hypercalcemia on labs 03/15/2020.  He received Zometa the following day.  Review of the chemistry panel today shows improvement in the calcium level (corrected for albumin).  He will increase MS Contin from 15 mg twice daily to 30 mg twice daily, continue oxycodone as needed.  New oxycodone prescription sent to pharmacy.  He will return for lab, follow-up, cycle 1 gemcitabine/Abraxane in 1 week.    Ned Card ANP/GNP-BC   03/18/2020  4:03 PM

## 2020-03-18 NOTE — Telephone Encounter (Signed)
Scheduled per los. Gave avs and calendar  

## 2020-03-18 NOTE — Progress Notes (Signed)
Call to [facility] and spoke w/Andrew, PharmD: Only #31 oxycodone 5 mg tablets were dispensed on 03/15/20 since that was all the store had in stock that day.

## 2020-03-18 NOTE — Progress Notes (Signed)
Met with patient at registration to introduce myself as Arboriculturist and to offer available resources.  Discussed one-time $1000 Radio broadcast assistant to assist with personal expenses while going through treatment. Also discussed available copay assistance for treatment drugs if he has not met his ded/OOP. Advised what would be needed to apply. He verbalized understanding.   Gave him my card if he is interested in applying and for any additional financial questions or concerns.

## 2020-03-19 ENCOUNTER — Inpatient Hospital Stay: Payer: BC Managed Care – PPO

## 2020-03-21 ENCOUNTER — Telehealth: Payer: Self-pay | Admitting: Nutrition

## 2020-03-21 ENCOUNTER — Telehealth: Payer: Self-pay | Admitting: *Deleted

## 2020-03-21 ENCOUNTER — Inpatient Hospital Stay: Payer: BC Managed Care – PPO | Admitting: Nutrition

## 2020-03-21 NOTE — Telephone Encounter (Signed)
59 year old male diagnosed with stage IV pancreas cancer receiving gemcitabine and Abraxane and followed by Dr. [name].  Past medical history reviewed.  Medications include Rolaids, Creon, Zofran, Phenergan, Senokot-S, and sorbitol.  Labs include glucose 114 and albumin 2.4.  Height: 6 feet 1 inch. Weight: 141.1 pounds on July 9. Usual body weight: 172 pounds in February 2021. BMI: 18.62.  Patient reports nausea is intermittent but generally not controlled on nausea medicine.  He continues to have abdominal cramping and pain. Reports Creon does not seem to help. Reports right now is eating a peach and enjoys fruit. Reports constipation is resolved. He will drink Ensure occasionally and understands it is a good source of protein.  Nutrition diagnosis: Food and nutrition related knowledge deficit related to pancreas cancer as evidenced by no prior need for nutrition related information.  Intervention: Educated patient to consume small meals and snacks consisting of high-calorie, high-protein foods frequently throughout the day. Provided examples of high-calorie high-protein foods. Recommended patient continue oral nutrition supplements. Educated on tips for improving nausea and stressed importance of bowel regimen. Encourage patient to contact provider if nausea is not controlled on nausea medicines. I will mail nutrition fact sheets to patient at home address. I provided my contact information for questions.  Monitoring, evaluation, goals: Patient will tolerate increased calories and protein to minimize weight loss throughout treatment.  Next visit: Patient agrees to contact me.  No follow-up has been scheduled.  Please refer back to RD if patient needs further assistance.  **Disclaimer: This note was dictated with voice recognition software. Similar sounding words can inadvertently be transcribed and this note may contain transcription errors which may not have been corrected  upon publication of note.**

## 2020-03-21 NOTE — Progress Notes (Signed)
See telephone call

## 2020-03-21 NOTE — Telephone Encounter (Signed)
Family member had called Saturday night with patient in pain and swelling in his right foot. Called wife to f/u and she reports he is better. Pain under control and foot is not swollen. Instructed her to call for uncontrolled pain or if he develops swelling in a leg due to high risk for DVT.

## 2020-03-22 ENCOUNTER — Other Ambulatory Visit: Payer: Self-pay | Admitting: Radiology

## 2020-03-23 ENCOUNTER — Other Ambulatory Visit: Payer: Self-pay | Admitting: Radiology

## 2020-03-24 ENCOUNTER — Ambulatory Visit (HOSPITAL_COMMUNITY)
Admission: RE | Admit: 2020-03-24 | Discharge: 2020-03-24 | Disposition: A | Discharge status: Home / Self Care | Payer: BC Managed Care – PPO | Source: Ambulatory Visit | Attending: Oncology | Admitting: Oncology

## 2020-03-24 ENCOUNTER — Other Ambulatory Visit: Payer: Self-pay | Admitting: Oncology

## 2020-03-24 ENCOUNTER — Encounter (HOSPITAL_COMMUNITY): Payer: Self-pay

## 2020-03-24 ENCOUNTER — Other Ambulatory Visit: Payer: Self-pay

## 2020-03-24 DIAGNOSIS — C250 Malignant neoplasm of head of pancreas: Secondary | ICD-10-CM

## 2020-03-24 DIAGNOSIS — C259 Malignant neoplasm of pancreas, unspecified: Secondary | ICD-10-CM | POA: Insufficient documentation

## 2020-03-24 DIAGNOSIS — Z79899 Other long term (current) drug therapy: Secondary | ICD-10-CM | POA: Diagnosis not present

## 2020-03-24 DIAGNOSIS — Z87891 Personal history of nicotine dependence: Secondary | ICD-10-CM | POA: Insufficient documentation

## 2020-03-24 HISTORY — PX: IR IMAGING GUIDED PORT INSERTION: IMG5740

## 2020-03-24 LAB — CBC WITH DIFFERENTIAL/PLATELET
Abs Immature Granulocytes: 0.56 10*3/uL — ABNORMAL HIGH (ref 0.00–0.07)
Basophils Absolute: 0.1 10*3/uL (ref 0.0–0.1)
Basophils Relative: 0 %
Eosinophils Absolute: 0 10*3/uL (ref 0.0–0.5)
Eosinophils Relative: 0 %
HCT: 32.9 % — ABNORMAL LOW (ref 39.0–52.0)
Hemoglobin: 11.5 g/dL — ABNORMAL LOW (ref 13.0–17.0)
Immature Granulocytes: 2 %
Lymphocytes Relative: 4 %
Lymphs Abs: 1.2 10*3/uL (ref 0.7–4.0)
MCH: 27.4 pg (ref 26.0–34.0)
MCHC: 35 g/dL (ref 30.0–36.0)
MCV: 78.5 fL — ABNORMAL LOW (ref 80.0–100.0)
Monocytes Absolute: 2.5 10*3/uL — ABNORMAL HIGH (ref 0.1–1.0)
Monocytes Relative: 8 %
Neutro Abs: 27 10*3/uL — ABNORMAL HIGH (ref 1.7–7.7)
Neutrophils Relative %: 86 %
Platelets: 720 10*3/uL — ABNORMAL HIGH (ref 150–400)
RBC: 4.19 MIL/uL — ABNORMAL LOW (ref 4.22–5.81)
RDW: 13.1 % (ref 11.5–15.5)
WBC: 31.4 10*3/uL — ABNORMAL HIGH (ref 4.0–10.5)
nRBC: 0 % (ref 0.0–0.2)

## 2020-03-24 MED ORDER — HEPARIN SOD (PORK) LOCK FLUSH 100 UNIT/ML IV SOLN
INTRAVENOUS | Status: AC | PRN
  Administered 2020-03-24: 500 [IU] via INTRAVENOUS

## 2020-03-24 MED ORDER — MIDAZOLAM HCL 2 MG/2ML IJ SOLN
INTRAMUSCULAR | Status: AC | PRN
  Administered 2020-03-24 (×2): 0.5 mg via INTRAVENOUS
  Administered 2020-03-24: 1 mg via INTRAVENOUS

## 2020-03-24 MED ORDER — MIDAZOLAM HCL 2 MG/2ML IJ SOLN
INTRAMUSCULAR | Status: AC
  Filled 2020-03-24: qty 2

## 2020-03-24 MED ORDER — LIDOCAINE-EPINEPHRINE 1 %-1:100000 IJ SOLN
INTRAMUSCULAR | Status: AC
  Filled 2020-03-24: qty 1

## 2020-03-24 MED ORDER — CEFAZOLIN SODIUM-DEXTROSE 2-4 GM/100ML-% IV SOLN: 2.0000 g | INTRAVENOUS | Status: AC

## 2020-03-24 MED ORDER — HEPARIN SOD (PORK) LOCK FLUSH 100 UNIT/ML IV SOLN
INTRAVENOUS | Status: AC
  Filled 2020-03-24: qty 5

## 2020-03-24 MED ORDER — SODIUM CHLORIDE 0.9 % IV SOLN: INTRAVENOUS | Status: DC

## 2020-03-24 MED ORDER — FENTANYL CITRATE (PF) 100 MCG/2ML IJ SOLN
INTRAMUSCULAR | Status: AC | PRN
  Administered 2020-03-24: 25 ug via INTRAVENOUS
  Administered 2020-03-24: 50 ug via INTRAVENOUS
  Administered 2020-03-24: 25 ug via INTRAVENOUS

## 2020-03-24 MED ORDER — LIDOCAINE-EPINEPHRINE 1 %-1:100000 IJ SOLN
INTRAMUSCULAR | Status: AC | PRN
  Administered 2020-03-24 (×2): 10 mL via INTRADERMAL

## 2020-03-24 MED ORDER — FENTANYL CITRATE (PF) 100 MCG/2ML IJ SOLN
INTRAMUSCULAR | Status: AC
  Filled 2020-03-24: qty 2

## 2020-03-24 MED ORDER — CEFAZOLIN SODIUM-DEXTROSE 2-4 GM/100ML-% IV SOLN
INTRAVENOUS | Status: AC
  Administered 2020-03-24: 2 g via INTRAVENOUS
  Filled 2020-03-24: qty 100
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
## 2020-03-24 NOTE — Discharge Instructions (Signed)

## 2020-03-24 NOTE — Procedures (Signed)
Pre Procedure Dx: Poor venous access Post Procedural Dx: Same  Successful placement of right IJ approach port-a-cath with tip at the superior caval atrial junction. The catheter is ready for immediate use.  Estimated Blood Loss: Minimal  Complications: None immediate.  Jay Marycruz Boehner, MD Pager #: [phone]   

## 2020-03-25 ENCOUNTER — Inpatient Hospital Stay: Payer: BC Managed Care – PPO

## 2020-03-25 ENCOUNTER — Other Ambulatory Visit: Payer: Self-pay

## 2020-03-25 ENCOUNTER — Telehealth: Payer: Self-pay

## 2020-03-25 ENCOUNTER — Inpatient Hospital Stay (HOSPITAL_BASED_OUTPATIENT_CLINIC_OR_DEPARTMENT_OTHER): Payer: BC Managed Care – PPO | Admitting: Nurse Practitioner

## 2020-03-25 ENCOUNTER — Encounter: Payer: Self-pay | Admitting: Nurse Practitioner

## 2020-03-25 VITALS — BP 112/65 | HR 109 | Temp 99.3°F | Resp 17 | Ht 73.0 in | Wt 141.4 lb

## 2020-03-25 DIAGNOSIS — C250 Malignant neoplasm of head of pancreas: Secondary | ICD-10-CM | POA: Diagnosis not present

## 2020-03-25 DIAGNOSIS — Z5111 Encounter for antineoplastic chemotherapy: Secondary | ICD-10-CM | POA: Diagnosis not present

## 2020-03-25 MED ORDER — SODIUM CHLORIDE 0.9 % IV SOLN
Freq: Once | INTRAVENOUS | Status: AC
  Filled 2020-03-25: qty 250

## 2020-03-25 MED ORDER — HEPARIN SOD (PORK) LOCK FLUSH 100 UNIT/ML IV SOLN
500.0000 [IU] | Freq: Once | INTRAVENOUS | Status: AC | PRN
  Administered 2020-03-25: 500 [IU]
  Filled 2020-03-25: qty 5

## 2020-03-25 MED ORDER — PROCHLORPERAZINE MALEATE 10 MG PO TABS
ORAL_TABLET | ORAL | Status: AC
  Filled 2020-03-25: qty 1

## 2020-03-25 MED ORDER — PACLITAXEL PROTEIN-BOUND CHEMO INJECTION 100 MG
100.0000 mg/m2 | Freq: Once | INTRAVENOUS | Status: AC
  Administered 2020-03-25: 175 mg via INTRAVENOUS
  Filled 2020-03-25: qty 35

## 2020-03-25 MED ORDER — PROCHLORPERAZINE MALEATE 10 MG PO TABS
10.0000 mg | ORAL_TABLET | Freq: Once | ORAL | Status: AC
  Administered 2020-03-25: 10 mg via ORAL

## 2020-03-25 MED ORDER — SODIUM CHLORIDE 0.9% FLUSH
10.0000 mL | INTRAVENOUS | Status: DC | PRN
  Administered 2020-03-25: 10 mL
  Filled 2020-03-25: qty 10

## 2020-03-25 MED ORDER — SODIUM CHLORIDE 0.9 % IV SOLN
1000.0000 mg/m2 | Freq: Once | INTRAVENOUS | Status: AC
  Administered 2020-03-25: 1824 mg via INTRAVENOUS
  Filled 2020-03-25: qty 47.97

## 2020-03-25 NOTE — Progress Notes (Signed)
Pt completed first day of Gemzar, Abraxane without any issues. Nutrition offered. Education offered.

## 2020-03-25 NOTE — Telephone Encounter (Signed)
Received message from patient and daughter with questions regarding chemotherapy. Follow up call and they asked what the patient could eat or drink prior to his appt today. Made them both aware that there are no restrictions. Verbalized understanding and no other questions or concerns.

## 2020-03-25 NOTE — Progress Notes (Signed)
[place] OFFICE PROGRESS NOTE   Diagnosis: Pancreas cancer  INTERVAL HISTORY:   Joe [name] returns as scheduled.  He had a Port-A-Cath placed yesterday.  He notes improved pain control since increasing the dose of MS Contin.  He continues oxycodone as needed.  Bowels moving regularly.  No nausea or vomiting.  Objective:  Vital signs in last 24 hours:  Blood pressure 112/65, pulse (!) 109, temperature 99.3 F (37.4 C), temperature source Temporal, resp. rate 17, height '6\' 1"'$  (1.854 m), weight 141 lb 6.4 oz (64.1 kg), SpO2 99 %.    Resp: Lungs clear bilaterally. Cardio: Regular rate and rhythm. GI: Abdomen is soft, tender at the upper abdomen.  No hepatomegaly. Vascular: No leg edema. Neuro: Alert and oriented. Port-A-Cath site is covered with a gauze bandage.  Lab Results:  Lab Results  Component Value Date   WBC 31.4 (H) 03/24/2020   HGB 11.5 (L) 03/24/2020   HCT 32.9 (L) 03/24/2020   MCV 78.5 (L) 03/24/2020   PLT 720 (H) 03/24/2020   NEUTROABS 27.0 (H) 03/24/2020    Imaging:  IR IMAGING GUIDED PORT INSERTION  Result Date: 03/24/2020 INDICATION: Metastatic pancreatic cancer. In need durable intravenous access for chemotherapy administration. EXAM: IMPLANTED PORT A CATH PLACEMENT WITH ULTRASOUND AND FLUOROSCOPIC GUIDANCE COMPARISON:  None. MEDICATIONS: Ancef 2 gm IV; The antibiotic was administered within an appropriate time interval prior to skin puncture. ANESTHESIA/SEDATION: Moderate (conscious) sedation was employed during this procedure. A total of Versed 2 mg and Fentanyl 100 mcg was administered intravenously. Moderate Sedation Time: 23 minutes. The patient's level of consciousness and vital signs were monitored continuously by radiology nursing throughout the procedure under my direct supervision. CONTRAST:  None FLUOROSCOPY TIME:  12 seconds (2 mGy) COMPLICATIONS: None immediate. PROCEDURE: The procedure, risks, benefits, and alternatives were  explained to the patient. Questions regarding the procedure were encouraged and answered. The patient understands and consents to the procedure. The right neck and chest were prepped with chlorhexidine in a sterile fashion, and a sterile drape was applied covering the operative field. Maximum barrier sterile technique with sterile gowns and gloves were used for the procedure. A timeout was performed prior to the initiation of the procedure. Local anesthesia was provided with 1% lidocaine with epinephrine. After creating a small venotomy incision, a micropuncture kit was utilized to access the internal jugular vein. Real-time ultrasound guidance was utilized for vascular access including the acquisition of a permanent ultrasound image documenting patency of the accessed vessel. The microwire was utilized to measure appropriate catheter length. A subcutaneous port pocket was then created along the upper chest wall utilizing a combination of sharp and blunt dissection. The pocket was irrigated with sterile saline. A single lumen "Slim" sized power injectable port was chosen for placement. The 8 Fr catheter was tunneled from the port pocket site to the venotomy incision. The port was placed in the pocket. The external catheter was trimmed to appropriate length. At the venotomy, an 8 Fr peel-away sheath was placed over a guidewire under fluoroscopic guidance. The catheter was then placed through the sheath and the sheath was removed. Final catheter positioning was confirmed and documented with a fluoroscopic spot radiograph. The port was accessed with a Huber needle, aspirated and flushed with heparinized saline. The venotomy site was closed with an interrupted 4-0 Vicryl suture. The port pocket incision was closed with interrupted 2-0 Vicryl suture. The skin was opposed with a running subcuticular 4-0 Vicryl suture. Dermabond and Steri-strips were applied  to both incisions. Dressings were applied. The patient tolerated  the procedure well without immediate post procedural complication. FINDINGS: After catheter placement, the tip lies within the superior cavoatrial junction. The catheter aspirates and flushes normally and is ready for immediate use. IMPRESSION: Successful placement of a right internal jugular approach power injectable Port-A-Cath. The catheter is ready for immediate use. Electronically Signed   By: Sandi Mariscal M.D.   On: 03/24/2020 15:47    Medications: I have reviewed the patient's current medications.  Assessment/Plan: 1. Pancreas cancer-stage IV ? CT abdomen/pelvis 01/22/2020-multiple hypoattenuating/hypoenhancing liver lesions-indeterminate ? CT abdomen/pelvis 02/27/2020-ill-defined pancreas head mass, new and enlarging extensive hepatic metastases, retroperitoneal adenopathy ? MRI abdomen 02/28/2020-mass at the junction and head of the pancreas body, no vascular invasion, multiple hepatic metastases, retroperitoneal adenopathy ? Ultrasound-guided biopsy of a right liver lesion 03/01/2020-poorly differentiated carcinoma with necrosis, cytokeratin 7, cytokeratin 20, and GATA-3 positive ? Cycle 1 gemcitabine/Abraxane 03/25/2020 2. Pain secondary to #1 3. Family history of cancer-father with adrenal cancer 4. Constipation-likely secondary to narcotic analgesics 5. Hypercalcemia 03/15/2020.  Zometa 03/16/2020.  Disposition: [name] has metastatic pancreas cancer.  He is scheduled to begin treatment with gemcitabine/Abraxane.  We again reviewed potential toxicities.  He agrees to proceed with cycle 1 today as scheduled.  CBC from yesterday reviewed.  Counts adequate to proceed with treatment.  Elevated white count may be due to a leukemoid reaction.  No symptoms to suggest an infection.  Chemistry panel from 03/18/2020 also reviewed, adequate to proceed with treatment.  He will return for lab, follow-up, cycle 2 gemcitabine/Abraxane in 2 weeks.  He will contact the office in the interim with any  problems.    Ned Card ANP/GNP-BC   03/25/2020  1:44 PM

## 2020-03-25 NOTE — Patient Instructions (Signed)
Gemcitabine injection What is this medicine? GEMCITABINE (jem SYE ta been) is a chemotherapy drug. This medicine is used to treat many types of cancer like breast cancer, lung cancer, pancreatic cancer, and ovarian cancer. This medicine may be used for other purposes; ask your health care provider or pharmacist if you have questions. COMMON BRAND NAME(S): Gemzar, Infugem What should I tell my health care provider before I take this medicine? They need to know if you have any of these conditions:  blood disorders  infection  kidney disease  liver disease  lung or breathing disease, like asthma  recent or ongoing radiation therapy  an unusual or allergic reaction to gemcitabine, other chemotherapy, other medicines, foods, dyes, or preservatives  pregnant or trying to get pregnant  breast-feeding How should I use this medicine? This drug is given as an infusion into a vein. It is administered in a hospital or clinic by a specially trained health care professional. Talk to your pediatrician regarding the use of this medicine in children. Special care may be needed. Overdosage: If you think you have taken too much of this medicine contact a poison control center or emergency room at once. NOTE: This medicine is only for you. Do not share this medicine with others. What if I miss a dose? It is important not to miss your dose. Call your doctor or health care professional if you are unable to keep an appointment. What may interact with this medicine?  medicines to increase blood counts like filgrastim, pegfilgrastim, sargramostim  some other chemotherapy drugs like cisplatin  vaccines Talk to your doctor or health care professional before taking any of these medicines:  acetaminophen  aspirin  ibuprofen  ketoprofen  naproxen This list may not describe all possible interactions. Give your health care provider a list of all the medicines, herbs, non-prescription drugs, or  dietary supplements you use. Also tell them if you smoke, drink alcohol, or use illegal drugs. Some items may interact with your medicine. What should I watch for while using this medicine? Visit your doctor for checks on your progress. This drug may make you feel generally unwell. This is not uncommon, as chemotherapy can affect healthy cells as well as cancer cells. Report any side effects. Continue your course of treatment even though you feel ill unless your doctor tells you to stop. In some cases, you may be given additional medicines to help with side effects. Follow all directions for their use. Call your doctor or health care professional for advice if you get a fever, chills or sore throat, or other symptoms of a cold or flu. Do not treat yourself. This drug decreases your body's ability to fight infections. Try to avoid being around people who are sick. This medicine may increase your risk to bruise or bleed. Call your doctor or health care professional if you notice any unusual bleeding. Be careful brushing and flossing your teeth or using a toothpick because you may get an infection or bleed more easily. If you have any dental work done, tell your dentist you are receiving this medicine. Avoid taking products that contain aspirin, acetaminophen, ibuprofen, naproxen, or ketoprofen unless instructed by your doctor. These medicines may hide a fever. Do not become pregnant while taking this medicine or for 6 months after stopping it. Women should inform their doctor if they wish to become pregnant or think they might be pregnant. Men should not father a child while taking this medicine and for 3 months after stopping it.   There is a potential for serious side effects to an unborn child. Talk to your health care professional or pharmacist for more information. Do not breast-feed an infant while taking this medicine or for at least 1 week after stopping it. Men should inform their doctors if they wish  to father a child. This medicine may lower sperm counts. Talk with your doctor or health care professional if you are concerned about your fertility. What side effects may I notice from receiving this medicine? Side effects that you should report to your doctor or health care professional as soon as possible:  allergic reactions like skin rash, itching or hives, swelling of the face, lips, or tongue  breathing problems  pain, redness, or irritation at site where injected  signs and symptoms of a dangerous change in heartbeat or heart rhythm like chest pain; dizziness; fast or irregular heartbeat; palpitations; feeling faint or lightheaded, falls; breathing problems  signs of decreased platelets or bleeding - bruising, pinpoint red spots on the skin, black, tarry stools, blood in the urine  signs of decreased red blood cells - unusually weak or tired, feeling faint or lightheaded, falls  signs of infection - fever or chills, cough, sore throat, pain or difficulty passing urine  signs and symptoms of kidney injury like trouble passing urine or change in the amount of urine  signs and symptoms of liver injury like dark yellow or brown urine; general ill feeling or flu-like symptoms; light-colored stools; loss of appetite; nausea; right upper belly pain; unusually weak or tired; yellowing of the eyes or skin  swelling of ankles, feet, hands Side effects that usually do not require medical attention (report to your doctor or health care professional if they continue or are bothersome):  constipation  diarrhea  hair loss  loss of appetite  nausea  rash  vomiting This list may not describe all possible side effects. Call your doctor for medical advice about side effects. You may report side effects to FDA at 1-800-FDA-1088. Where should I keep my medicine? This drug is given in a hospital or clinic and will not be stored at home. NOTE: This sheet is a summary. It may not cover all  possible information. If you have questions about this medicine, talk to your doctor, pharmacist, or health care provider.  2020 Elsevier/Gold Standard (2017-11-20 18:06:11)    Nanoparticle Albumin-Bound Paclitaxel injection What is this medicine? NANOPARTICLE ALBUMIN-BOUND PACLITAXEL (Na no PAHR ti kuhl al BYOO muhn-bound PAK li TAX el) is a chemotherapy drug. It targets fast dividing cells, like cancer cells, and causes these cells to die. This medicine is used to treat advanced breast cancer, lung cancer, and pancreatic cancer. This medicine may be used for other purposes; ask your health care provider or pharmacist if you have questions. COMMON BRAND NAME(S): Abraxane What should I tell my health care provider before I take this medicine? They need to know if you have any of these conditions:  kidney disease  liver disease  low blood counts, like low white cell, platelet, or red cell counts  lung or breathing disease, like asthma  tingling of the fingers or toes, or other nerve disorder  an unusual or allergic reaction to paclitaxel, albumin, other chemotherapy, other medicines, foods, dyes, or preservatives  pregnant or trying to get pregnant  breast-feeding How should I use this medicine? This drug is given as an infusion into a vein. It is administered in a hospital or clinic by a specially trained health care   professional. Talk to your pediatrician regarding the use of this medicine in children. Special care may be needed. Overdosage: If you think you have taken too much of this medicine contact a poison control center or emergency room at once. NOTE: This medicine is only for you. Do not share this medicine with others. What if I miss a dose? It is important not to miss your dose. Call your doctor or health care professional if you are unable to keep an appointment. What may interact with this medicine? This medicine may interact with the following  medications:  antiviral medicines for hepatitis, HIV or AIDS  certain antibiotics like erythromycin and clarithromycin  certain medicines for fungal infections like ketoconazole and itraconazole  certain medicines for seizures like carbamazepine, phenobarbital, phenytoin  gemfibrozil  nefazodone  rifampin  St. John's wort This list may not describe all possible interactions. Give your health care provider a list of all the medicines, herbs, non-prescription drugs, or dietary supplements you use. Also tell them if you smoke, drink alcohol, or use illegal drugs. Some items may interact with your medicine. What should I watch for while using this medicine? Your condition will be monitored carefully while you are receiving this medicine. You will need important blood work done while you are taking this medicine. This medicine can cause serious allergic reactions. If you experience allergic reactions like skin rash, itching or hives, swelling of the face, lips, or tongue, tell your doctor or health care professional right away. In some cases, you may be given additional medicines to help with side effects. Follow all directions for their use. This drug may make you feel generally unwell. This is not uncommon, as chemotherapy can affect healthy cells as well as cancer cells. Report any side effects. Continue your course of treatment even though you feel ill unless your doctor tells you to stop. Call your doctor or health care professional for advice if you get a fever, chills or sore throat, or other symptoms of a cold or flu. Do not treat yourself. This drug decreases your body's ability to fight infections. Try to avoid being around people who are sick. This medicine may increase your risk to bruise or bleed. Call your doctor or health care professional if you notice any unusual bleeding. Be careful brushing and flossing your teeth or using a toothpick because you may get an infection or bleed  more easily. If you have any dental work done, tell your dentist you are receiving this medicine. Avoid taking products that contain aspirin, acetaminophen, ibuprofen, naproxen, or ketoprofen unless instructed by your doctor. These medicines may hide a fever. Do not become pregnant while taking this medicine or for 6 months after stopping it. Women should inform their doctor if they wish to become pregnant or think they might be pregnant. Men should not father a child while taking this medicine or for 3 months after stopping it. There is a potential for serious side effects to an unborn child. Talk to your health care professional or pharmacist for more information. Do not breast-feed an infant while taking this medicine or for 2 weeks after stopping it. This medicine may interfere with the ability to get pregnant or to father a child. You should talk to your doctor or health care professional if you are concerned about your fertility. What side effects may I notice from receiving this medicine? Side effects that you should report to your doctor or health care professional as soon as possible:  allergic reactions   like skin rash, itching or hives, swelling of the face, lips, or tongue  breathing problems  changes in vision  fast, irregular heartbeat  low blood pressure  mouth sores  pain, tingling, numbness in the hands or feet  signs of decreased platelets or bleeding - bruising, pinpoint red spots on the skin, black, tarry stools, blood in the urine  signs of decreased red blood cells - unusually weak or tired, feeling faint or lightheaded, falls  signs of infection - fever or chills, cough, sore throat, pain or difficulty passing urine  signs and symptoms of liver injury like dark yellow or brown urine; general ill feeling or flu-like symptoms; light-colored stools; loss of appetite; nausea; right upper belly pain; unusually weak or tired; yellowing of the eyes or skin  swelling of  the ankles, feet, hands  unusually slow heartbeat Side effects that usually do not require medical attention (report to your doctor or health care professional if they continue or are bothersome):  diarrhea  hair loss  loss of appetite  nausea, vomiting  tiredness This list may not describe all possible side effects. Call your doctor for medical advice about side effects. You may report side effects to FDA at 1-800-FDA-1088. Where should I keep my medicine? This drug is given in a hospital or clinic and will not be stored at home. NOTE: This sheet is a summary. It may not cover all possible information. If you have questions about this medicine, talk to your doctor, pharmacist, or health care provider.  2020 Elsevier/Gold Standard (2017-04-30 13:03:45)  

## 2020-03-28 ENCOUNTER — Encounter (HOSPITAL_COMMUNITY): Payer: Self-pay

## 2020-03-28 ENCOUNTER — Inpatient Hospital Stay (HOSPITAL_COMMUNITY)
Admission: EM | Admit: 2020-03-28 | Discharge: 2020-04-07 | DRG: 947 | Disposition: A | Discharge status: Hospice | Payer: BC Managed Care – PPO | Attending: Internal Medicine | Admitting: Internal Medicine

## 2020-03-28 ENCOUNTER — Emergency Department (HOSPITAL_COMMUNITY): Payer: BC Managed Care – PPO

## 2020-03-28 ENCOUNTER — Telehealth: Payer: Self-pay | Admitting: Nurse Practitioner

## 2020-03-28 DIAGNOSIS — D649 Anemia, unspecified: Secondary | ICD-10-CM | POA: Diagnosis present

## 2020-03-28 DIAGNOSIS — Z79899 Other long term (current) drug therapy: Secondary | ICD-10-CM

## 2020-03-28 DIAGNOSIS — R000 Tachycardia, unspecified: Secondary | ICD-10-CM | POA: Diagnosis present

## 2020-03-28 DIAGNOSIS — K219 Gastro-esophageal reflux disease without esophagitis: Secondary | ICD-10-CM | POA: Diagnosis present

## 2020-03-28 DIAGNOSIS — E860 Dehydration: Secondary | ICD-10-CM | POA: Diagnosis not present

## 2020-03-28 DIAGNOSIS — B370 Candidal stomatitis: Secondary | ICD-10-CM | POA: Diagnosis not present

## 2020-03-28 DIAGNOSIS — Z515 Encounter for palliative care: Secondary | ICD-10-CM

## 2020-03-28 DIAGNOSIS — R600 Localized edema: Secondary | ICD-10-CM | POA: Diagnosis present

## 2020-03-28 DIAGNOSIS — K8689 Other specified diseases of pancreas: Secondary | ICD-10-CM | POA: Diagnosis present

## 2020-03-28 DIAGNOSIS — F419 Anxiety disorder, unspecified: Secondary | ICD-10-CM | POA: Diagnosis present

## 2020-03-28 DIAGNOSIS — R112 Nausea with vomiting, unspecified: Secondary | ICD-10-CM | POA: Diagnosis present

## 2020-03-28 DIAGNOSIS — R188 Other ascites: Secondary | ICD-10-CM | POA: Diagnosis present

## 2020-03-28 DIAGNOSIS — N400 Enlarged prostate without lower urinary tract symptoms: Secondary | ICD-10-CM | POA: Diagnosis present

## 2020-03-28 DIAGNOSIS — Z800 Family history of malignant neoplasm of digestive organs: Secondary | ICD-10-CM

## 2020-03-28 DIAGNOSIS — G893 Neoplasm related pain (acute) (chronic): Secondary | ICD-10-CM | POA: Diagnosis not present

## 2020-03-28 DIAGNOSIS — Z20822 Contact with and (suspected) exposure to covid-19: Secondary | ICD-10-CM | POA: Diagnosis present

## 2020-03-28 DIAGNOSIS — E43 Unspecified severe protein-calorie malnutrition: Secondary | ICD-10-CM | POA: Diagnosis present

## 2020-03-28 DIAGNOSIS — Z87891 Personal history of nicotine dependence: Secondary | ICD-10-CM

## 2020-03-28 DIAGNOSIS — C250 Malignant neoplasm of head of pancreas: Secondary | ICD-10-CM | POA: Diagnosis present

## 2020-03-28 DIAGNOSIS — Z79891 Long term (current) use of opiate analgesic: Secondary | ICD-10-CM

## 2020-03-28 DIAGNOSIS — R64 Cachexia: Secondary | ICD-10-CM | POA: Diagnosis present

## 2020-03-28 DIAGNOSIS — K5903 Drug induced constipation: Secondary | ICD-10-CM | POA: Diagnosis present

## 2020-03-28 DIAGNOSIS — Z681 Body mass index (BMI) 19 or less, adult: Secondary | ICD-10-CM

## 2020-03-28 DIAGNOSIS — E876 Hypokalemia: Secondary | ICD-10-CM | POA: Clinically undetermined

## 2020-03-28 DIAGNOSIS — D638 Anemia in other chronic diseases classified elsewhere: Secondary | ICD-10-CM | POA: Diagnosis present

## 2020-03-28 DIAGNOSIS — R627 Adult failure to thrive: Secondary | ICD-10-CM | POA: Diagnosis present

## 2020-03-28 DIAGNOSIS — K567 Ileus, unspecified: Secondary | ICD-10-CM

## 2020-03-28 DIAGNOSIS — T40605A Adverse effect of unspecified narcotics, initial encounter: Secondary | ICD-10-CM | POA: Diagnosis present

## 2020-03-28 DIAGNOSIS — C787 Secondary malignant neoplasm of liver and intrahepatic bile duct: Secondary | ICD-10-CM | POA: Diagnosis present

## 2020-03-28 DIAGNOSIS — R54 Age-related physical debility: Secondary | ICD-10-CM | POA: Diagnosis present

## 2020-03-28 DIAGNOSIS — R109 Unspecified abdominal pain: Secondary | ICD-10-CM | POA: Diagnosis present

## 2020-03-28 DIAGNOSIS — D72829 Elevated white blood cell count, unspecified: Secondary | ICD-10-CM | POA: Diagnosis present

## 2020-03-28 LAB — COMPREHENSIVE METABOLIC PANEL
ALT: 43 U/L (ref 0–44)
AST: 67 U/L — ABNORMAL HIGH (ref 15–41)
Albumin: 2.2 g/dL — ABNORMAL LOW (ref 3.5–5.0)
Alkaline Phosphatase: 264 U/L — ABNORMAL HIGH (ref 38–126)
Anion gap: 11 (ref 5–15)
BUN: 21 mg/dL — ABNORMAL HIGH (ref 6–20)
CO2: 31 mmol/L (ref 22–32)
Calcium: 8.1 mg/dL — ABNORMAL LOW (ref 8.9–10.3)
Chloride: 95 mmol/L — ABNORMAL LOW (ref 98–111)
Creatinine, Ser: 0.41 mg/dL — ABNORMAL LOW (ref 0.61–1.24)
GFR calc Af Amer: >60 mL/min (ref >60)
GFR calc non Af Amer: >60 mL/min (ref >60)
Glucose, Bld: 156 mg/dL — ABNORMAL HIGH (ref 70–99)
Potassium: 3.8 mmol/L (ref 3.5–5.1)
Sodium: 137 mmol/L (ref 135–145)
Total Bilirubin: 1.3 mg/dL — ABNORMAL HIGH (ref 0.3–1.2)
Total Protein: 6.4 g/dL — ABNORMAL LOW (ref 6.5–8.1)

## 2020-03-28 LAB — URIC ACID: Uric Acid, Serum: 2.7 mg/dL — ABNORMAL LOW (ref 3.7–8.6)

## 2020-03-28 LAB — CBC WITH DIFFERENTIAL/PLATELET
Abs Immature Granulocytes: 1.64 10*3/uL — ABNORMAL HIGH (ref 0.00–0.07)
Basophils Absolute: 0.1 10*3/uL (ref 0.0–0.1)
Basophils Relative: 0 %
Eosinophils Absolute: 0 10*3/uL (ref 0.0–0.5)
Eosinophils Relative: 0 %
HCT: 26.6 % — ABNORMAL LOW (ref 39.0–52.0)
Hemoglobin: 9.7 g/dL — ABNORMAL LOW (ref 13.0–17.0)
Immature Granulocytes: 4 %
Lymphocytes Relative: 1 %
Lymphs Abs: 0.3 10*3/uL — ABNORMAL LOW (ref 0.7–4.0)
MCH: 27.2 pg (ref 26.0–34.0)
MCHC: 36.5 g/dL — ABNORMAL HIGH (ref 30.0–36.0)
MCV: 74.5 fL — ABNORMAL LOW (ref 80.0–100.0)
Monocytes Absolute: 0.1 10*3/uL (ref 0.1–1.0)
Monocytes Relative: 0 %
Neutro Abs: 41.4 10*3/uL — ABNORMAL HIGH (ref 1.7–7.7)
Neutrophils Relative %: 95 %
Platelets: 608 10*3/uL — ABNORMAL HIGH (ref 150–400)
RBC: 3.57 MIL/uL — ABNORMAL LOW (ref 4.22–5.81)
RDW: 13.2 % (ref 11.5–15.5)
WBC: 43.5 10*3/uL — ABNORMAL HIGH (ref 4.0–10.5)
nRBC: 0 % (ref 0.0–0.2)

## 2020-03-28 LAB — PHOSPHORUS: Phosphorus: 2.2 mg/dL — ABNORMAL LOW (ref 2.5–4.6)

## 2020-03-28 LAB — MAGNESIUM: Magnesium: 2.3 mg/dL (ref 1.7–2.4)

## 2020-03-28 LAB — SARS CORONAVIRUS 2 BY RT PCR (HOSPITAL ORDER, PERFORMED IN ~~LOC~~ HOSPITAL LAB): SARS Coronavirus 2: NEGATIVE

## 2020-03-28 MED ORDER — ONDANSETRON 8 MG PO TBDP
8.0000 mg | ORAL_TABLET | Freq: Once | ORAL | Status: AC
  Administered 2020-03-28: 8 mg via ORAL
  Filled 2020-03-28: qty 1

## 2020-03-28 MED ORDER — ONDANSETRON HCL 4 MG/2ML IJ SOLN
4.0000 mg | Freq: Once | INTRAMUSCULAR | Status: AC
  Administered 2020-03-28: 4 mg via INTRAVENOUS
  Filled 2020-03-28: qty 2

## 2020-03-28 MED ORDER — FENTANYL CITRATE (PF) 100 MCG/2ML IJ SOLN
100.0000 ug | Freq: Once | INTRAMUSCULAR | Status: AC
  Administered 2020-03-28: 100 ug via INTRAVENOUS
  Filled 2020-03-28: qty 2

## 2020-03-28 MED ORDER — LORAZEPAM 2 MG/ML IJ SOLN
1.0000 mg | Freq: Once | INTRAMUSCULAR | Status: AC
  Administered 2020-03-28: 1 mg via INTRAVENOUS
  Filled 2020-03-28: qty 1

## 2020-03-28 MED ORDER — IOHEXOL 300 MG/ML  SOLN
100.0000 mL | Freq: Once | INTRAMUSCULAR | Status: AC | PRN
  Administered 2020-03-28: 100 mL via INTRAVENOUS

## 2020-03-28 MED ORDER — SODIUM CHLORIDE 0.9 % IV BOLUS
1000.0000 mL | Freq: Once | INTRAVENOUS | Status: AC
  Administered 2020-03-28: 1000 mL via INTRAVENOUS

## 2020-03-28 MED ORDER — LACTATED RINGERS IV BOLUS
1000.0000 mL | Freq: Once | INTRAVENOUS | Status: AC
  Administered 2020-03-28: 1000 mL via INTRAVENOUS

## 2020-03-28 MED ORDER — FENTANYL CITRATE (PF) 100 MCG/2ML IJ SOLN
50.0000 ug | Freq: Once | INTRAMUSCULAR | Status: AC
  Administered 2020-03-28: 50 ug via INTRAVENOUS
  Filled 2020-03-28: qty 2

## 2020-03-28 NOTE — Telephone Encounter (Signed)
Scheduled per 7/16 los. Called and spoke with wife, confirmed appts and will also check mychart

## 2020-03-28 NOTE — ED Notes (Signed)
ED Provider at bedside. 

## 2020-03-28 NOTE — ED Notes (Signed)
Patient transported to CT 

## 2020-03-28 NOTE — ED Notes (Signed)
Pt provided with requested cranberry juice for PO challenge

## 2020-03-28 NOTE — ED Provider Notes (Signed)
5:05 PM-checkout from Dr. Kathrynn Humble to evaluate patient after period of observation, to determine ability for discharge.  He presents for evaluation of anorexia following first chemotherapy, to treat pancreatic cancer, 3 days ago.  He has ongoing pain from pancreatic cancer and is troubled by constipation secondary to use of narcotic medications.  On arrival he was tachycardic.  Screening labs do not show significant metabolic or renal dysfunction.  Hemoglobin lower than baseline and white count higher, likely results of chemotherapy with granulocyte stimulation treatment.  6:30 PM-patient remains uncomfortable, sitting in his bed, head slouched over, nauseated and complaining of severe abdominal pain.  He continues to be tachycardic.  He feels like his abdomen is swollen.  He has not been able to tolerate food for several days.  Will obtain CT image, prior to disposition plans.  He will require hospitalization for treatment of his current condition.  Clinical Course as of Mar 29 2103  Mon Mar 28, 2020  1751 Low  Uric acid(!) [EW]  1751 Abnormal, white count elevated, hemoglobin low, hematocrit low, platelets elevated  CBC with Differential(!) [EW]  1751 Abnormal, low  Phosphorus(!) [EW]  1751 Normal  Magnesium [EW]  2103 Normal except chloride low, glucose high, BUN high, creatinine normal, calcium low, total protein low, albumin low, AST high, prostate high, total bilirubin high  Comprehensive metabolic panel(!) [EW]  8250 Radiologist interpretation of CT of abdomen pelvis: IMPRESSION: 1. Innumerable heterogeneous ill-defined low-attenuation liver lesions which are increased in size and number when compared to the prior study, consistent with worsening metastatic disease. 2. Interval increase in size of a lobulated heterogeneous low-attenuation mass within the head of the pancreas, consistent with primary pancreatic neoplasm. 3. Moderate severity para-aortic lymphadenopathy, increased  in severity when compared to the prior study. 4. Numerous 2 mm and 3 mm noncalcified lung nodules within the bilateral lung bases. These findings are concerning for metastatic disease. 5. Moderate to marked severity prostate gland enlargement. 6. Stable sclerotic foci within the mid to lower sacrum on the left, posteromedial aspect of the right acetabulum, and proximal right femur. These areas are all stable in size and appearance when compared to the prior study. 7. Moderate to marked amount of free fluid within the abdomen and pelvis which is markedly increased in severity when compared to the prior study. 8. Aortic atherosclerosis.     [EW]    Clinical Course User Index [EW] Daleen Bo, MD   Clinical Course as of Mar 28 2129  Mon Mar 28, 2020  1751 Low  Uric acid(!) [EW]  1751 Abnormal, white count elevated, hemoglobin low, hematocrit low, platelets elevated  CBC with Differential(!) [EW]  1751 Abnormal, low  Phosphorus(!) [EW]  1751 Normal  Magnesium [EW]  2103 Normal except chloride low, glucose high, BUN high, creatinine normal, calcium low, total protein low, albumin low, AST high, prostate high, total bilirubin high  Comprehensive metabolic panel(!) [EW]  5397 Radiologist interpretation of CT of abdomen pelvis: IMPRESSION: 1. Innumerable heterogeneous ill-defined low-attenuation liver lesions which are increased in size and number when compared to the prior study, consistent with worsening metastatic disease. 2. Interval increase in size of a lobulated heterogeneous low-attenuation mass within the head of the pancreas, consistent with primary pancreatic neoplasm. 3. Moderate severity para-aortic lymphadenopathy, increased in severity when compared to the prior study. 4. Numerous 2 mm and 3 mm noncalcified lung nodules within the bilateral lung bases. These findings are concerning for metastatic disease. 5. Moderate to marked severity prostate gland  enlargement. 6. Stable sclerotic foci within the mid to lower sacrum on the left, posteromedial aspect of the right acetabulum, and proximal right femur. These areas are all stable in size and appearance when compared to the prior study. 7. Moderate to marked amount of free fluid within the abdomen and pelvis which is markedly increased in severity when compared to the prior study. 8. Aortic atherosclerosis.     [EW]    Clinical Course User Index [EW] Daleen Bo, MD    MDM-patient with significant progression of pancreatic cancer metastases, with worsening intra abdominal fluid, likely the major source for his increased discomfort and difficulty eating.  He will require hospitalization for stabilization.  No acute respiratory distress, need for advanced level care or further ED interventions, at this time.  9:23 PM-Case discussed with oncology, Dr. Earlie Server.  He will have the patient primary oncologist see the patient tomorrow, after he is admitted by the hospitalist service.  9:23 PM-Consult complete with hospitalist. Patient case explained and discussed.  He agrees to admit patient for further evaluation and treatment. Call ended at 9:32 PM   Daleen Bo, MD 03/28/20 2313
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
## 2020-03-28 NOTE — ED Triage Notes (Signed)
Hx of pancreatic cancer; has not been able to eat or drink in "days." Patient reports have abdominal pain and nausea.

## 2020-03-28 NOTE — ED Notes (Signed)
Provider at bedside

## 2020-03-29 ENCOUNTER — Encounter (HOSPITAL_COMMUNITY): Payer: Self-pay | Admitting: Internal Medicine

## 2020-03-29 ENCOUNTER — Other Ambulatory Visit: Payer: Self-pay

## 2020-03-29 DIAGNOSIS — R112 Nausea with vomiting, unspecified: Secondary | ICD-10-CM | POA: Diagnosis present

## 2020-03-29 DIAGNOSIS — R188 Other ascites: Secondary | ICD-10-CM | POA: Diagnosis present

## 2020-03-29 DIAGNOSIS — T40605A Adverse effect of unspecified narcotics, initial encounter: Secondary | ICD-10-CM | POA: Diagnosis present

## 2020-03-29 DIAGNOSIS — R1013 Epigastric pain: Secondary | ICD-10-CM

## 2020-03-29 DIAGNOSIS — C787 Secondary malignant neoplasm of liver and intrahepatic bile duct: Secondary | ICD-10-CM | POA: Diagnosis present

## 2020-03-29 DIAGNOSIS — R64 Cachexia: Secondary | ICD-10-CM | POA: Diagnosis present

## 2020-03-29 DIAGNOSIS — K8689 Other specified diseases of pancreas: Secondary | ICD-10-CM | POA: Diagnosis not present

## 2020-03-29 DIAGNOSIS — D72829 Elevated white blood cell count, unspecified: Secondary | ICD-10-CM | POA: Diagnosis present

## 2020-03-29 DIAGNOSIS — Z515 Encounter for palliative care: Secondary | ICD-10-CM | POA: Diagnosis not present

## 2020-03-29 DIAGNOSIS — K219 Gastro-esophageal reflux disease without esophagitis: Secondary | ICD-10-CM | POA: Diagnosis present

## 2020-03-29 DIAGNOSIS — E876 Hypokalemia: Secondary | ICD-10-CM | POA: Diagnosis not present

## 2020-03-29 DIAGNOSIS — E860 Dehydration: Secondary | ICD-10-CM | POA: Diagnosis present

## 2020-03-29 DIAGNOSIS — R1084 Generalized abdominal pain: Secondary | ICD-10-CM | POA: Diagnosis not present

## 2020-03-29 DIAGNOSIS — B370 Candidal stomatitis: Secondary | ICD-10-CM | POA: Diagnosis not present

## 2020-03-29 DIAGNOSIS — N400 Enlarged prostate without lower urinary tract symptoms: Secondary | ICD-10-CM | POA: Diagnosis present

## 2020-03-29 DIAGNOSIS — K5903 Drug induced constipation: Secondary | ICD-10-CM | POA: Diagnosis present

## 2020-03-29 DIAGNOSIS — R627 Adult failure to thrive: Secondary | ICD-10-CM | POA: Diagnosis present

## 2020-03-29 DIAGNOSIS — K567 Ileus, unspecified: Secondary | ICD-10-CM | POA: Diagnosis not present

## 2020-03-29 DIAGNOSIS — Z20822 Contact with and (suspected) exposure to covid-19: Secondary | ICD-10-CM | POA: Diagnosis present

## 2020-03-29 DIAGNOSIS — E43 Unspecified severe protein-calorie malnutrition: Secondary | ICD-10-CM | POA: Diagnosis present

## 2020-03-29 DIAGNOSIS — F419 Anxiety disorder, unspecified: Secondary | ICD-10-CM | POA: Diagnosis present

## 2020-03-29 DIAGNOSIS — D649 Anemia, unspecified: Secondary | ICD-10-CM | POA: Diagnosis not present

## 2020-03-29 DIAGNOSIS — G893 Neoplasm related pain (acute) (chronic): Secondary | ICD-10-CM | POA: Diagnosis present

## 2020-03-29 DIAGNOSIS — C250 Malignant neoplasm of head of pancreas: Secondary | ICD-10-CM | POA: Diagnosis present

## 2020-03-29 DIAGNOSIS — R600 Localized edema: Secondary | ICD-10-CM | POA: Diagnosis present

## 2020-03-29 DIAGNOSIS — R109 Unspecified abdominal pain: Secondary | ICD-10-CM | POA: Diagnosis not present

## 2020-03-29 DIAGNOSIS — D638 Anemia in other chronic diseases classified elsewhere: Secondary | ICD-10-CM | POA: Diagnosis present

## 2020-03-29 DIAGNOSIS — Z681 Body mass index (BMI) 19 or less, adult: Secondary | ICD-10-CM | POA: Diagnosis not present

## 2020-03-29 LAB — COMPREHENSIVE METABOLIC PANEL
ALT: 40 U/L (ref 0–44)
AST: 67 U/L — ABNORMAL HIGH (ref 15–41)
Albumin: 2 g/dL — ABNORMAL LOW (ref 3.5–5.0)
Alkaline Phosphatase: 247 U/L — ABNORMAL HIGH (ref 38–126)
Anion gap: 11 (ref 5–15)
BUN: 17 mg/dL (ref 6–20)
CO2: 28 mmol/L (ref 22–32)
Calcium: 7.9 mg/dL — ABNORMAL LOW (ref 8.9–10.3)
Chloride: 99 mmol/L (ref 98–111)
Creatinine, Ser: 0.38 mg/dL — ABNORMAL LOW (ref 0.61–1.24)
GFR calc Af Amer: >60 mL/min (ref >60)
GFR calc non Af Amer: >60 mL/min (ref >60)
Glucose, Bld: 145 mg/dL — ABNORMAL HIGH (ref 70–99)
Potassium: 4.1 mmol/L (ref 3.5–5.1)
Sodium: 138 mmol/L (ref 135–145)
Total Bilirubin: 0.8 mg/dL (ref 0.3–1.2)
Total Protein: 5.8 g/dL — ABNORMAL LOW (ref 6.5–8.1)

## 2020-03-29 LAB — CBC
HCT: 26.1 % — ABNORMAL LOW (ref 39.0–52.0)
Hemoglobin: 9.3 g/dL — ABNORMAL LOW (ref 13.0–17.0)
MCH: 26.7 pg (ref 26.0–34.0)
MCHC: 35.6 g/dL (ref 30.0–36.0)
MCV: 75 fL — ABNORMAL LOW (ref 80.0–100.0)
Platelets: 422 10*3/uL — ABNORMAL HIGH (ref 150–400)
RBC: 3.48 MIL/uL — ABNORMAL LOW (ref 4.22–5.81)
RDW: 13.2 % (ref 11.5–15.5)
WBC: 37.8 10*3/uL — ABNORMAL HIGH (ref 4.0–10.5)
nRBC: 0 % (ref 0.0–0.2)

## 2020-03-29 MED ORDER — ACETAMINOPHEN 650 MG RE SUPP: 650.0000 mg | Freq: Four times a day (QID) | RECTAL | Status: DC | PRN

## 2020-03-29 MED ORDER — SENNOSIDES-DOCUSATE SODIUM 8.6-50 MG PO TABS
1.0000 | ORAL_TABLET | Freq: Two times a day (BID) | ORAL | Status: DC
  Administered 2020-03-29 (×3): 1 via ORAL
  Filled 2020-03-29 (×3): qty 1

## 2020-03-29 MED ORDER — BOOST / RESOURCE BREEZE PO LIQD CUSTOM
1.0000 | Freq: Three times a day (TID) | ORAL | Status: DC
  Administered 2020-03-29 – 2020-04-06 (×9): 1 via ORAL

## 2020-03-29 MED ORDER — ADULT MULTIVITAMIN W/MINERALS CH
1.0000 | ORAL_TABLET | Freq: Every day | ORAL | Status: DC
  Administered 2020-03-29 – 2020-04-07 (×10): 1 via ORAL
  Filled 2020-03-29 (×10): qty 1

## 2020-03-29 MED ORDER — ONDANSETRON HCL 4 MG/2ML IJ SOLN
4.0000 mg | Freq: Four times a day (QID) | INTRAMUSCULAR | Status: DC | PRN
  Administered 2020-03-30: 4 mg via INTRAVENOUS
  Filled 2020-03-29 (×2): qty 2

## 2020-03-29 MED ORDER — MORPHINE SULFATE ER 15 MG PO TBCR
15.0000 mg | EXTENDED_RELEASE_TABLET | Freq: Two times a day (BID) | ORAL | Status: DC
  Administered 2020-03-29 – 2020-03-30 (×4): 15 mg via ORAL
  Filled 2020-03-29 (×4): qty 1

## 2020-03-29 MED ORDER — PROSOURCE PLUS PO LIQD
30.0000 mL | Freq: Two times a day (BID) | ORAL | Status: DC
  Administered 2020-03-29: 30 mL via ORAL
  Filled 2020-03-29 (×16): qty 30

## 2020-03-29 MED ORDER — LORAZEPAM 2 MG/ML IJ SOLN
0.5000 mg | Freq: Once | INTRAMUSCULAR | Status: AC
  Administered 2020-03-29: 0.5 mg via INTRAVENOUS
  Filled 2020-03-29: qty 1

## 2020-03-29 MED ORDER — FENTANYL CITRATE (PF) 100 MCG/2ML IJ SOLN
12.5000 ug | INTRAMUSCULAR | Status: DC | PRN
  Administered 2020-03-29 – 2020-03-30 (×11): 50 ug via INTRAVENOUS
  Filled 2020-03-29 (×11): qty 2

## 2020-03-29 MED ORDER — SODIUM CHLORIDE 0.9 % IV BOLUS
1000.0000 mL | Freq: Once | INTRAVENOUS | Status: AC
  Administered 2020-03-29: 1000 mL via INTRAVENOUS

## 2020-03-29 MED ORDER — PANCRELIPASE (LIP-PROT-AMYL) 12000-38000 UNITS PO CPEP
36000.0000 [IU] | ORAL_CAPSULE | Freq: Three times a day (TID) | ORAL | Status: DC
  Administered 2020-03-29 – 2020-04-07 (×22): 36000 [IU] via ORAL
  Filled 2020-03-29 (×6): qty 3
  Filled 2020-03-29: qty 1
  Filled 2020-03-29 (×3): qty 3
  Filled 2020-03-29: qty 1
  Filled 2020-03-29 (×11): qty 3
  Filled 2020-03-29: qty 1
  Filled 2020-03-29: qty 3

## 2020-03-29 MED ORDER — ACETAMINOPHEN 325 MG PO TABS
650.0000 mg | ORAL_TABLET | Freq: Four times a day (QID) | ORAL | Status: DC | PRN
  Administered 2020-03-29: 650 mg via ORAL
  Filled 2020-03-29: qty 2

## 2020-03-29 MED ORDER — CALCIUM CARBONATE ANTACID 500 MG PO CHEW
1.0000 | CHEWABLE_TABLET | Freq: Every day | ORAL | Status: DC | PRN
  Administered 2020-03-29: 200 mg via ORAL
  Filled 2020-03-29: qty 1

## 2020-03-29 MED ORDER — BISMUTH SUBSALICYLATE 262 MG/15ML PO SUSP: 30.0000 mL | Freq: Every day | ORAL | Status: DC | PRN

## 2020-03-29 MED ORDER — CHLORHEXIDINE GLUCONATE CLOTH 2 % EX PADS
6.0000 | MEDICATED_PAD | Freq: Every day | CUTANEOUS | Status: DC
  Administered 2020-03-30 – 2020-04-06 (×7): 6 via TOPICAL

## 2020-03-29 MED ORDER — LACTATED RINGERS IV SOLN: INTRAVENOUS | Status: DC

## 2020-03-29 MED ORDER — ONDANSETRON HCL 4 MG PO TABS: 4.0000 mg | ORAL_TABLET | Freq: Four times a day (QID) | ORAL | Status: DC | PRN

## 2020-03-29 MED ORDER — DEXTROSE-NACL 5-0.9 % IV SOLN: INTRAVENOUS | Status: DC

## 2020-03-29 MED ORDER — SIMETHICONE 80 MG PO CHEW
80.0000 mg | CHEWABLE_TABLET | Freq: Four times a day (QID) | ORAL | Status: DC | PRN
  Administered 2020-03-29: 80 mg via ORAL
  Filled 2020-03-29: qty 1

## 2020-03-29 MED ORDER — ENOXAPARIN SODIUM 40 MG/0.4ML ~~LOC~~ SOLN
40.0000 mg | SUBCUTANEOUS | Status: DC
  Administered 2020-03-29 – 2020-04-07 (×10): 40 mg via SUBCUTANEOUS
  Filled 2020-03-29 (×10): qty 0.4

## 2020-03-29 NOTE — Progress Notes (Addendum)
   03/29/20 1842  Assess: MEWS Score  Temp 99.7 F (37.6 C)  BP (!) 152/86  Pulse Rate (!) 131  Resp (!) 25  SpO2 95 %  O2 Device Room Air  Assess: MEWS Score  MEWS Temp 0  MEWS Systolic 0  MEWS Pulse 3  MEWS RR 1  MEWS LOC 0  MEWS Score 4  MEWS Score Color Red  Assess: if the MEWS score is Yellow or Red  Were vital signs taken at a resting state? Yes  Focused Assessment No change from prior assessment  Early Detection of Sepsis Score *See Row Information* Medium  MEWS guidelines implemented *See Row Information* Yes  Treat  MEWS Interventions Other (Comment) (MD order for 1L NS bolus)  Take Vital Signs  Increase Vital Sign Frequency  Red: Q 1hr X 4 then Q 4hr X 4, if remains red, continue Q 4hrs  Escalate  MEWS: Escalate Red: discuss with charge nurse/RN and provider, consider discussing with RRT  Notify: Charge Nurse/RN  Name of Charge Nurse/RN Notified Jayden RN  Date Charge Nurse/RN Notified 03/29/20  Time Charge Nurse/RN Notified 1853  Notify: Provider  Provider Name/Title Bonnell Public RN  Date Provider Notified 03/29/20  Time Provider Notified 1843  Notification Type Call  Notification Reason Change in status  Response See new orders  Date of Provider Response 03/29/20  Time of Provider Response 1847  Notify: Rapid Response  Name of Rapid Response RN Notified  (RR not called per MD; pt HR has been above 120 >24 hours)  Document  Patient Outcome Other (Comment) (Bolus administered; No rapid response per MD)  Progress note created (see row info) Yes   Pt red MEWS due to HR >130.  MD notified and order entered for 1L NS bolus.  RR not called at this time per MD as pt condition has not changed and HR has been a continuous issue throughout this admission.  Will recheck in 30 min to see if HR has decreased post PRN pain meds administration.  Pt alert and resting at this time.  Conveyed info to nightshift RN.  Rechecked HR which remained above 130.  Contacted  Rapid Response RN due to sustained HR above 130.

## 2020-03-29 NOTE — Progress Notes (Signed)
Briefly patient is an unfortunate 59 year old man with metastatic pancreatic cancer who was admitted earlier today with recurrent nausea and vomiting.  He was primarily admitted for dehydration and for pain control while he is unable to take p.o.'s.  He himself says "I do not know which you can do for me doc".  He feels that his pain is presently well controlled.  He does say he feels cold and would like a couple of blankets.  Patient is an extremely cachectic man lying in bed with no covers on his body.  Plan is for ongoing pain management with oral morphine as tolerated and as needed fentanyl. This seems to be a reasonable plan for his pain management per the patient. Patient received 2 L bolus of normal saline in the ED however ongoing IV fluids has not been continued.  I will start patient on LR at 75 cc an hour as patient is on a clear liquid diet.  Diet can be advanced as tolerated.
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
## 2020-03-29 NOTE — ED Notes (Signed)
Pt. Documented in error see above note in chart. 

## 2020-03-29 NOTE — Progress Notes (Signed)
   03/29/20 1550  Assess: MEWS Score  Temp 99.4 F (37.4 C)  BP 127/80  Pulse Rate (!) 128  Resp (!) 24  SpO2 97 %  O2 Device Room Air  Assess: MEWS Score  MEWS Temp 0  MEWS Systolic 0  MEWS Pulse 2  MEWS RR 1  MEWS LOC 0  MEWS Score 3  MEWS Score Color Yellow  Assess: if the MEWS score is Yellow or Red  Were vital signs taken at a resting state? Yes  Focused Assessment No change from prior assessment  Early Detection of Sepsis Score *See Row Information* Medium  MEWS guidelines implemented *See Row Information* Yes  Take Vital Signs  Increase Vital Sign Frequency  Yellow: Q 2hr X 2 then Q 4hr X 2, if remains yellow, continue Q 4hrs  Notify: Charge Nurse/RN  Name of Charge Nurse/RN Notified Jayden RN  Date Charge Nurse/RN Notified 03/29/20  Time Charge Nurse/RN Notified 1550  Document  Patient Outcome Other (Comment) (yellow mews reinitiated due to transfer; no change to condit)   No change in pt condition post transfer.  Reporting RN states that MD is aware of pt condition as he has been yellow MEWS for some time due to elevated HR.  No tele orders at this time.  Pt resting comfortably with no complaints.  Will reinitiate yellow MEWS protocol beginning with q2h x2, followed by q4h X2

## 2020-03-29 NOTE — Progress Notes (Signed)
Initial Nutrition Assessment  RD working remotely.  DOCUMENTATION CODES:   Underweight  INTERVENTION:  - will order Boost Breeze TID, each supplement provides 250 kcal and 9 grams of protein. - will order 30 mL Prosource Plus BID, each supplement provides 100 kcal and 15 grams of protein. - will order 1 tablet multivitamin with minerals/day. - diet advancement as medically feasible. - will complete NFPE at follow-up.  NUTRITION DIAGNOSIS:   Inadequate oral intake related to nausea, poor appetite, vomiting as evidenced by per patient/family report.  GOAL:   Patient will meet greater than or equal to 90% of their needs  MONITOR:   PO intake, Supplement acceptance, Diet advancement, Labs, Weight trends  REASON FOR ASSESSMENT:   Malnutrition Screening Tool    ASSESSMENT:   59 y.o. male with medical history of metastatic pancreatic cancer with associated recurrent N/V, recently started on chemotherapy (a few days PTA). He presented to the ED d/t worsening abdominal pain, N/V, poor/no appetite with poor PO intakes, dizziness, and lightheadedness. In the ED he was noted to be severely dehydrated.  Patient has been on CLD since admission overnight. No intakes documented yet. Patient was assessed over the phone by [place] on 7/12. At that time, patient reported intermittent nausea not controlled by nausea medication. He was having persistent abdominal cramping and pain and creon did not help alleviate this. He reported enjoying fruit and that he was drinking Ensure occasionally as he understood the need for protein intake. Handouts/informational sheets were mailed to patient's home address by [place].  Unable to reach patient by phone at this time. Unable to obtain nutrition-related information outside of what is documented in the chart as it pertains to symptoms PTA.   Suspect some degree of malnutrition but unable to confirm at this time. Weight today is 138 lb and  weight on 6/21 was 147 lb. This indicates 9 lb weight loss (6.1% body weight) in the past 1 month; significant for time frame.    Labs reviewed; creatinine: 0.38 mg/dl, Ca: 7.9 mg/dl, Alk Phos elevated, AST elevated. Medications reviewed; 36000 units creon TID, PRN IV zofran, 1 tablet senokot BID.  IVF; D5-NS @ 125 ml/hr (510 kcal).     NUTRITION - FOCUSED PHYSICAL EXAM:  unable to complete at this time.   Diet Order:   Diet Order            Diet clear liquid Room service appropriate? Yes; Fluid consistency: Thin  Diet effective now                 EDUCATION NEEDS:   Not appropriate for education at this time  Skin:  Skin Assessment: Reviewed RN Assessment  Last BM:  7/19  Height:   Ht Readings from Last 1 Encounters:  03/29/20 '6\' 1"'$  (1.854 m)    Weight:   Wt Readings from Last 1 Encounters:  03/29/20 62.8 kg    Estimated Nutritional Needs:  Kcal:  2000-2200 kcal Protein:  105-115 grams Fluid:  >/= 2.2 L/day     Jarome Matin, MS, RD, LDN, CNSC Inpatient Clinical Dietitian RD pager # available in AMION  After hours/weekend pager # available in [facility]

## 2020-03-29 NOTE — Progress Notes (Signed)
Discussed unchanged status HR with Agricultural consultant. ED MD aware    03/29/20 0536  Vitals  Temp 98.8 F (37.1 C)  Temp Source Oral  BP (!) 143/88  MAP (mmHg) 102  BP Location Left Arm  BP Method Automatic  Patient Position (if appropriate) Lying  Pulse Rate (!) 120  Resp 20  MEWS COLOR  MEWS Score Color Yellow  Oxygen Therapy  SpO2 99 %

## 2020-03-30 ENCOUNTER — Inpatient Hospital Stay (HOSPITAL_COMMUNITY): Payer: BC Managed Care – PPO

## 2020-03-30 DIAGNOSIS — K8689 Other specified diseases of pancreas: Secondary | ICD-10-CM

## 2020-03-30 DIAGNOSIS — R627 Adult failure to thrive: Secondary | ICD-10-CM | POA: Diagnosis present

## 2020-03-30 DIAGNOSIS — E860 Dehydration: Secondary | ICD-10-CM

## 2020-03-30 DIAGNOSIS — D649 Anemia, unspecified: Secondary | ICD-10-CM | POA: Diagnosis not present

## 2020-03-30 DIAGNOSIS — R112 Nausea with vomiting, unspecified: Secondary | ICD-10-CM

## 2020-03-30 DIAGNOSIS — E876 Hypokalemia: Secondary | ICD-10-CM | POA: Clinically undetermined

## 2020-03-30 DIAGNOSIS — D72829 Elevated white blood cell count, unspecified: Secondary | ICD-10-CM

## 2020-03-30 DIAGNOSIS — E43 Unspecified severe protein-calorie malnutrition: Secondary | ICD-10-CM | POA: Diagnosis present

## 2020-03-30 DIAGNOSIS — R1013 Epigastric pain: Secondary | ICD-10-CM | POA: Diagnosis not present

## 2020-03-30 DIAGNOSIS — C250 Malignant neoplasm of head of pancreas: Secondary | ICD-10-CM

## 2020-03-30 LAB — CBC
HCT: 25.1 % — ABNORMAL LOW (ref 39.0–52.0)
Hemoglobin: 9.1 g/dL — ABNORMAL LOW (ref 13.0–17.0)
MCH: 27.1 pg (ref 26.0–34.0)
MCHC: 36.3 g/dL — ABNORMAL HIGH (ref 30.0–36.0)
MCV: 74.7 fL — ABNORMAL LOW (ref 80.0–100.0)
Platelets: 332 10*3/uL (ref 150–400)
RBC: 3.36 MIL/uL — ABNORMAL LOW (ref 4.22–5.81)
RDW: 13.5 % (ref 11.5–15.5)
WBC: 19.7 10*3/uL — ABNORMAL HIGH (ref 4.0–10.5)
nRBC: 0.2 % (ref 0.0–0.2)

## 2020-03-30 LAB — BASIC METABOLIC PANEL
Anion gap: 8 (ref 5–15)
BUN: 11 mg/dL (ref 6–20)
CO2: 24 mmol/L (ref 22–32)
Calcium: 7.1 mg/dL — ABNORMAL LOW (ref 8.9–10.3)
Chloride: 102 mmol/L (ref 98–111)
Creatinine, Ser: 0.37 mg/dL — ABNORMAL LOW (ref 0.61–1.24)
GFR calc Af Amer: >60 mL/min (ref >60)
GFR calc non Af Amer: >60 mL/min (ref >60)
Glucose, Bld: 141 mg/dL — ABNORMAL HIGH (ref 70–99)
Potassium: 3.4 mmol/L — ABNORMAL LOW (ref 3.5–5.1)
Sodium: 134 mmol/L — ABNORMAL LOW (ref 135–145)

## 2020-03-30 LAB — URINALYSIS, ROUTINE W REFLEX MICROSCOPIC
Bilirubin Urine: NEGATIVE
Glucose, UA: NEGATIVE mg/dL
Hgb urine dipstick: NEGATIVE
Ketones, ur: NEGATIVE mg/dL
Leukocytes,Ua: NEGATIVE
Nitrite: NEGATIVE
Protein, ur: NEGATIVE mg/dL
Specific Gravity, Urine: 1.023 (ref 1.005–1.030)
pH: 6 (ref 5.0–8.0)

## 2020-03-30 LAB — MAGNESIUM: Magnesium: 2.2 mg/dL (ref 1.7–2.4)

## 2020-03-30 MED ORDER — METOPROLOL TARTRATE 5 MG/5ML IV SOLN
5.0000 mg | INTRAVENOUS | Status: DC | PRN
  Administered 2020-03-30: 5 mg via INTRAVENOUS
  Filled 2020-03-30: qty 5

## 2020-03-30 MED ORDER — POLYETHYLENE GLYCOL 3350 17 G PO PACK: 17.0000 g | PACK | Freq: Every day | ORAL | Status: DC

## 2020-03-30 MED ORDER — MORPHINE SULFATE (PF) 4 MG/ML IV SOLN: 3.0000 mg | INTRAVENOUS | Status: DC | PRN

## 2020-03-30 MED ORDER — HYDROMORPHONE HCL 1 MG/ML IJ SOLN
1.0000 mg | INTRAMUSCULAR | Status: DC | PRN
  Administered 2020-03-30 – 2020-03-31 (×6): 1 mg via INTRAVENOUS
  Filled 2020-03-30 (×6): qty 1

## 2020-03-30 MED ORDER — MORPHINE SULFATE (PF) 2 MG/ML IV SOLN
2.0000 mg | INTRAVENOUS | Status: DC | PRN
  Administered 2020-03-30: 2 mg via INTRAVENOUS
  Filled 2020-03-30: qty 1

## 2020-03-30 MED ORDER — OXYCODONE HCL 5 MG PO TABS
10.0000 mg | ORAL_TABLET | ORAL | Status: DC | PRN
  Administered 2020-03-30 (×2): 10 mg via ORAL
  Filled 2020-03-30 (×2): qty 2

## 2020-03-30 MED ORDER — MORPHINE SULFATE (PF) 4 MG/ML IV SOLN
3.0000 mg | INTRAVENOUS | Status: DC | PRN
  Administered 2020-03-30: 3 mg via INTRAVENOUS
  Filled 2020-03-30: qty 1

## 2020-03-30 MED ORDER — POTASSIUM CHLORIDE CRYS ER 20 MEQ PO TBCR
40.0000 meq | EXTENDED_RELEASE_TABLET | Freq: Once | ORAL | Status: AC
  Administered 2020-03-30: 40 meq via ORAL
  Filled 2020-03-30: qty 2

## 2020-03-30 MED ORDER — HYDROMORPHONE HCL 1 MG/ML IJ SOLN: 1.0000 mg | INTRAMUSCULAR | Status: DC | PRN

## 2020-03-30 MED ORDER — POTASSIUM CHLORIDE 10 MEQ/100ML IV SOLN
10.0000 meq | INTRAVENOUS | Status: AC
  Administered 2020-03-30 (×2): 10 meq via INTRAVENOUS
  Filled 2020-03-30 (×2): qty 100

## 2020-03-30 MED ORDER — ALBUMIN HUMAN 25 % IV SOLN
25.0000 g | Freq: Once | INTRAVENOUS | Status: AC
  Administered 2020-03-30: 25 g via INTRAVENOUS
  Filled 2020-03-30: qty 100

## 2020-03-30 MED ORDER — SENNOSIDES-DOCUSATE SODIUM 8.6-50 MG PO TABS
1.0000 | ORAL_TABLET | Freq: Every day | ORAL | Status: DC
  Administered 2020-03-31 – 2020-04-06 (×4): 1 via ORAL
  Filled 2020-03-30 (×5): qty 1

## 2020-03-30 MED ORDER — SIMETHICONE 80 MG PO CHEW
160.0000 mg | CHEWABLE_TABLET | Freq: Four times a day (QID) | ORAL | Status: DC
  Administered 2020-03-30 – 2020-04-07 (×18): 160 mg via ORAL
  Filled 2020-03-30 (×22): qty 2

## 2020-03-30 MED ORDER — METOPROLOL TARTRATE 5 MG/5ML IV SOLN
5.0000 mg | Freq: Once | INTRAVENOUS | Status: AC
  Administered 2020-03-30: 5 mg via INTRAVENOUS
  Filled 2020-03-30: qty 5

## 2020-03-30 MED ORDER — MORPHINE SULFATE ER 30 MG PO TBCR
30.0000 mg | EXTENDED_RELEASE_TABLET | Freq: Two times a day (BID) | ORAL | Status: DC
  Administered 2020-03-30 – 2020-04-03 (×8): 30 mg via ORAL
  Filled 2020-03-30 (×8): qty 1

## 2020-03-30 NOTE — Progress Notes (Signed)
   03/30/20 1114  Assess: MEWS Score  Level of Consciousness Alert  Assess: MEWS Score  MEWS Temp 0  MEWS Systolic 0  MEWS Pulse 3  MEWS RR 1  MEWS LOC 0  MEWS Score 4  MEWS Score Color Red  Assess: if the MEWS score is Yellow or Red  Were vital signs taken at a resting state? Yes  Focused Assessment No change from prior assessment  Early Detection of Sepsis Score *See Row Information* Low  MEWS guidelines implemented *See Row Information* Yes  Treat  MEWS Interventions Escalated (See documentation below)  Pain Scale 0-10  Pain Score 10  Take Vital Signs  Increase Vital Sign Frequency  Red: Q 1hr X 4 then Q 4hr X 4, if remains red, continue Q 4hrs  Escalate  MEWS: Escalate Red: discuss with charge nurse/RN and provider, consider discussing with RRT  Notify: Charge Nurse/RN  Name of Charge Nurse/RN Notified Kaitlyn RN   Date Charge Nurse/RN Notified 03/30/20  Time Charge Nurse/RN Notified 1117  Notify: Provider  Provider Name/Title Dr. Grandville Silos   Date Provider Notified 03/30/20  Time Provider Notified 1117  Notification Type Face-to-face  Notification Reason Change in status  Response See new orders  Date of Provider Response 03/30/20  Time of Provider Response 1117    Temp: 99.1, BP: 148/97, P-143, Resp- 23, O2:96% on Room air. Dr. Grandville Silos on floor and saw patient at bedside. EKG and metoprolol 5mg  IV given. Will follow red muse protocol.

## 2020-03-30 NOTE — Progress Notes (Signed)
IP PROGRESS NOTE  Subjective:   Joe [name] is known to me with a recent diagnosis of metastatic pancreas cancer.  He completed cycle 1 gemcitabine/Abraxane chemotherapy on 03/25/2020.  He presented to the emergency room on 03/28/2020 with increased abdominal pain, nausea, and anorexia.  He was dehydrated.  He was admitted for further evaluation.  A CT of the abdomen pelvis on 03/28/2020 revealed small nodules at the lung bases, increased size and number of liver metastases compared to the 02/27/2020 CT.  The pancreas mass has increased in size.  Increased periaortic adenopathy.  The prostate is enlarged.  Increased ascites.  Stable sclerotic bone lesions.  He complains of persistent abdomen and back pain.  He reports having diarrhea and nausea.  No emesis.  Pain medication provides short-term pain relief. Objective: Vital signs in last 24 hours: Blood pressure 139/85, pulse (!) 120, temperature 98.3 F (36.8 C), temperature source Oral, resp. rate (!) 24, height 6\' 1"  (1.854 m), weight 138 lb 7.2 oz (62.8 kg), SpO2 97 %.  Intake/Output from previous day: 07/20 0701 - 07/21 0700 In: 2718.8 [I.V.:1718.8; IV Piggyback:1000] Out: 825 [Urine:825]  Physical Exam:  HEENT: No thrush Lungs: Clear bilaterally, no respiratory distress Cardiac: Regular rate and rhythm, tachycardia Abdomen: Mildly distended, no mass, tender in the mid upper abdomen Extremities: No leg edema   Portacath/PICC-without erythema  Lab Results: Recent Labs    03/29/20 0415 03/30/20 0545  WBC 37.8* 19.7*  HGB 9.3* 9.1*  HCT 26.1* 25.1*  PLT 422* 332    BMET Recent Labs    03/29/20 0415 03/30/20 0545  NA 138 134*  K 4.1 3.4*  CL 99 102  CO2 28 24  GLUCOSE 145* 141*  BUN 17 11  CREATININE 0.38* 0.37*  CALCIUM 7.9* 7.1*    No results found for: CEA1  Studies/Results: DG Chest 2 View  Result Date: 03/30/2020 CLINICAL DATA:  Metastatic pancreatic cancer. EXAM: CHEST - 2 VIEW COMPARISON:  None.  FINDINGS: Low volume film with right base atelectasis or infiltrate in small right pleural effusion. Right Port-A-Cath tip overlies the right atrium. The visualized bony structures of the thorax show now acute abnormality. IMPRESSION: Low volume film with right base atelectasis or infiltrate and small right pleural effusion. Electronically Signed   By: Misty Stanley M.D.   On: 03/30/2020 11:59   CT Abdomen Pelvis W Contrast  Result Date: 03/28/2020 CLINICAL DATA:  Suspected bowel obstruction. EXAM: CT ABDOMEN AND PELVIS WITH CONTRAST TECHNIQUE: Multidetector CT imaging of the abdomen and pelvis was performed using the standard protocol following bolus administration of intravenous contrast. CONTRAST:  139mL OMNIPAQUE IOHEXOL 300 MG/ML  SOLN COMPARISON:  February 27, 2020 FINDINGS: Lower chest: Numerous 2 mm and 3 mm noncalcified lung nodules are seen within the bilateral lung bases. Hepatobiliary: Innumerable heterogeneous ill-defined low-attenuation liver lesions are seen scattered throughout the right and left lobes. These are increased in size and number when compared to the prior study. No gallstones, gallbladder wall thickening, or biliary dilatation. Pancreas: A 3.9 cm x 3.7 cm lobulated heterogeneous low-attenuation mass is seen within the head of the pancreas. This is increased in size when compared to the prior exam. Spleen: Normal in size without focal abnormality. Adrenals/Urinary Tract: Adrenal glands are unremarkable. Kidneys are normal, without renal calculi or hydronephrosis. A 1.6 cm cyst is seen within the lateral aspect of the mid right kidney. Bladder is unremarkable. Stomach/Bowel: Stomach is within normal limits. The appendix is not clearly identified. No evidence of bowel  wall thickening, distention, or inflammatory changes. Vascular/Lymphatic: There is mild calcification of the abdominal aorta. Moderate severity para-aortic lymphadenopathy is seen which is increased in severity when compared  to the prior study. Reproductive: There is moderate to marked severity prostate gland enlargement. Other: No abdominal wall hernia or abnormality. A moderate to marked amount of free fluid is seen within the abdomen and pelvis. This is markedly increased in severity when compared to the prior study. Musculoskeletal: A 0.9 cm sclerotic focus is seen within the mid to lower sacrum on the left. A 0.4 cm sclerotic focus is noted within the posteromedial aspect of the right acetabulum. An additional 0.8 cm sclerotic focus is seen within the proximal right femur. These areas are all stable in size and appearance when compared to the prior study. IMPRESSION: 1. Innumerable heterogeneous ill-defined low-attenuation liver lesions which are increased in size and number when compared to the prior study, consistent with worsening metastatic disease. 2. Interval increase in size of a lobulated heterogeneous low-attenuation mass within the head of the pancreas, consistent with primary pancreatic neoplasm. 3. Moderate severity para-aortic lymphadenopathy, increased in severity when compared to the prior study. 4. Numerous 2 mm and 3 mm noncalcified lung nodules within the bilateral lung bases. These findings are concerning for metastatic disease. 5. Moderate to marked severity prostate gland enlargement. 6. Stable sclerotic foci within the mid to lower sacrum on the left, posteromedial aspect of the right acetabulum, and proximal right femur. These areas are all stable in size and appearance when compared to the prior study. 7. Moderate to marked amount of free fluid within the abdomen and pelvis which is markedly increased in severity when compared to the prior study. 8. Aortic atherosclerosis. Aortic Atherosclerosis (ICD10-I70.0). Electronically Signed   By: Virgina Norfolk M.D.   On: 03/28/2020 20:24   DG Abd 2 Views  Result Date: 03/30/2020 CLINICAL DATA:  Metastatic pancreatic cancer with recurrent nausea and vomiting.  EXAM: ABDOMEN - 2 VIEW COMPARISON:  CT scan 03/28/2020 FINDINGS: Two views study shows no intraperitoneal free air. Gaseous distention of small bowel and colon is evident. Centralization of bowel loops suggests ascites. No worrisome lytic or sclerotic osseous abnormality. IMPRESSION: Gaseous distention of small bowel and colon. Imaging features suggest ileus. No intraperitoneal free air Electronically Signed   By: Misty Stanley M.D.   On: 03/30/2020 11:58   CT images reviewed Medications: I have reviewed the patient's current medications.  Assessment/Plan: 1. Pancreas cancer-stage IV ? CT abdomen/pelvis 01/22/2020-multiple hypoattenuating/hypoenhancing liver lesions-indeterminate ? CT abdomen/pelvis 02/27/2020-ill-defined pancreas head mass, new and enlarging extensive hepatic metastases, retroperitoneal adenopathy ? MRI abdomen 02/28/2020-mass at the junction and head of the pancreas body, no vascular invasion, multiple hepatic metastases, retroperitoneal adenopathy ? Ultrasound-guided biopsy of a right liver lesion 03/01/2020-poorly differentiated carcinoma with necrosis, cytokeratin 7, cytokeratin 20, and GATA-3 positive ? Cycle 1 gemcitabine/Abraxane 03/25/2020 ? CT abdomen/pelvis 03/28/2020-increased pancreas mass, increased size and number of liver metastases, progressive ascites, stable sclerotic bone lesions at the sacrum, right acetabulum, and proximal right femur, increased periaortic lymphadenopathy 2. Pain secondary to #1 3. Family history of cancer-father with adrenal cancer 4. Constipation-likely secondary to narcotic analgesics 5. Hypercalcemia 03/15/2020. Zometa 03/16/2020.  6. Admission 03/29/2020 with abdominal pain, nausea, and dehydration   Joe [name] has been diagnosed with metastatic pancreas cancer.  He completed 1 cycle of gemcitabine/Abraxane on 03/25/2020.  He was admitted yesterday with increased abdominal pain, nausea, and dehydration.  I doubt his symptoms are related to  chemotherapy.  It is unusual to have nausea from the gemcitabine/Abraxane regimen.  I suspect the pain and nausea are related to the primary pancreas mass and metastatic disease.  The tachycardia is likely related to pain.  He is now at day 6 following cycle 1 gemcitabine/Abraxane.  It is too early to expect a response to chemotherapy.  We may need to consider hospice care if his performance status does not improve with better pain control.  Recommendations: 1.  Adjust narcotic pain regimen, I will increase the MS Contin 2.  Continue intravenous hydration      LOS: 1 day   Betsy Coder, MD   03/30/2020, 2:22 PM

## 2020-03-30 NOTE — Progress Notes (Signed)
PROGRESS NOTE    Joe [name]  IOX:[id number] DOB: [birth year] DOA: 03/28/2020 PCP: Patient, No Pcp Per    Chief Complaint  Patient presents with  . Dehydration    Brief Narrative:  Patient is an unfortunate 59 year old gentleman history of recently diagnosed metastatic prostate cancer status post completion of cycle 1 gemcitabine/Abraxane chemotherapy on 03/25/2020 presented to the ED with worsening abdominal pain, nausea, poor oral intake, noted to be dehydrated.  CT abdomen and pelvis done showed nodules in the lung bases, increased size and number of liver metastases compared to 02/27/2020 CT, increased size of pancreatic mass, increased periaortic adenopathy, prostate enlarged, increased ascites, stable sclerotic bone lesions noted.  Patient placed back on MS Contin twice daily and fentanyl initially.  Oncology consulted and informed of patient's admission.  Pain medications being adjusted for better control.  Palliative care consulted.   Assessment & Plan:   Principal Problem:   Abdominal pain Active Problems:   Pancreatic mass   Nausea and vomiting   Anemia   Leukocytosis   Malignant neoplasm of head of pancreas (HCC)   Dehydration   Hypokalemia   Failure to thrive in adult   Severe protein-calorie malnutrition (HCC)   Intractable nausea and vomiting  #1 intractable abdominal pain/nausea Secondary to advanced pancreatic head cancer.  CT abdomen and pelvis which was done showed increased size and number of liver mets compared to CT from 02/27/2020 as well as increased size of pancreatic mass, increased periaortic adenopathy, increased ascites, stable sclerotic bone lesions noted.  Patient with nausea complaining of feeling gassy.  Place on simethicone 160 mg p.o. 4 times daily.  Patient's home dose MS Contin was resumed and dose has been increased per oncology to 30 mg p.o. twice daily.  Patient received dose of IV morphine however still with significant abdominal pain.  Home  dose oral oxycodone resumed.  DC IV morphine placed on Dilaudid 1 mg IV every 4 hours as needed pain.  Consulted palliative care for pain management and goals of care.  2.  Dehydration Secondary to poor oral intake.  Continue IV fluids.  Follow.  3.  Metastatic pancreatic cancer stage IV Status post cycle 1 gemcitabine/Abraxane chemotherapy 03/25/2020.  Oncology informed of patient's admission and are following.  Per oncology too early to expect a response from chemotherapy.  Palliative care consult for goals of care.  4.  Constipation Felt secondary to narcotic pain medication.  Patient however with multiple loose stools and refused MiraLAX.  Discontinue MiraLAX.  Follow.  5.  Failure to thrive Likely secondary to problem #3.  On clears.  Follow.  6.  Severe protein calorie malnutrition Likely secondary to problem #3.  Once abdominal pain improves, nausea improved could place on nutritional supplementation.  Albumin level at 2.0.  We will give a dose of IV albumin x1.  7.  Hypokalemia Replete.  8.  Anemia, POA Likely secondary to metastatic pancreatic cancer.  Patient with no overt bleeding.  Check an anemia panel.  Follow H&H.  Transfusion threshold hemoglobin < 7.  9.  Leukocytosis Questionable etiology.  Patient afebrile.  Patient with no respiratory symptoms.  Chest x-ray with atelectasis versus infiltrate however doubt if infiltrate.  Leukocytosis trending down.  Urinalysis nitrite negative, leukocytes negative.  No need for antibiotics at this time.  Monitor with hydration.  Follow.  10.  Tachycardia Likely secondary to significant abdominal pain.  EKG done with a sinus tachycardia.  Give a dose of Lopressor 5 mg IV x1.  Pain management.  IV fluids.  Supportive care.  Follow.   DVT prophylaxis: Lovenox Code Status: Full Family Communication: Updated patient.  No family at bedside. Disposition:   Status is: Inpatient    Dispo: The patient is from: Home               Anticipated d/c is to: To be determined.  Likely home.              Anticipated d/c date is: 3 to 4 days.              Patient currently with metastatic pancreatic cancer, with significant abdominal pain, nausea, poor oral intake, dehydrated.  Currently not stable for discharge.       Consultants:   Oncology: Dr. [name] 03/30/2020  Procedures:   Chest x-ray 03/30/2020  Abdominal films 03/30/2020  CT abdomen and pelvis 03/28/2020    Antimicrobials:   None   Subjective: Was called by RN patient with a red [employer] due to heart rate of 143.  Blood pressure noted at 148/97, sats of 96% on room air, temp of 99.1.  Patient sitting up at bedside trying to urinate in the urinal.  Denies any chest pain.  No shortness of breath.  Complains of abdominal pain.  States he is feeling very gassy feels he needs to burp.  Patient noted to have loose stool this morning.  Patient states pain is not controlled on current regimen of fentanyl.  Objective: Vitals:   03/30/20 1228 03/30/20 1312 03/30/20 1419 03/30/20 1544  BP: 137/82 (!) 138/95 139/85 129/89  Pulse: (!) 112 (!) 124 (!) 120 (!) 130  Resp: (!) 23 (!) 22 (!) 24 (!) 23  Temp: 98 F (36.7 C) 97.7 F (36.5 C) 98.3 F (36.8 C) 97.6 F (36.4 C)  TempSrc: Oral Oral Oral Oral  SpO2: 96% 98% 97% 95%  Weight:      Height:        Intake/Output Summary (Last 24 hours) at 03/30/2020 1636 Last data filed at 03/30/2020 1130 Gross per 24 hour  Intake --  Output 400 ml  Net -400 ml   Filed Weights   03/28/20 1405 03/29/20 0537  Weight: 63.5 kg 62.8 kg    Examination:  General exam: NAD.  Cachectic.  Frail. Respiratory system: Clear to auscultation. Respiratory effort normal. Cardiovascular system: Tachycardic.  No JVD, no murmurs rubs or gallops.  1+ bilateral lower extremity edema.   Gastrointestinal system: Abdomen is nondistended, soft and tender to palpation in the epigastrium and upper abdominal region.  No rebound.  No  guarding.  Positive bowel sounds.  Central nervous system: Alert and oriented. No focal neurological deficits. Extremities: Symmetric 5 x 5 power. Skin: No rashes, lesions or ulcers Psychiatry: Judgement and insight appear normal. Mood & affect appropriate.     Data Reviewed: I have personally reviewed following labs and imaging studies  CBC: Recent Labs  Lab 03/24/20 1211 03/28/20 1546 03/29/20 0415 03/30/20 0545  WBC 31.4* 43.5* 37.8* 19.7*  NEUTROABS 27.0* 41.4*  --   --   HGB 11.5* 9.7* 9.3* 9.1*  HCT 32.9* 26.6* 26.1* 25.1*  MCV 78.5* 74.5* 75.0* 74.7*  PLT 720* 608* 422* 076    Basic Metabolic Panel: Recent Labs  Lab 03/28/20 1546 03/29/20 0415 03/30/20 0545  NA 137 138 134*  K 3.8 4.1 3.4*  CL 95* 99 102  CO2 31 28 24   GLUCOSE 156* 145* 141*  BUN 21* 17 11  CREATININE 0.41*  0.38* 0.37*  CALCIUM 8.1* 7.9* 7.1*  MG 2.3  --  2.2  PHOS 2.2*  --   --     GFR: Estimated Creatinine Clearance: 89.4 mL/min (A) (by C-G formula based on SCr of 0.37 mg/dL (L)).  Liver Function Tests: Recent Labs  Lab 03/28/20 1546 03/29/20 0415  AST 67* 67*  ALT 43 40  ALKPHOS 264* 247*  BILITOT 1.3* 0.8  PROT 6.4* 5.8*  ALBUMIN 2.2* 2.0*    CBG: No results for input(s): GLUCAP in the last 168 hours.   Recent Results (from the past 240 hour(s))  SARS Coronavirus 2 by RT PCR (hospital order, performed in [facility] hospital lab) Nasopharyngeal Nasopharyngeal Swab     Status: None   Collection Time: 03/28/20  9:00 PM   Specimen: Nasopharyngeal Swab  Result Value Ref Range Status   SARS Coronavirus 2 NEGATIVE NEGATIVE Final    Comment: (NOTE) SARS-CoV-2 target nucleic acids are NOT DETECTED.  The SARS-CoV-2 RNA is generally detectable in upper and lower respiratory specimens during the acute phase of infection. The lowest concentration of SARS-CoV-2 viral copies this assay can detect is 250 copies / mL. A negative result does not preclude SARS-CoV-2 infection and  should not be used as the sole basis for treatment or other patient management decisions.  A negative result may occur with improper specimen collection / handling, submission of specimen other than nasopharyngeal swab, presence of viral mutation(s) within the areas targeted by this assay, and inadequate number of viral copies (<250 copies / mL). A negative result must be combined with clinical observations, patient history, and epidemiological information.  Fact Sheet for Patients:   StrictlyIdeas.no  Fact Sheet for Healthcare Providers: BankingDealers.co.za  This test is not yet approved or  cleared by the [place] FDA and has been authorized for detection and/or diagnosis of SARS-CoV-2 by FDA under an Emergency Use Authorization (EUA).  This EUA will remain in effect (meaning this test can be used) for the duration of the COVID-19 declaration under Section 564(b)(1) of the Act, 21 U.S.C. section 360bbb-3(b)(1), unless the authorization is terminated or revoked sooner.  Performed at [facility], [place] [address] [place], [place] [address]          Radiology Studies: DG Chest 2 View  Result Date: 03/30/2020 CLINICAL DATA:  Metastatic pancreatic cancer. EXAM: CHEST - 2 VIEW COMPARISON:  None. FINDINGS: Low volume film with right base atelectasis or infiltrate in small right pleural effusion. Right Port-A-Cath tip overlies the right atrium. The visualized bony structures of the thorax show now acute abnormality. IMPRESSION: Low volume film with right base atelectasis or infiltrate and small right pleural effusion. Electronically Signed   By: Misty Stanley M.D.   On: 03/30/2020 11:59   CT Abdomen Pelvis W Contrast  Result Date: 03/28/2020 CLINICAL DATA:  Suspected bowel obstruction. EXAM: CT ABDOMEN AND PELVIS WITH CONTRAST TECHNIQUE: Multidetector CT imaging of the abdomen and pelvis was performed using the  standard protocol following bolus administration of intravenous contrast. CONTRAST:  163mL OMNIPAQUE IOHEXOL 300 MG/ML  SOLN COMPARISON:  February 27, 2020 FINDINGS: Lower chest: Numerous 2 mm and 3 mm noncalcified lung nodules are seen within the bilateral lung bases. Hepatobiliary: Innumerable heterogeneous ill-defined low-attenuation liver lesions are seen scattered throughout the right and left lobes. These are increased in size and number when compared to the prior study. No gallstones, gallbladder wall thickening, or biliary dilatation. Pancreas: A 3.9 cm x 3.7 cm lobulated heterogeneous low-attenuation  mass is seen within the head of the pancreas. This is increased in size when compared to the prior exam. Spleen: Normal in size without focal abnormality. Adrenals/Urinary Tract: Adrenal glands are unremarkable. Kidneys are normal, without renal calculi or hydronephrosis. A 1.6 cm cyst is seen within the lateral aspect of the mid right kidney. Bladder is unremarkable. Stomach/Bowel: Stomach is within normal limits. The appendix is not clearly identified. No evidence of bowel wall thickening, distention, or inflammatory changes. Vascular/Lymphatic: There is mild calcification of the abdominal aorta. Moderate severity para-aortic lymphadenopathy is seen which is increased in severity when compared to the prior study. Reproductive: There is moderate to marked severity prostate gland enlargement. Other: No abdominal wall hernia or abnormality. A moderate to marked amount of free fluid is seen within the abdomen and pelvis. This is markedly increased in severity when compared to the prior study. Musculoskeletal: A 0.9 cm sclerotic focus is seen within the mid to lower sacrum on the left. A 0.4 cm sclerotic focus is noted within the posteromedial aspect of the right acetabulum. An additional 0.8 cm sclerotic focus is seen within the proximal right femur. These areas are all stable in size and appearance when compared to  the prior study. IMPRESSION: 1. Innumerable heterogeneous ill-defined low-attenuation liver lesions which are increased in size and number when compared to the prior study, consistent with worsening metastatic disease. 2. Interval increase in size of a lobulated heterogeneous low-attenuation mass within the head of the pancreas, consistent with primary pancreatic neoplasm. 3. Moderate severity para-aortic lymphadenopathy, increased in severity when compared to the prior study. 4. Numerous 2 mm and 3 mm noncalcified lung nodules within the bilateral lung bases. These findings are concerning for metastatic disease. 5. Moderate to marked severity prostate gland enlargement. 6. Stable sclerotic foci within the mid to lower sacrum on the left, posteromedial aspect of the right acetabulum, and proximal right femur. These areas are all stable in size and appearance when compared to the prior study. 7. Moderate to marked amount of free fluid within the abdomen and pelvis which is markedly increased in severity when compared to the prior study. 8. Aortic atherosclerosis. Aortic Atherosclerosis (ICD10-I70.0). Electronically Signed   By: Virgina Norfolk M.D.   On: 03/28/2020 20:24   DG Abd 2 Views  Result Date: 03/30/2020 CLINICAL DATA:  Metastatic pancreatic cancer with recurrent nausea and vomiting. EXAM: ABDOMEN - 2 VIEW COMPARISON:  CT scan 03/28/2020 FINDINGS: Two views study shows no intraperitoneal free air. Gaseous distention of small bowel and colon is evident. Centralization of bowel loops suggests ascites. No worrisome lytic or sclerotic osseous abnormality. IMPRESSION: Gaseous distention of small bowel and colon. Imaging features suggest ileus. No intraperitoneal free air Electronically Signed   By: Misty Stanley M.D.   On: 03/30/2020 11:58        Scheduled Meds: . (feeding supplement) PROSource Plus  30 mL Oral BID BM  . Chlorhexidine Gluconate Cloth  6 each Topical Daily  . enoxaparin (LOVENOX)  injection  40 mg Subcutaneous Q24H  . feeding supplement  1 Container Oral TID BM  . lipase/protease/amylase  36,000 Units Oral TID AC  . morphine  30 mg Oral Q12H  . multivitamin with minerals  1 tablet Oral Daily  . [START ON 03/31/2020] senna-docusate  1 tablet Oral QHS  . simethicone  160 mg Oral QID   Continuous Infusions: . albumin human    . dextrose 5 % and 0.9% NaCl 100 mL/hr at 03/30/20 1147  .  potassium chloride 10 mEq (03/30/20 1549)     LOS: 1 day    Time spent: 40 minutes    Irine Seal, MD [facility]   To contact the attending provider between 7A-7P or the covering provider during after hours 7P-7A, please log into the web site [email or web address] and access using universal [place] password for that web site. If you do not have the password, please call the hospital operator.  03/30/2020, 4:36 PM

## 2020-03-30 NOTE — Progress Notes (Signed)
   03/30/20 1544  Assess: MEWS Score  Temp 97.6 F (36.4 C)  BP 129/89  Pulse Rate (!) 130  Resp (!) 23  SpO2 95 %  O2 Device Room Air  Assess: MEWS Score  MEWS Temp 0  MEWS Systolic 0  MEWS Pulse 3  MEWS RR 1  MEWS LOC 0  MEWS Score 4  MEWS Score Color Red  Assess: if the MEWS score is Yellow or Red  Were vital signs taken at a resting state? Yes  Focused Assessment No change from prior assessment  Early Detection of Sepsis Score *See Row Information* Medium  MEWS guidelines implemented *See Row Information* No, other (Comment) (still continuing red mews protocol. )  Treat  MEWS Interventions Escalated (See documentation below)  Take Vital Signs  Increase Vital Sign Frequency  Red: Q 1hr X 4 then Q 4hr X 4, if remains red, continue Q 4hrs  Escalate  MEWS: Escalate Red: discuss with charge nurse/RN and provider, consider discussing with RRT  Notify: Charge Nurse/RN  Name of Charge Nurse/RN Notified Kaitlyn RN   Date Charge Nurse/RN Notified 03/30/20  Time Charge Nurse/RN Notified 1600  Notify: Provider  Provider Name/Title Dr. Grandville Silos   Date Provider Notified 03/30/20  Time Provider Notified 6237  Notification Type Face-to-face  Notification Reason Other (Comment) (remains red muse )  Response No new orders  Date of Provider Response 03/30/20  Time of Provider Response 1600

## 2020-03-31 ENCOUNTER — Encounter: Payer: Self-pay | Admitting: Oncology

## 2020-03-31 ENCOUNTER — Encounter (HOSPITAL_COMMUNITY): Payer: Self-pay | Admitting: Internal Medicine

## 2020-03-31 ENCOUNTER — Inpatient Hospital Stay (HOSPITAL_COMMUNITY): Payer: BC Managed Care – PPO

## 2020-03-31 DIAGNOSIS — K567 Ileus, unspecified: Secondary | ICD-10-CM

## 2020-03-31 DIAGNOSIS — R1013 Epigastric pain: Secondary | ICD-10-CM | POA: Diagnosis not present

## 2020-03-31 DIAGNOSIS — E860 Dehydration: Secondary | ICD-10-CM | POA: Diagnosis not present

## 2020-03-31 DIAGNOSIS — R627 Adult failure to thrive: Secondary | ICD-10-CM | POA: Diagnosis not present

## 2020-03-31 DIAGNOSIS — E876 Hypokalemia: Secondary | ICD-10-CM | POA: Diagnosis not present

## 2020-03-31 LAB — COMPREHENSIVE METABOLIC PANEL
ALT: 28 U/L (ref 0–44)
AST: 30 U/L (ref 15–41)
Albumin: 2 g/dL — ABNORMAL LOW (ref 3.5–5.0)
Alkaline Phosphatase: 215 U/L — ABNORMAL HIGH (ref 38–126)
Anion gap: 8 (ref 5–15)
BUN: 12 mg/dL (ref 6–20)
CO2: 24 mmol/L (ref 22–32)
Calcium: 7.3 mg/dL — ABNORMAL LOW (ref 8.9–10.3)
Chloride: 104 mmol/L (ref 98–111)
Creatinine, Ser: 0.36 mg/dL — ABNORMAL LOW (ref 0.61–1.24)
GFR calc Af Amer: >60 mL/min (ref >60)
GFR calc non Af Amer: >60 mL/min (ref >60)
Glucose, Bld: 126 mg/dL — ABNORMAL HIGH (ref 70–99)
Potassium: 4.1 mmol/L (ref 3.5–5.1)
Sodium: 136 mmol/L (ref 135–145)
Total Bilirubin: 0.9 mg/dL (ref 0.3–1.2)
Total Protein: 5.3 g/dL — ABNORMAL LOW (ref 6.5–8.1)

## 2020-03-31 LAB — CBC WITH DIFFERENTIAL/PLATELET
Abs Immature Granulocytes: 0.13 10*3/uL — ABNORMAL HIGH (ref 0.00–0.07)
Basophils Absolute: 0 10*3/uL (ref 0.0–0.1)
Basophils Relative: 0 %
Eosinophils Absolute: 0 10*3/uL (ref 0.0–0.5)
Eosinophils Relative: 0 %
HCT: 25 % — ABNORMAL LOW (ref 39.0–52.0)
Hemoglobin: 9 g/dL — ABNORMAL LOW (ref 13.0–17.0)
Immature Granulocytes: 1 %
Lymphocytes Relative: 4 %
Lymphs Abs: 0.6 10*3/uL — ABNORMAL LOW (ref 0.7–4.0)
MCH: 26.9 pg (ref 26.0–34.0)
MCHC: 36 g/dL (ref 30.0–36.0)
MCV: 74.6 fL — ABNORMAL LOW (ref 80.0–100.0)
Monocytes Absolute: 0.6 10*3/uL (ref 0.1–1.0)
Monocytes Relative: 4 %
Neutro Abs: 13.5 10*3/uL — ABNORMAL HIGH (ref 1.7–7.7)
Neutrophils Relative %: 91 %
Platelets: 191 10*3/uL (ref 150–400)
RBC: 3.35 MIL/uL — ABNORMAL LOW (ref 4.22–5.81)
RDW: 13.4 % (ref 11.5–15.5)
WBC: 14.9 10*3/uL — ABNORMAL HIGH (ref 4.0–10.5)
nRBC: 0.2 % (ref 0.0–0.2)

## 2020-03-31 LAB — IRON AND TIBC
Iron: 11 ug/dL — ABNORMAL LOW (ref 45–182)
Saturation Ratios: 13 % — ABNORMAL LOW (ref 17.9–39.5)
TIBC: 85 ug/dL — ABNORMAL LOW (ref 250–450)
UIBC: 74 ug/dL

## 2020-03-31 LAB — FOLATE: Folate: 8.1 ng/mL (ref >5.9)

## 2020-03-31 LAB — VITAMIN B12: Vitamin B-12: 2433 pg/mL — ABNORMAL HIGH (ref 180–914)

## 2020-03-31 LAB — FERRITIN: Ferritin: 1555 ng/mL — ABNORMAL HIGH (ref 24–336)

## 2020-03-31 LAB — URINE CULTURE: Culture: NO GROWTH

## 2020-03-31 MED ORDER — BISACODYL 10 MG RE SUPP
10.0000 mg | Freq: Every day | RECTAL | Status: DC
  Administered 2020-03-31 – 2020-04-01 (×2): 10 mg via RECTAL
  Filled 2020-03-31 (×4): qty 1

## 2020-03-31 MED ORDER — GUAIFENESIN ER 600 MG PO TB12
1200.0000 mg | ORAL_TABLET | Freq: Two times a day (BID) | ORAL | Status: DC
  Administered 2020-04-01 – 2020-04-06 (×9): 1200 mg via ORAL
  Administered 2020-04-07: 600 mg via ORAL
  Filled 2020-03-31 (×11): qty 2

## 2020-03-31 MED ORDER — LORAZEPAM 2 MG/ML IJ SOLN
0.5000 mg | Freq: Two times a day (BID) | INTRAMUSCULAR | Status: DC | PRN
  Administered 2020-03-31 – 2020-04-06 (×10): 0.5 mg via INTRAVENOUS
  Filled 2020-03-31 (×10): qty 1

## 2020-03-31 MED ORDER — HYDROMORPHONE HCL 1 MG/ML IJ SOLN
1.0000 mg | INTRAMUSCULAR | Status: DC | PRN
  Administered 2020-03-31 – 2020-04-03 (×11): 1 mg via INTRAVENOUS
  Filled 2020-03-31 (×11): qty 1

## 2020-03-31 MED ORDER — POLYETHYLENE GLYCOL 3350 17 G PO PACK
17.0000 g | PACK | Freq: Every day | ORAL | Status: DC
  Administered 2020-03-31 – 2020-04-07 (×5): 17 g via ORAL
  Filled 2020-03-31 (×7): qty 1

## 2020-03-31 MED ORDER — PREDNISONE 5 MG PO TABS
10.0000 mg | ORAL_TABLET | Freq: Every day | ORAL | Status: DC
  Administered 2020-03-31 – 2020-04-01 (×2): 10 mg via ORAL
  Filled 2020-03-31 (×2): qty 2

## 2020-03-31 NOTE — Progress Notes (Signed)
Received call from patient spouse Verdis Frederickson regarding [employer]. Advised what is needed to apply. She will bring at next visit.  She has my card for any additional financial questions or concerns.

## 2020-03-31 NOTE — Progress Notes (Signed)
°   03/30/20 2035  Assess: MEWS Score  Temp 97.8 F (36.6 C)  BP 131/82  Pulse Rate (!) 133  SpO2 98 %  Assess: MEWS Score  MEWS Temp 0  MEWS Systolic 0  MEWS Pulse 3  MEWS RR 1  MEWS LOC 0  MEWS Score 4  MEWS Score Color Red  Assess: if the MEWS score is Yellow or Red  Were vital signs taken at a resting state? Yes  Focused Assessment No change from prior assessment  Early Detection of Sepsis Score *See Row Information* Low  MEWS guidelines implemented *See Row Information* No, previously red, continue vital signs every 4 hours (Hourly vitals)  Treat  MEWS Interventions Administered prn meds/treatments  Take Vital Signs  Increase Vital Sign Frequency  Red: Q 1hr X 4 then Q 4hr X 4, if remains red, continue Q 4hrs  Escalate  MEWS: Escalate  (addressed by Provider earlier when he initially turned RED)  Document  Patient Outcome Other (Comment) (stable and medicated. cont to monitor)  Progress note created (see row info) Yes

## 2020-03-31 NOTE — Progress Notes (Addendum)
PROGRESS NOTE    Joe [name]  EXB:[id number] DOB: [birth year] DOA: 03/28/2020 PCP: Patient, No Pcp Per    Chief Complaint  Patient presents with  . Dehydration    Brief Narrative:  Patient is an unfortunate 59 year old gentleman history of recently diagnosed metastatic prostate cancer status post completion of cycle 1 gemcitabine/Abraxane chemotherapy on 03/25/2020 presented to the ED with worsening abdominal pain, nausea, poor oral intake, noted to be dehydrated.  CT abdomen and pelvis done showed nodules in the lung bases, increased size and number of liver metastases compared to 02/27/2020 CT, increased size of pancreatic mass, increased periaortic adenopathy, prostate enlarged, increased ascites, stable sclerotic bone lesions noted.  Patient placed back on MS Contin twice daily and fentanyl initially.  Oncology consulted and informed of patient's admission.  Pain medications being adjusted for better control.  Palliative care consulted.   Assessment & Plan:   Principal Problem:   Abdominal pain Active Problems:   Pancreatic mass   Nausea and vomiting   Anemia   Leukocytosis   Malignant neoplasm of head of pancreas (HCC)   Dehydration   Hypokalemia   Failure to thrive in adult   Severe protein-calorie malnutrition (HCC)   Intractable nausea and vomiting  1 intractable abdominal pain/nausea/colonic ileus versus obstruction Secondary to advanced pancreatic head cancer versus concern for ileus versus bowel obstruction secondary to pancreatic cancer..  CT abdomen and pelvis which was done showed increased size and number of liver mets compared to CT from 02/27/2020 as well as increased size of pancreatic mass, increased periaortic adenopathy, increased ascites, stable sclerotic bone lesions noted.  Patient with nausea complaining of feeling gassy.  Patient currently on simethicone 160 mg p.o. 4 times daily. Patient's home dose MS Contin was resumed and dose has been increased per  oncology to 30 mg p.o. twice daily.  Patient received dose of IV morphine however still with significant abdominal pain.  Home dose oral oxycodone resumed.  IV morphine has been discontinued and patient currently on IV Dilaudid every 4 hours as needed for pain.   Patient with abdominal distention, some abdominal tightness and still with abdominal pain today.  Abdominal films ordered this morning with gaseous distention of the transverse colon without definite evidence of obstruction or wall thickening.  Concern for possible ileus versus small bowel obstruction secondary to pancreatic cancer.  Will make patient n.p.o. except ice chips and sips with meds.  Keep potassium >4.  Keep magnesium > 2.  Consult with general surgery for further evaluation and management.  Palliative care also consulted for goals of care.  2.  Dehydration Secondary to poor oral intake, nausea, concern for possible ileus.  Continue IV fluids.  Follow.  3.  Metastatic pancreatic cancer stage IV Status post cycle 1 gemcitabine/Abraxane chemotherapy 03/25/2020.  Oncology informed of patient's admission and are following.  Per oncology too early to expect a response from chemotherapy.  Palliative care consult for goals of care pending.  Oncology following..  4.  Constipation Felt secondary to narcotic pain medication.  Patient however with multiple loose stools and refused MiraLAX.  MiraLAX has been discontinued.  Patient with abdominal distention with no significant improvement with abdominal pain.  Abdominal films obtained with gaseous distention colon without definite evidence of obstruction or wall thickening.  Will make patient n.p.o.  Consult with general surgery for further evaluation and management.    5.  Failure to thrive Likely secondary to problem #3.  On clears.  Follow.  6.  Severe  protein calorie malnutrition Likely secondary to problem #3.  Once abdominal pain improves, nausea improved could place on nutritional  supplementation.  Albumin level at 2.0.  Status post IV albumin x1.   7.  Hypokalemia Repleted.  Potassium of 4.1.  8.  Anemia of chronic disease, POA Likely secondary to metastatic pancreatic cancer.  Patient with no overt bleeding.  Anemia panel with iron level of 11, TIBC of 85, ferritin of 1555, folate of 8.1, vitamin B12 of 2433.  H&H stable at 9.0.  Transfusion threshold hemoglobin < 7.  9.  Leukocytosis Questionable etiology.  Patient afebrile.  Patient with no respiratory symptoms.  Chest x-ray with atelectasis versus infiltrate however doubt if infiltrate.  Leukocytosis trending down.  Urinalysis nitrite negative, leukocytes negative.  No need for antibiotics at this time.  Monitor with hydration.  Follow.  10.  Tachycardia Likely secondary to significant abdominal pain.  EKG done with a sinus tachycardia.  Continue current pain management.  IV fluids.  IV Lopressor as needed.  Supportive care.    DVT prophylaxis: Lovenox Code Status: Full Family Communication: Updated patient and wife at bedside. Disposition:   Status is: Inpatient    Dispo: The patient is from: Home              Anticipated d/c is to: To be determined.  Likely home.              Anticipated d/c date is: 3 to 4 days.              Patient currently with metastatic pancreatic cancer, with significant abdominal pain, nausea, poor oral intake, dehydrated.  Concern for possible ileus.  Currently not stable for discharge.       Consultants:   Oncology: Dr. [name] 03/30/2020  Palliative care pending  Procedures:   Chest x-ray 03/30/2020  Abdominal films 03/30/2020, 03/31/2020  CT abdomen and pelvis 03/28/2020    Antimicrobials:   None   Subjective: Patient sitting up in bed.  Denies any shortness of breath.  No chest pain.  Still with abdominal pain however states some relief with current IV pain regimen.  No emesis.  Complaining of feeling that he needs to burp however unable to burp enough  complaining of feeling that he needs to burp however unable to burp enough.  Feels like he needs to have a bowel movement however has not had a bowel movement today.   Objective: Vitals:   03/31/20 0220 03/31/20 0604 03/31/20 0651 03/31/20 0959  BP: 119/82 128/79 129/86 130/79  Pulse: (!) 119 (!) 122 (!) 122 (!) 122  Resp:  14  (!) 22  Temp: (!) 97.5 F (36.4 C) 97.7 F (36.5 C) (!) 97.5 F (36.4 C) 98 F (36.7 C)  TempSrc: Oral Oral Oral Oral  SpO2: 92% 93% 93% 94%  Weight:      Height:        Intake/Output Summary (Last 24 hours) at 03/31/2020 1150 Last data filed at 03/31/2020 0338 Gross per 24 hour  Intake --  Output 400 ml  Net -400 ml   Filed Weights   03/28/20 1405 03/29/20 0537  Weight: 63.5 kg 62.8 kg    Examination:  General exam: Cachectic./Emaciated/temporal wasting. Respiratory system: CTAB.  No wheezes, no crackles, no rhonchi.  Normal respiratory effort.   Cardiovascular system: Tachycardia.  No JVD, no murmurs rubs or gallops.  Trace bilateral lower extremity edema.  Gastrointestinal system: Abdomen is distended, somewhat tight, hypoactive bowel sounds, some tenderness to  palpation diffusely.  No rebound.  No guarding.  Central nervous system: Alert and oriented. No focal neurological deficits. Extremities: Symmetric 5 x 5 power. Skin: No rashes, lesions or ulcers Psychiatry: Judgement and insight appear normal. Mood & affect appropriate.     Data Reviewed: I have personally reviewed following labs and imaging studies  CBC: Recent Labs  Lab 03/24/20 1211 03/28/20 1546 03/29/20 0415 03/30/20 0545  WBC 31.4* 43.5* 37.8* 19.7*  NEUTROABS 27.0* 41.4*  --   --   HGB 11.5* 9.7* 9.3* 9.1*  HCT 32.9* 26.6* 26.1* 25.1*  MCV 78.5* 74.5* 75.0* 74.7*  PLT 720* 608* 422* 967    Basic Metabolic Panel: Recent Labs  Lab 03/28/20 1546 03/29/20 0415 03/30/20 0545 03/31/20 0354  NA 137 138 134* 136  K 3.8 4.1 3.4* 4.1  CL 95* 99 102 104  CO2 31 28 24  24   GLUCOSE 156* 145* 141* 126*  BUN 21* 17 11 12   CREATININE 0.41* 0.38* 0.37* 0.36*  CALCIUM 8.1* 7.9* 7.1* 7.3*  MG 2.3  --  2.2  --   PHOS 2.2*  --   --   --     GFR: Estimated Creatinine Clearance: 89.4 mL/min (A) (by C-G formula based on SCr of 0.36 mg/dL (L)).  Liver Function Tests: Recent Labs  Lab 03/28/20 1546 03/29/20 0415 03/31/20 0354  AST 67* 67* 30  ALT 43 40 28  ALKPHOS 264* 247* 215*  BILITOT 1.3* 0.8 0.9  PROT 6.4* 5.8* 5.3*  ALBUMIN 2.2* 2.0* 2.0*    CBG: No results for input(s): GLUCAP in the last 168 hours.   Recent Results (from the past 240 hour(s))  SARS Coronavirus 2 by RT PCR (hospital order, performed in [facility] hospital lab) Nasopharyngeal Nasopharyngeal Swab     Status: None   Collection Time: 03/28/20  9:00 PM   Specimen: Nasopharyngeal Swab  Result Value Ref Range Status   SARS Coronavirus 2 NEGATIVE NEGATIVE Final    Comment: (NOTE) SARS-CoV-2 target nucleic acids are NOT DETECTED.  The SARS-CoV-2 RNA is generally detectable in upper and lower respiratory specimens during the acute phase of infection. The lowest concentration of SARS-CoV-2 viral copies this assay can detect is 250 copies / mL. A negative result does not preclude SARS-CoV-2 infection and should not be used as the sole basis for treatment or other patient management decisions.  A negative result may occur with improper specimen collection / handling, submission of specimen other than nasopharyngeal swab, presence of viral mutation(s) within the areas targeted by this assay, and inadequate number of viral copies (<250 copies / mL). A negative result must be combined with clinical observations, patient history, and epidemiological information.  Fact Sheet for Patients:   StrictlyIdeas.no  Fact Sheet for Healthcare Providers: BankingDealers.co.za  This test is not yet approved or  cleared by the [place] FDA  and has been authorized for detection and/or diagnosis of SARS-CoV-2 by FDA under an Emergency Use Authorization (EUA).  This EUA will remain in effect (meaning this test can be used) for the duration of the COVID-19 declaration under Section 564(b)(1) of the Act, 21 U.S.C. section 360bbb-3(b)(1), unless the authorization is terminated or revoked sooner.  Performed at [facility], [place] [address], [place], [place] [address]          Radiology Studies: DG Chest 2 View  Result Date: 03/30/2020 CLINICAL DATA:  Metastatic pancreatic cancer. EXAM: CHEST - 2 VIEW COMPARISON:  None. FINDINGS: Low volume film  with right base atelectasis or infiltrate in small right pleural effusion. Right Port-A-Cath tip overlies the right atrium. The visualized bony structures of the thorax show now acute abnormality. IMPRESSION: Low volume film with right base atelectasis or infiltrate and small right pleural effusion. Electronically Signed   By: Misty Stanley M.D.   On: 03/30/2020 11:59   DG Abd 2 Views  Result Date: 03/30/2020 CLINICAL DATA:  Metastatic pancreatic cancer with recurrent nausea and vomiting. EXAM: ABDOMEN - 2 VIEW COMPARISON:  CT scan 03/28/2020 FINDINGS: Two views study shows no intraperitoneal free air. Gaseous distention of small bowel and colon is evident. Centralization of bowel loops suggests ascites. No worrisome lytic or sclerotic osseous abnormality. IMPRESSION: Gaseous distention of small bowel and colon. Imaging features suggest ileus. No intraperitoneal free air Electronically Signed   By: Misty Stanley M.D.   On: 03/30/2020 11:58        Scheduled Meds: . (feeding supplement) PROSource Plus  30 mL Oral BID BM  . Chlorhexidine Gluconate Cloth  6 each Topical Daily  . enoxaparin (LOVENOX) injection  40 mg Subcutaneous Q24H  . feeding supplement  1 Container Oral TID BM  . lipase/protease/amylase  36,000 Units Oral TID AC  . morphine  30 mg Oral Q12H  .  multivitamin with minerals  1 tablet Oral Daily  . predniSONE  10 mg Oral Q breakfast  . senna-docusate  1 tablet Oral QHS  . simethicone  160 mg Oral QID   Continuous Infusions: . dextrose 5 % and 0.9% NaCl 100 mL/hr at 03/31/20 0359     LOS: 2 days    Time spent: 35 minutes    Irine Seal, MD [facility]   To contact the attending provider between 7A-7P or the covering provider during after hours 7P-7A, please log into the web site [email or web address] and access using universal [place] password for that web site. If you do not have the password, please call the hospital operator.  03/31/2020, 11:50 AM

## 2020-03-31 NOTE — Progress Notes (Signed)
   03/31/20 2127  Vitals  Temp 97.6 F (36.4 C)  Temp Source Oral  BP 124/84  MAP (mmHg) 98  BP Location Left Arm  BP Method Automatic  Patient Position (if appropriate) Sitting  Pulse Rate (!) 122  Resp 18  Level of Consciousness  Level of Consciousness Alert  MEWS COLOR  MEWS Score Color Yellow  Pain Assessment  Pain Scale 0-10  Pain Score 10  Pain Type Acute pain  Pain Location Abdomen  Pain Orientation Right;Left  Pain Radiating Towards Flanks  Pain Descriptors / Indicators Pressure  Pain Frequency Constant  Pain Onset On-going  Pain Intervention(s) Medication (See eMAR)  POSS Scale ([place]ero Opioid Sedation Scale)  POSS *See Group Information* 1-Acceptable,Awake and alert  MEWS Score  MEWS Temp 0  MEWS Systolic 0  MEWS Pulse 2  MEWS RR 0  MEWS LOC 0  MEWS Score 2  PRN Pain medication given to patient. Patient currently on medication regimen for his heart rate. Patient denies any heart racing no other complain. Will continue with mews protocol

## 2020-03-31 NOTE — Progress Notes (Addendum)
IP PROGRESS NOTE  Subjective:   Resting quietly this morning at the time my visit.  He had just received pain medication.  He has been complaining of persistent abdominal and back pain.  He continues to have mild nausea.  He has a poor appetite.  Objective: Vital signs in last 24 hours: Blood pressure 130/79, pulse (!) 122, temperature 98 F (36.7 C), temperature source Oral, resp. rate (!) 22, height 6\' 1"  (1.854 m), weight 62.8 kg, SpO2 94 %.  Intake/Output from previous day: 07/21 0701 - 07/22 0700 In: -  Out: 800 [Urine:800]  Physical Exam:  HEENT: No thrush Lungs: Clear bilaterally, no respiratory distress Cardiac: Regular rate and rhythm, tachycardia Abdomen: Mildly distended, no mass, tender in the mid upper abdomen Extremities: No leg edema  Portacath/PICC-without erythema  Lab Results: Recent Labs    03/29/20 0415 03/30/20 0545  WBC 37.8* 19.7*  HGB 9.3* 9.1*  HCT 26.1* 25.1*  PLT 422* 332    BMET Recent Labs    03/30/20 0545 03/31/20 0354  NA 134* 136  K 3.4* 4.1  CL 102 104  CO2 24 24  GLUCOSE 141* 126*  BUN 11 12  CREATININE 0.37* 0.36*  CALCIUM 7.1* 7.3*    No results found for: CEA1  Studies/Results: DG Chest 2 View  Result Date: 03/30/2020 CLINICAL DATA:  Metastatic pancreatic cancer. EXAM: CHEST - 2 VIEW COMPARISON:  None. FINDINGS: Low volume film with right base atelectasis or infiltrate in small right pleural effusion. Right Port-A-Cath tip overlies the right atrium. The visualized bony structures of the thorax show now acute abnormality. IMPRESSION: Low volume film with right base atelectasis or infiltrate and small right pleural effusion. Electronically Signed   By: Misty Stanley M.D.   On: 03/30/2020 11:59   DG Abd 2 Views  Result Date: 03/30/2020 CLINICAL DATA:  Metastatic pancreatic cancer with recurrent nausea and vomiting. EXAM: ABDOMEN - 2 VIEW COMPARISON:  CT scan 03/28/2020 FINDINGS: Two views study shows no intraperitoneal  free air. Gaseous distention of small bowel and colon is evident. Centralization of bowel loops suggests ascites. No worrisome lytic or sclerotic osseous abnormality. IMPRESSION: Gaseous distention of small bowel and colon. Imaging features suggest ileus. No intraperitoneal free air Electronically Signed   By: Misty Stanley M.D.   On: 03/30/2020 11:58   CT images reviewed Medications: I have reviewed the patient's current medications.  Assessment/Plan: 1. Pancreas cancer-stage IV ? CT abdomen/pelvis 01/22/2020-multiple hypoattenuating/hypoenhancing liver lesions-indeterminate ? CT abdomen/pelvis 02/27/2020-ill-defined pancreas head mass, new and enlarging extensive hepatic metastases, retroperitoneal adenopathy ? MRI abdomen 02/28/2020-mass at the junction and head of the pancreas body, no vascular invasion, multiple hepatic metastases, retroperitoneal adenopathy ? Ultrasound-guided biopsy of a right liver lesion 03/01/2020-poorly differentiated carcinoma with necrosis, cytokeratin 7, cytokeratin 20, and GATA-3 positive ? Cycle 1 gemcitabine/Abraxane 03/25/2020 ? CT abdomen/pelvis 03/28/2020-increased pancreas mass, increased size and number of liver metastases, progressive ascites, stable sclerotic bone lesions at the sacrum, right acetabulum, and proximal right femur, increased periaortic lymphadenopathy 2. Pain secondary to #1 3. Family history of cancer-father with adrenal cancer 4. Constipation-likely secondary to narcotic analgesics 5. Hypercalcemia 03/15/2020. Zometa 03/16/2020.  6. Admission 03/29/2020 with abdominal pain, nausea, and dehydration   Mr. Spinello appears mildly improved.  He still has persistent pain and is requiring IV pain medication.  He has been started on MS Contin and was switched from morphine to Dilaudid yesterday.  Pain seems to be improved at the time my visit.  He still has intermittent  nausea.  Pain and nausea likely related to the primary pancreatic mass and metastatic  disease.  He is now day 7 following cycle 1 of gemcitabine/Abraxane.  It is too early to expect a response to chemotherapy.  He still has a poor appetite.  We may need to consider hospice care if his performance status does not improve with better pain control.  Recommendations: 1.  Continue MS Contin and Dilaudid for breakthrough pain.  He also has oxycodone 10 mg every 4 hours as needed for pain available to him.  We will continue to evaluate how much pain medication he is requiring and adjust medications as needed. 2.  Continue as needed antiemetics 3.  We will add prednisone 10 mg daily to stimulate his appetite. 4.  Continue IV hydration until appetite improved.   LOS: 2 days   [name], NP   03/31/2020, 10:37 AM Mr. Schar was interviewed and examined.  He is now at day 7 following cycle 1 gemcitabine/Abraxane.  He has persistent pain, but reports improvement.  He requests an appetite stimulant.  He is tachycardic.  The tachycardia is likely related to the advanced tumor burden, anemia, and pain.  He does not have other symptoms to suggest a pulmonary embolism, but this is possible.  He will need to remain in the hospital until he is able to control the pain with oral narcotics and is ambulatory.

## 2020-03-31 NOTE — Progress Notes (Signed)
   03/31/20 0651  Assess: MEWS Score  Temp (!) 97.5 F (36.4 C)  BP 129/86  Pulse Rate (!) 122  SpO2 93 %  Assess: MEWS Score  MEWS Temp 0  MEWS Systolic 0  MEWS Pulse 2  MEWS RR 0  MEWS LOC 0  MEWS Score 2  MEWS Score Color Yellow  Assess: if the MEWS score is Yellow or Red  Were vital signs taken at a resting state? Yes  Focused Assessment No change from prior assessment  Early Detection of Sepsis Score *See Row Information* Low  MEWS guidelines implemented *See Row Information* No, previously red, continue vital signs every 4 hours
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
## 2020-04-01 ENCOUNTER — Inpatient Hospital Stay (HOSPITAL_COMMUNITY): Payer: BC Managed Care – PPO

## 2020-04-01 DIAGNOSIS — E860 Dehydration: Secondary | ICD-10-CM | POA: Diagnosis not present

## 2020-04-01 DIAGNOSIS — C250 Malignant neoplasm of head of pancreas: Secondary | ICD-10-CM | POA: Diagnosis not present

## 2020-04-01 DIAGNOSIS — E876 Hypokalemia: Secondary | ICD-10-CM | POA: Diagnosis not present

## 2020-04-01 DIAGNOSIS — R109 Unspecified abdominal pain: Secondary | ICD-10-CM | POA: Diagnosis not present

## 2020-04-01 DIAGNOSIS — R1013 Epigastric pain: Secondary | ICD-10-CM | POA: Diagnosis not present

## 2020-04-01 DIAGNOSIS — Z515 Encounter for palliative care: Secondary | ICD-10-CM

## 2020-04-01 DIAGNOSIS — R627 Adult failure to thrive: Secondary | ICD-10-CM | POA: Diagnosis not present

## 2020-04-01 DIAGNOSIS — R112 Nausea with vomiting, unspecified: Secondary | ICD-10-CM | POA: Diagnosis not present

## 2020-04-01 LAB — BASIC METABOLIC PANEL
Anion gap: 9 (ref 5–15)
BUN: 10 mg/dL (ref 6–20)
CO2: 23 mmol/L (ref 22–32)
Calcium: 7.6 mg/dL — ABNORMAL LOW (ref 8.9–10.3)
Chloride: 102 mmol/L (ref 98–111)
Creatinine, Ser: 0.39 mg/dL — ABNORMAL LOW (ref 0.61–1.24)
GFR calc Af Amer: >60 mL/min (ref >60)
GFR calc non Af Amer: >60 mL/min (ref >60)
Glucose, Bld: 137 mg/dL — ABNORMAL HIGH (ref 70–99)
Potassium: 3.9 mmol/L (ref 3.5–5.1)
Sodium: 134 mmol/L — ABNORMAL LOW (ref 135–145)

## 2020-04-01 LAB — CBC
HCT: 24.4 % — ABNORMAL LOW (ref 39.0–52.0)
Hemoglobin: 8.7 g/dL — ABNORMAL LOW (ref 13.0–17.0)
MCH: 26.3 pg (ref 26.0–34.0)
MCHC: 35.7 g/dL (ref 30.0–36.0)
MCV: 73.7 fL — ABNORMAL LOW (ref 80.0–100.0)
Platelets: 178 10*3/uL (ref 150–400)
RBC: 3.31 MIL/uL — ABNORMAL LOW (ref 4.22–5.81)
RDW: 13.3 % (ref 11.5–15.5)
WBC: 15.8 10*3/uL — ABNORMAL HIGH (ref 4.0–10.5)
nRBC: 0.3 % — ABNORMAL HIGH (ref 0.0–0.2)

## 2020-04-01 LAB — MAGNESIUM: Magnesium: 2 mg/dL (ref 1.7–2.4)

## 2020-04-01 LAB — PREALBUMIN: Prealbumin: <5 mg/dL — ABNORMAL LOW (ref 18–38)

## 2020-04-01 MED ORDER — OXYCODONE HCL 20 MG/ML PO CONC: 10.0000 mg | ORAL | Status: DC | PRN

## 2020-04-01 MED ORDER — PREDNISONE 20 MG PO TABS
20.0000 mg | ORAL_TABLET | Freq: Every day | ORAL | Status: DC
  Administered 2020-04-02 – 2020-04-07 (×6): 20 mg via ORAL
  Filled 2020-04-01 (×6): qty 1

## 2020-04-01 MED ORDER — OXYCODONE HCL 5 MG/5ML PO SOLN
10.0000 mg | ORAL | Status: DC | PRN
  Administered 2020-04-01 (×2): 10 mg via ORAL
  Filled 2020-04-01 (×2): qty 10

## 2020-04-01 MED ORDER — ENSURE ENLIVE PO LIQD
237.0000 mL | Freq: Three times a day (TID) | ORAL | Status: DC
  Administered 2020-04-01 – 2020-04-02 (×3): 237 mL via ORAL

## 2020-04-01 MED ORDER — ALBUMIN HUMAN 25 % IV SOLN
25.0000 g | Freq: Four times a day (QID) | INTRAVENOUS | Status: AC
  Administered 2020-04-01 – 2020-04-02 (×4): 25 g via INTRAVENOUS
  Filled 2020-04-01 (×5): qty 100

## 2020-04-01 NOTE — Consult Note (Addendum)
Consultation Note Date: 04/01/2020   Patient Name: Joe [name]  DOB: 06/22/1961  MRN: [id number]  Age / Sex: 59 y.o., male  PCP: Patient, No Pcp Per Referring Physician: Eugenie Filler, MD  Reason for Consultation: Establishing goals of care  HPI/Patient Profile: 59 y.o. male  with past medical history of recent diagnosis of metastatic pancreatic cancer s/p completion of cycle 1 gemcitabine/Abrazane chemo admitted on 03/28/2020 with abdominal pain, nausea, poor oral intake. CT abdomen and pelvis done showed nodules in the lung bases, increased size and number of liver metastases compared to 02/27/2020 CT, increased size of pancreatic mass, increased periaortic adenopathy, prostate enlarged, increased ascites, stable sclerotic bone lesions noted. Also concern for ileus versus bowel obstruction secondary to pancreatic cancer. Surgery following and recommending conservative management as patient is a poor surgical candidate. Tolerating full liquid diet. Palliative medicine consultation for goals of care.   Clinical Assessment and Goals of Care:  I have reviewed medical records, discussed with Dr. Andria [name] Oncology NP, and RN and briefly met with patient at bedside this morning. F/u GOC with patient and wife Joe [name]) this afternoon. Patient's pain is better controlled this afternoon on current regimen that oncology is managing. Joe [name] is awake, alert, oriented and very engaged in discussion.   I introduced Palliative Medicine as specialized medical care for people living with serious illness. It focuses on providing relief from the symptoms and stress of a serious illness. The goal is to improve quality of life for both the patient and the family.  We discussed a brief life review of the patient. Married to Joe [name] for over 30 years. They have 4 children, one unfortunately deceased at the age of 70. Joe [name]  works for [name]. Discussed journey since recent diagnosis of cancer.  Joe [name] is a wonderful, intelligent man. He is a man of deep Joe [name] faith and spends 78 of conversation sharing scripture and analogies. He is not fearful of his diagnosis. He believes in the power of God being in control of this and not science. He believes in miracles. He is fighting "past the end" and shares his belief in life after death. "I'll be back." Therapeutic listening. Spiritual support provided. Wife spends 18 of time listening and shares her belief that God will heal him. She was a miracle many years ago with she was critically ill and required cardiac bypass surgery.   Discussed diagnoses, interventions, plan of care. He is very appreciative of conversations with Dr. Benay [name] and speaks of hope to be strong enough for secondary round of chemotherapy by next Friday, July 30th. He is eager for his diet to be advanced and hopeful prednisone will stimulate his appetite. He again has no fears and leaves this all in God's hands.   Advanced directives and concepts specific to code status were discussed. Patient and wife share they would wish for daughter, Joe [name] to be documented HCPOA and she would better be able to handle making decisions for Joe [name] if he did not have capacity. Wife called daughter while  this NP at bedside and she understands and respects her father's decision to designate her as HCPOA. Family plans to review AD packet and consider completing in the near future. Reviewed living will.   Patient speaks of his desire for FULL code and for CPR to be attempted, sharing that it doesn't matter because God has the final say at that moment in his life. God will decide if he would wish for [facility] to survive CPR or will be laughing from heaven. Encouraged ongoing discussions as time goes on. Introduced and left MOST form with wife.   Patient shares more stories, thoughts, and scripture. Therapeutic  listening.   Questions and concerns were addressed.  Hard Choices booklet left for review. PMT contact information given.     SUMMARY OF RECOMMENDATIONS    Patient desires ongoing FULL code/FULL scope treatment. A man of strong Christian faith. He believes God is in control, not science.   Continue current plan of care and medical management.  Patient/wife hopeful for improvement in his acute condition and symptoms.   Continue outpatient oncology follow-up. Patient/wife hopeful he will be a candidate for further chemotherapy.   Patient/family considering completion of AD packet. Patient wishes for daughter, Joe [name] to be documented HCPOA. Daughter aware and agrees with his decision.   May benefit from outpatient home palliative referral or f/u with Joe [name], PMT provider at [name].   PMT provider will continue to follow inpatient.   Code Status/Advance Care Planning:  Full code  Symptom Management:   Oncology managing and will follow outpatient. Agree with current regimen.  Palliative Prophylaxis:   Aspiration, Bowel Regimen, Delirium Protocol, Frequent Pain Assessment, Oral Care and Turn Reposition  Additional Recommendations (Limitations, Scope, Preferences):  Full Scope Treatment  Psycho-social/Spiritual:   Desire for further Chaplaincy support: yes  Additional Recommendations: Caregiving  Support/Resources  Prognosis:   Unable to determine  Discharge Planning: Likely home when stable     Primary Diagnoses: Present on Admission: . Abdominal pain . Pancreatic mass . Malignant neoplasm of head of pancreas ([place]) . Nausea and vomiting . Dehydration . Anemia . Leukocytosis . Failure to thrive in adult . Severe protein-calorie malnutrition ([place])   I have reviewed the medical record, interviewed the patient and family, and examined the patient. The following aspects are pertinent.  Past Medical History:  Diagnosis Date  .  Pancreatic cancer ([place])   . Scalp cyst    right posterior   . Wears partial dentures    upper   Social History   Socioeconomic History  . Marital status: Married    Spouse name: Not on file  . Number of children: Not on file  . Years of education: Not on file  . Highest education level: Not on file  Occupational History  . Not on file  Tobacco Use  . Smoking status: Former Smoker    Years: 8.00    Types: Cigarettes    Quit date: 01/07/1976    Years since quitting: 44.2  . Smokeless tobacco: Never Used  Vaping Use  . Vaping Use: Never used  Substance and Sexual Activity  . Alcohol use: Yes    Comment: occasionally  . Drug use: Yes    Types: Marijuana    Comment: occasionally for appetite stimulant  . Sexual activity: Not on file  Other Topics Concern  . Not on file  Social History Narrative  . Not on file   Social Determinants of Health   Financial Resource Strain:   .  Difficulty of Paying Living Expenses:   Food Insecurity:   . Worried About Charity fundraiser in the Last Year:   . Arboriculturist in the Last Year:   Transportation Needs:   . Film/video editor (Medical):   Marland Kitchen Lack of Transportation (Non-Medical):   Physical Activity:   . Days of Exercise per Week:   . Minutes of Exercise per Session:   Stress:   . Feeling of Stress :   Social Connections:   . Frequency of Communication with Friends and Family:   . Frequency of Social Gatherings with Friends and Family:   . Attends Religious Services:   . Active Member of Clubs or Organizations:   . Attends Archivist Meetings:   Marland Kitchen Marital Status:    Family History  Problem Relation Age of Onset  . Pancreatic cancer Father   . Diverticulitis Father    Scheduled Meds: . (feeding supplement) PROSource Plus  30 mL Oral BID BM  . bisacodyl  10 mg Rectal Daily  . Chlorhexidine Gluconate Cloth  6 each Topical Daily  . enoxaparin (LOVENOX) injection  40 mg Subcutaneous Q24H  . feeding  supplement  1 Container Oral TID BM  . feeding supplement (ENSURE ENLIVE)  237 mL Oral TID WC  . guaiFENesin  1,200 mg Oral BID  . lipase/protease/amylase  36,000 Units Oral TID AC  . morphine  30 mg Oral Q12H  . multivitamin with minerals  1 tablet Oral Daily  . polyethylene glycol  17 g Oral Daily  . [START ON 04/02/2020] predniSONE  20 mg Oral Q breakfast  . senna-docusate  1 tablet Oral QHS  . simethicone  160 mg Oral QID   Continuous Infusions: . dextrose 5 % and 0.9% NaCl 100 mL/hr at 04/01/20 0024   PRN Meds:.acetaminophen **OR** acetaminophen, bismuth subsalicylate, calcium carbonate, HYDROmorphone (DILAUDID) injection, LORazepam, metoprolol tartrate, ondansetron **OR** ondansetron (ZOFRAN) IV, oxyCODONE Medications Prior to Admission:  Prior to Admission medications   Medication Sig Start Date End Date Taking? Authorizing Provider  bismuth subsalicylate (PEPTO-BISMOL) 262 MG/15ML suspension Take 30 mLs by mouth daily as needed for diarrhea or loose stools.   Yes [provider]  Ca Carbonate-Mag Hydroxide (ROLAIDS) 550-110 MG CHEW Chew 1 tablet by mouth daily as needed (stomach acid).    Yes [provider]  Calcium Carbonate Antacid (ALKA-SELTZER ANTACID PO) Take 1-2 tablets by mouth as needed (Indigestion).   Yes [provider]  lipase/protease/amylase (CREON) 36000 UNITS CPEP capsule Take 2 capsules (72,000 Units total) by mouth 3 (three) times daily with meals. May also take 1 capsule (36,000 Units total) as needed (with snacks). 03/01/20  Yes Donnamae Jude, MD  morphine (MS CONTIN) 15 MG 12 hr tablet Take 1 tablet (15 mg total) by mouth every 12 (twelve) hours. 03/09/20  Yes Ladell Pier, MD  oxyCODONE (ROXICODONE) 5 MG immediate release tablet Take 1-2 every 4 hours as needed, up to 8 per day Patient taking differently: Take 10 mg by mouth every 4 (four) hours as needed for severe pain.  03/18/20  Yes Owens Shark, NP  senna-docusate (SENOKOT-S)  8.6-50 MG tablet Take 1 tablet by mouth daily. Patient taking differently: Take 1 tablet by mouth 2 (two) times daily.  03/01/20  Yes Donnamae Jude, MD  ondansetron (ZOFRAN ODT) 8 MG disintegrating tablet Take 1 tablet (8 mg total) by mouth every 8 (eight) hours as needed for nausea. '8mg'$  ODT q4 hours prn nausea  Patient not taking: Reported on 03/18/2020 03/07/20   Jacqlyn Larsen, PA-C  promethazine (PHENERGAN) 25 MG tablet Take 1 tablet (25 mg total) by mouth every 6 (six) hours as needed for nausea. Patient not taking: Reported on 03/09/2020 03/01/20   Donnamae Jude, MD  sorbitol 70 % solution Take 30 mLs by mouth 2 (two) times daily. Decrease to daily after having adequate bowel movement Patient not taking: Reported on 03/28/2020 03/09/20   Ladell Pier, MD   Allergies  Allergen Reactions  . Morphine And Related Other (See Comments)    Can't eat   Review of Systems  Constitutional: Positive for activity change, appetite change, fatigue and unexpected weight change.  Gastrointestinal: Positive for abdominal pain.    Physical Exam Vitals and nursing note reviewed.  Constitutional:      General: He is awake.     Appearance: He is cachectic.  HENT:     Head: Normocephalic and atraumatic.  Pulmonary:     Effort: No tachypnea, accessory muscle usage or respiratory distress.  Skin:    General: Skin is warm and dry.  Neurological:     Mental Status: He is alert and oriented to person, place, and time.  Psychiatric:        Mood and Affect: Mood normal.        Speech: Speech normal.        Behavior: Behavior normal.        Cognition and Memory: Cognition normal.    Vital Signs: BP (!) 131/68 (BP Location: Left Arm)   Pulse (!) 123   Temp 98.7 F (37.1 C) (Oral)   Resp 16   Ht '6\' 1"'$  (1.854 m)   Wt 62.8 kg   SpO2 94%   BMI 18.27 kg/m  Pain Scale: Faces POSS *See Group Information*: S-Acceptable,Sleep, easy to arouse Pain Score: 10-Worst pain ever   SpO2: SpO2: 94 % O2  Device:SpO2: 94 % O2 Flow Rate: .   IO: Intake/output summary:   Intake/Output Summary (Last 24 hours) at 04/01/2020 1146 Last data filed at 03/31/2020 1323 Gross per 24 hour  Intake --  Output 500 ml  Net -500 ml    LBM: Last BM Date: 03/31/20 Baseline Weight: Weight: 63.5 kg Most recent weight: Weight: 62.8 kg     Palliative Assessment/Data: PPS 50%     Time In/Out: 1040-1110, 1520-1640 Time Total: 127mn Greater than 50%  of this time was spent counseling and coordinating care related to the above assessment and plan.  Signed by:  MIhor Dow DNP, FNP-C Palliative Medicine Team  Phone: [phone]: [phone]  Please contact Palliative Medicine Team phone at [phone] questions and concerns.  For individual provider: See AShea Evans

## 2020-04-01 NOTE — Progress Notes (Addendum)
IP PROGRESS NOTE  Subjective:   Resting quietly at time my visit.  Nursing reports that he has some difficulty swallowing his oral oxycodone.  They state pain is overall better controlled however with IV Dilaudid compared to IV fentanyl or morphine.  He was started on MiraLAX by surgery yesterday and bowels are moving. Continues to have intermittent nausea but no vomiting.  He has a poor appetite still.  Objective: Vital signs in last 24 hours: Blood pressure (!) 131/68, pulse (!) 123, temperature 98.7 F (37.1 C), temperature source Oral, resp. rate 16, height 6\' 1"  (1.854 m), weight 62.8 kg, SpO2 94 %.  Intake/Output from previous day: 07/22 0701 - 07/23 0700 In: -  Out: 500 [Urine:500]  Physical Exam:  HEENT: No thrush Lungs: Clear bilaterally, no respiratory distress Cardiac: Regular rate and rhythm, tachycardia Abdomen: Mildly distended, no mass, tender in the mid upper abdomen Extremities: No leg edema  Portacath/PICC-without erythema  Lab Results: Recent Labs    03/31/20 1223 04/01/20 0709  WBC 14.9* 15.8*  HGB 9.0* 8.7*  HCT 25.0* 24.4*  PLT 191 178    BMET Recent Labs    03/31/20 0354 04/01/20 0709  NA 136 134*  K 4.1 3.9  CL 104 102  CO2 24 23  GLUCOSE 126* 137*  BUN 12 10  CREATININE 0.36* 0.39*  CALCIUM 7.3* 7.6*    No results found for: CEA1  Studies/Results: DG Abd 1 View  Result Date: 04/01/2020 CLINICAL DATA:  Obstruction EXAM: ABDOMEN - 1 VIEW COMPARISON:  03/31/2020, 03/30/2020 FINDINGS: Gaseous distention of transverse and ascending colon. Abrupt cut off at proximal descending colon again seen. Cannot exclude obstruction at the splenic flexure. Opacity projecting over mid abdomen question retained contrast versus superimposed radiopacity. No small bowel dilatation. Osseous structures unremarkable. Increased attenuation in the flanks suggesting ascites. IMPRESSION: Probable ascites. Abrupt cut off of gas-filled transverse colon at the splenic  flexure, question colonic obstruction versus ileus. Electronically Signed   By: Lavonia Dana M.D.   On: 04/01/2020 09:15   DG Abd 1 View  Result Date: 03/31/2020 CLINICAL DATA:  Diffuse abdominal pain EXAM: ABDOMEN - 1 VIEW COMPARISON:  03/30/2020 FINDINGS: Gaseous distention of transverse colon. Small amount gas in rectum. Small bowel gas pattern normal. No bowel wall thickening. Osseous structures unremarkable. No urinary tract calcification. IMPRESSION: Mild gaseous distention of transverse colon without definite evidence of obstruction or wall thickening. Electronically Signed   By: Lavonia Dana M.D.   On: 03/31/2020 13:57   CT images reviewed Medications: I have reviewed the patient's current medications.  Assessment/Plan: 1. Pancreas cancer-stage IV ? CT abdomen/pelvis 01/22/2020-multiple hypoattenuating/hypoenhancing liver lesions-indeterminate ? CT abdomen/pelvis 02/27/2020-ill-defined pancreas head mass, new and enlarging extensive hepatic metastases, retroperitoneal adenopathy ? MRI abdomen 02/28/2020-mass at the junction and head of the pancreas body, no vascular invasion, multiple hepatic metastases, retroperitoneal adenopathy ? Ultrasound-guided biopsy of a right liver lesion 03/01/2020-poorly differentiated carcinoma with necrosis, cytokeratin 7, cytokeratin 20, and GATA-3 positive ? Cycle 1 gemcitabine/Abraxane 03/25/2020 ? CT abdomen/pelvis 03/28/2020-increased pancreas mass, increased size and number of liver metastases, progressive ascites, stable sclerotic bone lesions at the sacrum, right acetabulum, and proximal right femur, increased periaortic lymphadenopathy 2. Pain secondary to #1 3. Family history of cancer-father with adrenal cancer 4. Constipation-likely secondary to narcotic analgesics 5. Hypercalcemia 03/15/2020. Z[place] 03/16/2020.  6. Admission 03/29/2020 with abdominal pain, nausea, and dehydration   Joe [name] appears stable.  Per nursing, pain overall better controlled.   He is on MS Contin  and receiving IV Dilaudid.  He takes oxycodone for breakthrough pain but has some difficulty swallowing pills so we will switch to OxyFast.  Pain and nausea likely related to the primary pancreatic mass and metastatic disease.  He is now day 8 following cycle 1 of gemcitabine/Abraxane.  It is too early to expect a response to chemotherapy.  He continues have a poor appetite.  May need to consider hospice if performance status does not improve with better pain control.  Recommendations: 1.  Continue MS Contin and Dilaudid for breakthrough pain.  Change oxycodone to OxyFast 10- 20 mg p.o. every 4 hours as needed for pain. 2.  Continue as needed antiemetics 3.  Increase prednisone to 20 mg daily. 4.  Continue IV hydration until appetite improved. 5.  Out of bed as tolerated 6.  Please call oncology as needed over the weekend.  Outpatient follow-up is scheduled at the Cancer center.   LOS: 3 days   [name], NP   04/01/2020, 1:24 PM Joe [name] was interviewed and examined.  I saw him early this morning and again this afternoon.  His wife was present this afternoon.  He appears more comfortable.  He requested a liquid pain medication.  We will increase the prednisone dose as an appetite stimulant.  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  He can be discharged to home if the pain is under control with MS Contin/oxycodone, he is tolerating a diet, and he is having bowel movements.

## 2020-04-01 NOTE — Progress Notes (Signed)
PROGRESS NOTE    LADEN FIELDHOUSE  HOZ:[id number] DOB: 1961/01/02 DOA: 03/28/2020 PCP: Patient, No Pcp Per    Chief Complaint  Patient presents with  . Dehydration    Brief Narrative:  Patient is an unfortunate 59 year old gentleman history of recently diagnosed metastatic prostate cancer status post completion of cycle 1 gemcitabine/Abraxane chemotherapy on 03/25/2020 presented to the ED with worsening abdominal pain, nausea, poor oral intake, noted to be dehydrated.  CT abdomen and pelvis done showed nodules in the lung bases, increased size and number of liver metastases compared to 02/27/2020 CT, increased size of pancreatic mass, increased periaortic adenopathy, prostate enlarged, increased ascites, stable sclerotic bone lesions noted.  Patient placed back on MS Contin twice daily and fentanyl initially.  Oncology consulted and informed of patient's admission.  Pain medications being adjusted for better control.  Palliative care consulted.   Assessment & Plan:   Principal Problem:   Abdominal pain Active Problems:   Pancreatic mass   Nausea and vomiting   Anemia   Leukocytosis   Malignant neoplasm of head of pancreas (HCC)   Dehydration   Hypokalemia   Failure to thrive in adult   Severe protein-calorie malnutrition (HCC)   Intractable nausea and vomiting   Ileus (HCC)  1 intractable abdominal pain/nausea/colonic ileus versus obstruction Secondary to advanced pancreatic head cancer versus concern for ileus versus bowel obstruction secondary to pancreatic cancer..  CT abdomen and pelvis which was done showed increased size and number of liver mets compared to CT from 02/27/2020 as well as increased size of pancreatic mass, increased periaortic adenopathy, increased ascites, stable sclerotic bone lesions noted.  Patient with nausea complaining of feeling gassy.  Patient currently on simethicone 160 mg p.o. 4 times daily. Patient's home dose MS Contin was resumed and dose has been  increased per oncology to 30 mg p.o. twice daily.  Patient received dose of IV morphine however still with significant abdominal pain.  Home dose oral oxycodone resumed and dose increased and changed to a liquid format per oncology.  IV morphine has been discontinued and patient currently on IV Dilaudid every 3 hours as needed for pain.   Patient with abdominal distention, some abdominal tightness and still with abdominal pain today.  Abdominal films ordered with gaseous distention of the transverse colon without definite evidence of obstruction or wall thickening.  Concern for possible ileus versus small bowel obstruction secondary to pancreatic cancer.  Patient seen in consultation by general surgery well recommending conservative treatment at this time as patient a poor candidate.  Patient with no vomiting.  Patient was placed on clears and has been advanced to a full liquid diet today per general surgery.  Patient also on MiraLAX daily.  Keep potassium >4.  Keep magnesium > 2.  General surgery, oncology following and appreciate input and recommendations.  Palliative care consultation pending for goals of care and continued pain management.   2.  Dehydration Secondary to poor oral intake, nausea, concern for possible ileus.  Continue IV fluids.  Was tolerating clears diet has been advanced to full liquid diet.  Follow.  3.  Metastatic pancreatic cancer stage IV Status post cycle 1 gemcitabine/Abraxane chemotherapy 03/25/2020.  Oncology informed of patient's admission and are following.  Per oncology too early to expect a response from chemotherapy.  Palliative care consulted for goals of care pending.  Oncology following..  4.  Constipation Felt secondary to narcotic pain medication.  Patient however with multiple loose stools and refused MiraLAX.  MiraLAX  was initially discontinued but has been resumed by general surgery.  Patient with abdominal distention with no significant improvement with abdominal  pain.  Abdominal films obtained with gaseous distention colon without definite evidence of obstruction or wall thickening.  Patient was on clears and diet has been advanced to a full liquid diet.  Patient was seen in consultation by general surgery with concerns for possible colonic ileus versus partial small bowel obstruction.  General surgery recommending conservative treatment at this time as patient is a poor surgical candidate.  Patient placed on daily MiraLAX.  Follow.  5.  Failure to thrive Likely secondary to problem #3.  Was tolerating clears.  Diet has been advanced to a full liquid diet per general surgery.  Follow.  6.  Severe protein calorie malnutrition Likely secondary to problem #3.  Once abdominal pain improves, nausea improved could place on nutritional supplementation.  Albumin level at 2.0.  Status post IV albumin x1.  Will place on IV albumin every 6 hours x24 hours.  7.  Hypokalemia Repleted.  Potassium at 3.9..  8.  Anemia of chronic disease, POA Likely secondary to metastatic pancreatic cancer.  Patient with no overt bleeding.  Anemia panel with iron level of 11, TIBC of 85, ferritin of 1555, folate of 8.1, vitamin B12 of 2433.  H&H stable at 8.7.  Transfusion threshold hemoglobin < 7.  9.  Leukocytosis Questionable etiology.  Patient afebrile.  Patient with no respiratory symptoms.  Chest x-ray with atelectasis versus infiltrate however doubt if infiltrate.  Leukocytosis fluctuating and slowly trending back up.  Urinalysis negative for any acute UTI.  Will hold off on antibiotics at this time.  Continue hydration with IV fluids.  Follow.   10.  Tachycardia Likely secondary to significant abdominal pain.  EKG done with a sinus tachycardia.  Continue current pain management.  IV fluids.  IV Lopressor as needed.  Supportive care.    DVT prophylaxis: Lovenox Code Status: Full Family Communication: Updated patient and wife at bedside. Disposition:   Status is:  Inpatient    Dispo: The patient is from: Home              Anticipated d/c is to: To be determined.  Likely home.              Anticipated d/c date is: 3 to 4 days.              Patient currently with metastatic pancreatic cancer, with significant abdominal pain, nausea, poor oral intake, dehydrated.  Concern for possible ileus versus partial small bowel obstruction.  Pain regimen being adjusted for better pain control.  Poor oral intake.  Currently not stable for discharge.       Consultants:   Oncology: Dr. [name] 03/30/2020  Palliative care pending  General surgery: Dr. Lucia Gaskins 03/31/2020  Procedures:   Chest x-ray 03/30/2020  Abdominal films 03/30/2020, 03/31/2020  CT abdomen and pelvis 03/28/2020    Antimicrobials:   None   Subjective: Patient sitting up in bed.  Denies shortness of breath.  No chest pain.  Still with abdominal pain.  Some flatus.  Some loose stools.  IV Dilaudid helping with pain.  Some burping however feels burping not as adequate.  Some nausea.  No emesis.  Tolerating clears.  Objective: Vitals:   03/31/20 2127 04/01/20 0011 04/01/20 0414 04/01/20 0519  BP: 124/84 127/84 103/70 (!) 131/68  Pulse: (!) 122 (!) 114 (!) 118 (!) 123  Resp: 18 17 17 16   Temp:  97.6 F (36.4 C) 98.6 F (37 C) 98.3 F (36.8 C) 98.7 F (37.1 C)  TempSrc: Oral Oral Oral Oral  SpO2:  95% 95% 94%  Weight:      Height:        Intake/Output Summary (Last 24 hours) at 04/01/2020 1243 Last data filed at 03/31/2020 1323 Gross per 24 hour  Intake --  Output 500 ml  Net -500 ml   Filed Weights   03/28/20 1405 03/29/20 0537  Weight: 63.5 kg 62.8 kg    Examination:  General exam: Frail.  Cachectic.  Temporal wasting. Respiratory system: Lungs clear to auscultation bilaterally.  No wheezes, no crackles, no rhonchi.  Normal respiratory effort.   Cardiovascular system: Tachycardia.  No JVD, no murmurs rubs or gallops.  1+ bilateral lower extremity edema.    Gastrointestinal system: Abdomen is slightly less distended, tight, hypoactive bowel sounds, diffuse tenderness to palpation.  No rebound.  No guarding.  Central nervous system: Alert and oriented. No focal neurological deficits. Extremities: Symmetric 5 x 5 power. Skin: No rashes, lesions or ulcers Psychiatry: Judgement and insight appear normal. Mood & affect appropriate.     Data Reviewed: I have personally reviewed following labs and imaging studies  CBC: Recent Labs  Lab 03/28/20 1546 03/29/20 0415 03/30/20 0545 03/31/20 1223 04/01/20 0709  WBC 43.5* 37.8* 19.7* 14.9* 15.8*  NEUTROABS 41.4*  --   --  13.5*  --   HGB 9.7* 9.3* 9.1* 9.0* 8.7*  HCT 26.6* 26.1* 25.1* 25.0* 24.4*  MCV 74.5* 75.0* 74.7* 74.6* 73.7*  PLT 608* 422* 332 191 614    Basic Metabolic Panel: Recent Labs  Lab 03/28/20 1546 03/29/20 0415 03/30/20 0545 03/31/20 0354 04/01/20 0709  NA 137 138 134* 136 134*  K 3.8 4.1 3.4* 4.1 3.9  CL 95* 99 102 104 102  CO2 31 28 24 24 23   GLUCOSE 156* 145* 141* 126* 137*  BUN 21* 17 11 12 10   CREATININE 0.41* 0.38* 0.37* 0.36* 0.39*  CALCIUM 8.1* 7.9* 7.1* 7.3* 7.6*  MG 2.3  --  2.2  --  2.0  PHOS 2.2*  --   --   --   --     GFR: Estimated Creatinine Clearance: 89.4 mL/min (A) (by C-G formula based on SCr of 0.39 mg/dL (L)).  Liver Function Tests: Recent Labs  Lab 03/28/20 1546 03/29/20 0415 03/31/20 0354  AST 67* 67* 30  ALT 43 40 28  ALKPHOS 264* 247* 215*  BILITOT 1.3* 0.8 0.9  PROT 6.4* 5.8* 5.3*  ALBUMIN 2.2* 2.0* 2.0*    CBG: No results for input(s): GLUCAP in the last 168 hours.   Recent Results (from the past 240 hour(s))  SARS Coronavirus 2 by RT PCR (hospital order, performed in [facility] hospital lab) Nasopharyngeal Nasopharyngeal Swab     Status: None   Collection Time: 03/28/20  9:00 PM   Specimen: Nasopharyngeal Swab  Result Value Ref Range Status   SARS Coronavirus 2 NEGATIVE NEGATIVE Final    Comment: (NOTE) SARS-CoV-2  target nucleic acids are NOT DETECTED.  The SARS-CoV-2 RNA is generally detectable in upper and lower respiratory specimens during the acute phase of infection. The lowest concentration of SARS-CoV-2 viral copies this assay can detect is 250 copies / mL. A negative result does not preclude SARS-CoV-2 infection and should not be used as the sole basis for treatment or other patient management decisions.  A negative result may occur with improper specimen collection / handling, submission of  specimen other than nasopharyngeal swab, presence of viral mutation(s) within the areas targeted by this assay, and inadequate number of viral copies (<250 copies / mL). A negative result must be combined with clinical observations, patient history, and epidemiological information.  Fact Sheet for Patients:   StrictlyIdeas.no  Fact Sheet for Healthcare Providers: BankingDealers.co.za  This test is not yet approved or  cleared by the [place] FDA and has been authorized for detection and/or diagnosis of SARS-CoV-2 by FDA under an Emergency Use Authorization (EUA).  This EUA will remain in effect (meaning this test can be used) for the duration of the COVID-19 declaration under Section 564(b)(1) of the Act, 21 U.S.C. section 360bbb-3(b)(1), unless the authorization is terminated or revoked sooner.  Performed at [facility], [place] [address] [place], [place] [address]   Culture, Urine     Status: None   Collection Time: 03/30/20 11:50 AM   Specimen: Urine, Catheterized  Result Value Ref Range Status   Specimen Description   Final    URINE, CATHETERIZED Performed at [place] [address], [place], [place] [address]    Special Requests   Final    NONE Performed at [facility], [place] [address] [place], [place] [address]    Culture   Final    NO GROWTH Performed at [facility], [place] [address], [place], [place] [address]    Report Status 03/31/2020 FINAL  Final         Radiology Studies: DG Abd 1 View  Result Date: 04/01/2020 CLINICAL DATA:  Obstruction EXAM: ABDOMEN - 1 VIEW COMPARISON:  03/31/2020, 03/30/2020 FINDINGS: Gaseous distention of transverse and ascending colon. Abrupt cut off at proximal descending colon again seen. Cannot exclude obstruction at the splenic flexure. Opacity projecting over mid abdomen question retained contrast versus superimposed radiopacity. No small bowel dilatation. Osseous structures unremarkable. Increased attenuation in the flanks suggesting ascites. IMPRESSION: Probable ascites. Abrupt cut off of gas-filled transverse colon at the splenic flexure, question colonic obstruction versus ileus. Electronically Signed   By: Lavonia Dana M.D.   On: 04/01/2020 09:15   DG Abd 1 View  Result Date: 03/31/2020 CLINICAL DATA:  Diffuse abdominal pain EXAM: ABDOMEN - 1 VIEW COMPARISON:  03/30/2020 FINDINGS: Gaseous distention of transverse colon. Small amount gas in rectum. Small bowel gas pattern normal. No bowel wall thickening. Osseous structures unremarkable. No urinary tract calcification. IMPRESSION: Mild gaseous distention of transverse colon without definite evidence of obstruction or wall thickening. Electronically Signed   By: Lavonia Dana M.D.   On: 03/31/2020 13:57        Scheduled Meds: . (feeding supplement) PROSource Plus  30 mL Oral BID BM  . bisacodyl  10 mg Rectal Daily  . Chlorhexidine Gluconate Cloth  6 each Topical Daily  . enoxaparin (LOVENOX) injection  40 mg Subcutaneous Q24H  . feeding supplement  1 Container Oral TID BM  . feeding supplement (ENSURE ENLIVE)  237 mL Oral TID WC  . guaiFENesin  1,200 mg Oral BID  . lipase/protease/amylase  36,000 Units Oral TID AC  . morphine  30 mg Oral Q12H  . multivitamin with minerals  1 tablet Oral Daily  . polyethylene glycol  17 g Oral Daily  . [START ON  04/02/2020] predniSONE  20 mg Oral Q breakfast  . senna-docusate  1 tablet Oral QHS  . simethicone  160 mg Oral QID   Continuous Infusions: . albumin human    .  dextrose 5 % and 0.9% NaCl 100 mL/hr at 04/01/20 0024     LOS: 3 days    Time spent: 35 minutes    Irine Seal, MD [facility]   To contact the attending provider between 7A-7P or the covering provider during after hours 7P-7A, please log into the web site [email or web address] and access using universal [place] password for that web site. If you do not have the password, please call the hospital operator.  04/01/2020, 12:43 PM

## 2020-04-01 NOTE — Progress Notes (Addendum)
[facility] Progress Note     Subjective: CC:   NAEO. States he is tolerating clear liquids. Reports he is having "some" flatus. Had a non-bloody liquid BM yesterday. When I ask him about his diet at home he states he is afraid to eat sometimes because it might "come out the wrong way", I asked him if he meant throwing up he said yes.   Objective: Vital signs in last 24 hours: Temp:  [97.6 F (36.4 C)-98.7 F (37.1 C)] 98.7 F (37.1 C) (07/23 0519) Pulse Rate:  [114-123] 123 (07/23 0519) Resp:  [16-22] 16 (07/23 0519) BP: (103-131)/(68-84) 131/68 (07/23 0519) SpO2:  [92 %-96 %] 94 % (07/23 0519) Last BM Date: 03/31/20  Intake/Output from previous day: 07/22 0701 - 07/23 0700 In: -  Out: 500 [Urine:500] Intake/Output this shift: No intake/output data recorded.  PE: Gen:  Resting in bed, NAD, appears chronically ill with diffuse muscle wasting  Card:  Regular rate and rhythm, pedal pulses 2+ BL Pulm:  Normal effort, clear to auscultation bilaterally.  Has port in right upper chest. Abd: firm, mild global tenderness, worse in lower central abdomen, without peritonitis  Skin: warm and dry, no rashes  Psych: A&Ox3   Lab Results:  Recent Labs    03/31/20 1223 04/01/20 0709  WBC 14.9* 15.8*  HGB 9.0* 8.7*  HCT 25.0* 24.4*  PLT 191 178   BMET Recent Labs    03/31/20 0354 04/01/20 0709  NA 136 134*  K 4.1 3.9  CL 104 102  CO2 24 23  GLUCOSE 126* 137*  BUN 12 10  CREATININE 0.36* 0.39*  CALCIUM 7.3* 7.6*   PT/INR No results for input(s): LABPROT, INR in the last 72 hours. CMP     Component Value Date/Time   NA 134 (L) 04/01/2020 0709   K 3.9 04/01/2020 0709   CL 102 04/01/2020 0709   CO2 23 04/01/2020 0709   GLUCOSE 137 (H) 04/01/2020 0709   BUN 10 04/01/2020 0709   CREATININE 0.39 (L) 04/01/2020 0709   CREATININE 0.72 03/18/2020 1154   CALCIUM 7.6 (L) 04/01/2020 0709   PROT 5.3 (L) 03/31/2020 0354   ALBUMIN 2.0 (L) 03/31/2020 0354   AST  30 03/31/2020 0354   AST 59 (H) 03/18/2020 1154   ALT 28 03/31/2020 0354   ALT 43 03/18/2020 1154   ALKPHOS 215 (H) 03/31/2020 0354   BILITOT 0.9 03/31/2020 0354   BILITOT 0.4 03/18/2020 1154   GFRNONAA >60 04/01/2020 0709   GFRNONAA >60 03/18/2020 1154   GFRAA >60 04/01/2020 0709   GFRAA >60 03/18/2020 1154   Lipase     Component Value Date/Time   LIPASE 24 03/07/2020 1417       Studies/Results: DG Chest 2 View  Result Date: 03/30/2020 CLINICAL DATA:  Metastatic pancreatic cancer. EXAM: CHEST - 2 VIEW COMPARISON:  None. FINDINGS: Low volume film with right base atelectasis or infiltrate in small right pleural effusion. Right Port-A-Cath tip overlies the right atrium. The visualized bony structures of the thorax show now acute abnormality. IMPRESSION: Low volume film with right base atelectasis or infiltrate and small right pleural effusion. Electronically Signed   By: Misty Stanley M.D.   On: 03/30/2020 11:59   DG Abd 1 View  Result Date: 04/01/2020 CLINICAL DATA:  Obstruction EXAM: ABDOMEN - 1 VIEW COMPARISON:  03/31/2020, 03/30/2020 FINDINGS: Gaseous distention of transverse and ascending colon. Abrupt cut off at proximal descending colon again seen. Cannot exclude obstruction at the splenic flexure.  Opacity projecting over mid abdomen question retained contrast versus superimposed radiopacity. No small bowel dilatation. Osseous structures unremarkable. Increased attenuation in the flanks suggesting ascites. IMPRESSION: Probable ascites. Abrupt cut off of gas-filled transverse colon at the splenic flexure, question colonic obstruction versus ileus. Electronically Signed   By: Lavonia Dana M.D.   On: 04/01/2020 09:15   DG Abd 1 View  Result Date: 03/31/2020 CLINICAL DATA:  Diffuse abdominal pain EXAM: ABDOMEN - 1 VIEW COMPARISON:  03/30/2020 FINDINGS: Gaseous distention of transverse colon. Small amount gas in rectum. Small bowel gas pattern normal. No bowel wall thickening. Osseous  structures unremarkable. No urinary tract calcification. IMPRESSION: Mild gaseous distention of transverse colon without definite evidence of obstruction or wall thickening. Electronically Signed   By: Lavonia Dana M.D.   On: 03/31/2020 13:57   DG Abd 2 Views  Result Date: 03/30/2020 CLINICAL DATA:  Metastatic pancreatic cancer with recurrent nausea and vomiting. EXAM: ABDOMEN - 2 VIEW COMPARISON:  CT scan 03/28/2020 FINDINGS: Two views study shows no intraperitoneal free air. Gaseous distention of small bowel and colon is evident. Centralization of bowel loops suggests ascites. No worrisome lytic or sclerotic osseous abnormality. IMPRESSION: Gaseous distention of small bowel and colon. Imaging features suggest ileus. No intraperitoneal free air Electronically Signed   By: Misty Stanley M.D.   On: 03/30/2020 11:58    Anti-infectives: Anti-infectives (From admission, onward)   None     Assessment/Plan Metastatic pancreatic cancer Abdominal pain, secondary to above Severe protein calorie malnutrition -albumin is 2.  We will check prealbumin. Lower extremity edema almost certainly related to hypoalbuminemia  Abdominal distention, colonic ileus versus partial colonic obstruction  - having some flatus and loose BMs  - advance to FLD + ensure TID and monitor   - continue Miralax   - if shows signs more consistent with colonic pSBO, may warrant colonoscopy vs BE to identify point of obstruction.   - patient is a high operative risk given metastatic disease and malnutrition, hopefully this can be manageed non-operatively with medication and dietary modifications.    LOS: 3 days   Obie Dredge, [facility] Please see Amion for pager number during day hours 7:00am-4:30pm  Agree with above. Hopefully GI issues can be managed medically.  Alphonsa Overall, MD, [facility] Office phone:  [phone]

## 2020-04-02 ENCOUNTER — Inpatient Hospital Stay (HOSPITAL_COMMUNITY): Payer: BC Managed Care – PPO

## 2020-04-02 DIAGNOSIS — R627 Adult failure to thrive: Secondary | ICD-10-CM | POA: Diagnosis not present

## 2020-04-02 DIAGNOSIS — E860 Dehydration: Secondary | ICD-10-CM | POA: Diagnosis not present

## 2020-04-02 DIAGNOSIS — R1013 Epigastric pain: Secondary | ICD-10-CM | POA: Diagnosis not present

## 2020-04-02 DIAGNOSIS — E876 Hypokalemia: Secondary | ICD-10-CM | POA: Diagnosis not present

## 2020-04-02 LAB — CBC WITH DIFFERENTIAL/PLATELET
Abs Immature Granulocytes: 0.13 10*3/uL — ABNORMAL HIGH (ref 0.00–0.07)
Basophils Absolute: 0 10*3/uL (ref 0.0–0.1)
Basophils Relative: 0 %
Eosinophils Absolute: 0.1 10*3/uL (ref 0.0–0.5)
Eosinophils Relative: 1 %
HCT: 22.6 % — ABNORMAL LOW (ref 39.0–52.0)
Hemoglobin: 8.1 g/dL — ABNORMAL LOW (ref 13.0–17.0)
Immature Granulocytes: 1 %
Lymphocytes Relative: 7 %
Lymphs Abs: 0.8 10*3/uL (ref 0.7–4.0)
MCH: 26.7 pg (ref 26.0–34.0)
MCHC: 35.8 g/dL (ref 30.0–36.0)
MCV: 74.6 fL — ABNORMAL LOW (ref 80.0–100.0)
Monocytes Absolute: 1 10*3/uL (ref 0.1–1.0)
Monocytes Relative: 9 %
Neutro Abs: 9.6 10*3/uL — ABNORMAL HIGH (ref 1.7–7.7)
Neutrophils Relative %: 82 %
Platelets: 175 10*3/uL (ref 150–400)
RBC: 3.03 MIL/uL — ABNORMAL LOW (ref 4.22–5.81)
RDW: 13.5 % (ref 11.5–15.5)
WBC: 11.6 10*3/uL — ABNORMAL HIGH (ref 4.0–10.5)
nRBC: 0.4 % — ABNORMAL HIGH (ref 0.0–0.2)

## 2020-04-02 LAB — RENAL FUNCTION PANEL
Albumin: 2.6 g/dL — ABNORMAL LOW (ref 3.5–5.0)
Anion gap: 8 (ref 5–15)
BUN: 10 mg/dL (ref 6–20)
CO2: 27 mmol/L (ref 22–32)
Calcium: 8.2 mg/dL — ABNORMAL LOW (ref 8.9–10.3)
Chloride: 102 mmol/L (ref 98–111)
Creatinine, Ser: 0.39 mg/dL — ABNORMAL LOW (ref 0.61–1.24)
GFR calc Af Amer: >60 mL/min (ref >60)
GFR calc non Af Amer: >60 mL/min (ref >60)
Glucose, Bld: 116 mg/dL — ABNORMAL HIGH (ref 70–99)
Phosphorus: 2 mg/dL — ABNORMAL LOW (ref 2.5–4.6)
Potassium: 3.7 mmol/L (ref 3.5–5.1)
Sodium: 137 mmol/L (ref 135–145)

## 2020-04-02 LAB — MAGNESIUM: Magnesium: 1.7 mg/dL (ref 1.7–2.4)

## 2020-04-02 MED ORDER — MAGNESIUM SULFATE 4 GM/100ML IV SOLN
4.0000 g | Freq: Once | INTRAVENOUS | Status: AC
  Administered 2020-04-02: 4 g via INTRAVENOUS
  Filled 2020-04-02: qty 100

## 2020-04-02 MED ORDER — POTASSIUM CHLORIDE 10 MEQ/100ML IV SOLN
10.0000 meq | INTRAVENOUS | Status: AC
  Administered 2020-04-02 (×4): 10 meq via INTRAVENOUS
  Filled 2020-04-02 (×3): qty 100

## 2020-04-02 MED ORDER — POTASSIUM PHOSPHATES 15 MMOLE/5ML IV SOLN
30.0000 mmol | Freq: Once | INTRAVENOUS | Status: AC
  Administered 2020-04-02: 30 mmol via INTRAVENOUS
  Filled 2020-04-02: qty 10

## 2020-04-02 MED ORDER — DEXTROSE-NACL 5-0.9 % IV SOLN: INTRAVENOUS | Status: DC

## 2020-04-02 NOTE — Progress Notes (Signed)
[facility] Progress Note     Subjective: CC:   States he is tolerating liquids. Reports he is having flatus and liquid BM's.  Objective: Vital signs in last 24 hours: Temp:  [97.5 F (36.4 C)-98.1 F (36.7 C)] 97.5 F (36.4 C) (07/24 0736) Pulse Rate:  [108-120] 118 (07/24 0736) Resp:  [16-18] 18 (07/24 0736) BP: (126-152)/(74-85) 133/79 (07/24 0736) SpO2:  [91 %-95 %] 94 % (07/24 0736) Last BM Date: 04/01/20  Intake/Output from previous day: No intake/output data recorded. Intake/Output this shift: No intake/output data recorded.  PE: Gen:  Resting in bed, NAD, appears chronically ill with diffuse muscle wasting  Abd: mild global tenderness, worse in lower central abdomen, without peritonitis  Skin: warm and dry, no rashes  Psych: A&Ox3   Lab Results:  Recent Labs    03/31/20 1223 04/01/20 0709  WBC 14.9* 15.8*  HGB 9.0* 8.7*  HCT 25.0* 24.4*  PLT 191 178   BMET Recent Labs    04/01/20 0709 04/02/20 0500  NA 134* 137  K 3.9 3.7  CL 102 102  CO2 23 27  GLUCOSE 137* 116*  BUN 10 10  CREATININE 0.39* 0.39*  CALCIUM 7.6* 8.2*   PT/INR No results for input(s): LABPROT, INR in the last 72 hours. CMP     Component Value Date/Time   NA 137 04/02/2020 0500   K 3.7 04/02/2020 0500   CL 102 04/02/2020 0500   CO2 27 04/02/2020 0500   GLUCOSE 116 (H) 04/02/2020 0500   BUN 10 04/02/2020 0500   CREATININE 0.39 (L) 04/02/2020 0500   CREATININE 0.72 03/18/2020 1154   CALCIUM 8.2 (L) 04/02/2020 0500   PROT 5.3 (L) 03/31/2020 0354   ALBUMIN 2.6 (L) 04/02/2020 0500   AST 30 03/31/2020 0354   AST 59 (H) 03/18/2020 1154   ALT 28 03/31/2020 0354   ALT 43 03/18/2020 1154   ALKPHOS 215 (H) 03/31/2020 0354   BILITOT 0.9 03/31/2020 0354   BILITOT 0.4 03/18/2020 1154   GFRNONAA >60 04/02/2020 0500   GFRNONAA >60 03/18/2020 1154   GFRAA >60 04/02/2020 0500   GFRAA >60 03/18/2020 1154   Lipase     Component Value Date/Time   LIPASE 24 03/07/2020  1417       Studies/Results: DG Abd 1 View  Result Date: 04/01/2020 CLINICAL DATA:  Obstruction EXAM: ABDOMEN - 1 VIEW COMPARISON:  03/31/2020, 03/30/2020 FINDINGS: Gaseous distention of transverse and ascending colon. Abrupt cut off at proximal descending colon again seen. Cannot exclude obstruction at the splenic flexure. Opacity projecting over mid abdomen question retained contrast versus superimposed radiopacity. No small bowel dilatation. Osseous structures unremarkable. Increased attenuation in the flanks suggesting ascites. IMPRESSION: Probable ascites. Abrupt cut off of gas-filled transverse colon at the splenic flexure, question colonic obstruction versus ileus. Electronically Signed   By: Lavonia Dana M.D.   On: 04/01/2020 09:15   DG Abd 1 View  Result Date: 03/31/2020 CLINICAL DATA:  Diffuse abdominal pain EXAM: ABDOMEN - 1 VIEW COMPARISON:  03/30/2020 FINDINGS: Gaseous distention of transverse colon. Small amount gas in rectum. Small bowel gas pattern normal. No bowel wall thickening. Osseous structures unremarkable. No urinary tract calcification. IMPRESSION: Mild gaseous distention of transverse colon without definite evidence of obstruction or wall thickening. Electronically Signed   By: Lavonia Dana M.D.   On: 03/31/2020 13:57    Anti-infectives: Anti-infectives (From admission, onward)   None     Assessment/Plan Metastatic pancreatic cancer Abdominal pain, secondary to above Severe  protein calorie malnutrition -albumin is 2.   prealbumin <5 Lower extremity edema almost certainly related to hypoalbuminemia  Abdominal distention, colonic ileus versus partial colonic obstruction  - having some flatus and loose BMs  - Cont FLD + ensure TID and monitor.  May not be able to tolerate anything more than this   - continue Miralax   - if shows signs more consistent with colonic pSBO, may warrant colonoscopy vs BE to identify point of obstruction.   - patient is a high operative  risk given metastatic disease and malnutrition, hopefully this can be manageed non-operatively with medication and dietary modifications.   -will recheck on Mon   LOS: 4 days   Rosario Adie, MD  [facility]

## 2020-04-02 NOTE — Progress Notes (Signed)
PROGRESS NOTE    Joe [name]  OBS:[id number] DOB: 01/21/1961 DOA: 03/28/2020 PCP: Patient, No Pcp Per    Chief Complaint  Patient presents with  . Dehydration    Brief Narrative:  Patient is an unfortunate 59 year old gentleman history of recently diagnosed metastatic prostate cancer status post completion of cycle 1 gemcitabine/Abraxane chemotherapy on 03/25/2020 presented to the ED with worsening abdominal pain, nausea, poor oral intake, noted to be dehydrated.  CT abdomen and pelvis done showed nodules in the lung bases, increased size and number of liver metastases compared to 02/27/2020 CT, increased size of pancreatic mass, increased periaortic adenopathy, prostate enlarged, increased ascites, stable sclerotic bone lesions noted.  Patient placed back on MS Contin twice daily and fentanyl initially.  Oncology consulted and informed of patient's admission.  Pain medications being adjusted for better control.  Palliative care consulted.   Assessment & Plan:   Principal Problem:   Abdominal pain Active Problems:   Pancreatic mass   Nausea and vomiting   Anemia   Leukocytosis   Malignant neoplasm of head of pancreas (HCC)   Dehydration   Hypokalemia   Failure to thrive in adult   Severe protein-calorie malnutrition (HCC)   Intractable nausea and vomiting   Ileus ([place])   Palliative care by specialist  1 intractable abdominal pain/nausea/colonic ileus versus obstruction Secondary to advanced pancreatic head cancer versus concern for ileus versus bowel obstruction secondary to pancreatic cancer..  CT abdomen and pelvis which was done showed increased size and number of liver mets compared to CT from 02/27/2020 as well as increased size of pancreatic mass, increased periaortic adenopathy, increased ascites, stable sclerotic bone lesions noted.  Patient with nausea complaining of feeling gassy.  Patient currently on simethicone 160 mg p.o. 4 times daily. Patient's home dose MS  Contin was resumed and dose has been increased per oncology to 30 mg p.o. twice daily.  Patient received dose of IV morphine however still with significant abdominal pain.  Home dose oral oxycodone resumed and dose increased and changed to a liquid format per oncology.  IV morphine has been discontinued and patient currently on IV Dilaudid every 3 hours as needed for pain.   Patient with abdominal distention, some abdominal tightness and still with abdominal pain today.  Abdominal films ordered with gaseous distention of the transverse colon without definite evidence of obstruction or wall thickening.  Concern for possible ileus versus small bowel obstruction secondary to pancreatic cancer.  Patient seen in consultation by general surgery well recommending conservative treatment at this time as patient a poor candidate.  Patient with no vomiting.  Patient was placed on clears and has been advanced to a full liquid diet per general surgery.  Patient also on MiraLAX daily.  Keep potassium >4.  Keep magnesium > 2.  General surgery, oncology following and appreciate input and recommendations.  Palliative care consultation pending for goals of care and continued pain management.   2.  Dehydration Secondary to poor oral intake, nausea, concern for possible ileus.  Decrease IV fluids to 50 cc an hour.  Currently on a full liquid diet.  Supportive care.   3.  Metastatic pancreatic cancer stage IV Status post cycle 1 gemcitabine/Abraxane chemotherapy 03/25/2020.  Oncology informed of patient's admission and are following.  Per oncology too early to expect a response from chemotherapy.  Palliative care consulted for goals of care and patient wanting aggressive treatment at this time.  Per oncology.    4.  Constipation Felt secondary  to narcotic pain medication.  Patient however with multiple loose stools and refused MiraLAX.  MiraLAX was initially discontinued but has been resumed by general surgery.  Patient with  abdominal distention with no significant improvement with abdominal pain.  Abdominal films obtained with gaseous distention colon without definite evidence of obstruction or wall thickening.  Patient currently on a full liquid diet which is tolerating.  Patient was seen in consultation by general surgery with concerns for possible colonic ileus versus partial small bowel obstruction.  General surgery recommending conservative treatment at this time as patient is a poor surgical candidate.  Patient placed on daily MiraLAX.  Follow.  5.  Failure to thrive Likely secondary to problem #3.  Currently on full liquid diet which patient is tolerating.  Follow.   6.  Severe protein calorie malnutrition Likely secondary to problem #3.  Once abdominal pain improves, nausea improved could place on nutritional supplementation.  Albumin level at 2.0.  Status post IV albumin x 1 day.  Follow..   7.  Hypokalemia Repleted.  Potassium at 3.7.. Magnesium at 1.7.  We will give magnesium sulfate 4 g IV x1.  Follow.  8.  Anemia of chronic disease, POA Likely secondary to metastatic pancreatic cancer.  Patient with no overt bleeding.  Anemia panel with iron level of 11, TIBC of 85, ferritin of 1555, folate of 8.1, vitamin B12 of 2433.  H&H stable at 8.1.  Transfusion threshold hemoglobin < 7.  9.  Leukocytosis Questionable etiology.  Patient afebrile.  Patient with no respiratory symptoms.  Chest x-ray with atelectasis versus infiltrate however doubt if infiltrate.  Leukocytosis trending down.  Urinalysis negative for any acute UTI.  Hold off on antibiotics.  Follow.   10.  Tachycardia Likely secondary to significant abdominal pain.  EKG done with a sinus tachycardia.  Continue gentle hydration, IV Lopressor as needed, pain management.  Supportive care.     DVT prophylaxis: Lovenox Code Status: Full Family Communication: Updated patient.  No family at bedside.   Disposition:   Status is: Inpatient    Dispo:  The patient is from: Home              Anticipated d/c is to: To be determined.  Likely home.              Anticipated d/c date is: 3 to 4 days.              Patient currently with metastatic pancreatic cancer, with significant abdominal pain, nausea, poor oral intake, dehydrated.  Concern for possible ileus versus partial small bowel obstruction.  Pain regimen being adjusted for better pain control.  Poor oral intake.  Currently not stable for discharge.       Consultants:   Oncology: Dr. [name] 03/30/2020  Palliative care: Ihor Dow, NP 04/01/2020  General surgery: Dr. Lucia Gaskins 03/31/2020  Procedures:   Chest x-ray 03/30/2020  Abdominal films 03/30/2020, 03/31/2020  CT abdomen and pelvis 03/28/2020    Antimicrobials:   None   Subjective: Patient sleeping but arousable.  Does endorse some nausea.  No emesis.  States has been having loose stools.  Still with abdominal pain with no significant change.  Passing flatus.  Tolerating some full liquids.   Objective: Vitals:   04/02/20 0139 04/02/20 0353 04/02/20 0736 04/02/20 1255  BP: 126/77 127/77 (!) 133/79 124/78  Pulse: (!) 118 (!) 120 (!) 118 (!) 117  Resp: 18 16 18 18   Temp: 97.6 F (36.4 C) (!) 97.5 F (36.4  C) (!) 97.5 F (36.4 C) 97.8 F (36.6 C)  TempSrc: Oral Oral Axillary Axillary  SpO2: 91% 95% 94% 96%  Weight:      Height:       No intake or output data in the 24 hours ending 04/02/20 1333 Filed Weights   03/28/20 1405 03/29/20 0537  Weight: 63.5 kg 62.8 kg    Examination:  General exam: Cachectic.  Frail.  Temporal wasting.   Respiratory system: Lungs clear to auscultation bilaterally.  No wheezes, no crackles, no rhonchi.  Normal respiratory effort.   Cardiovascular system: Tachycardic.  No JVD, no murmurs rubs or gallops.  1-2+ bilateral lower extremity edema.   Gastrointestinal system: Abdomen is slightly less distended, slightly less tight, positive bowel sounds, diffuse tenderness to palpation.   No rebound.  No guarding.  Central nervous system: Alert and oriented. No focal neurological deficits. Extremities: Symmetric 5 x 5 power. Skin: No rashes, lesions or ulcers Psychiatry: Judgement and insight appear normal. Mood & affect appropriate.     Data Reviewed: I have personally reviewed following labs and imaging studies  CBC: Recent Labs  Lab 03/28/20 1546 03/28/20 1546 03/29/20 0415 03/30/20 0545 03/31/20 1223 04/01/20 0709 04/02/20 0659  WBC 43.5*   < > 37.8* 19.7* 14.9* 15.8* 11.6*  NEUTROABS 41.4*  --   --   --  13.5*  --  9.6*  HGB 9.7*   < > 9.3* 9.1* 9.0* 8.7* 8.1*  HCT 26.6*   < > 26.1* 25.1* 25.0* 24.4* 22.6*  MCV 74.5*   < > 75.0* 74.7* 74.6* 73.7* 74.6*  PLT 608*   < > 422* 332 191 178 175   < > = values in this interval not displayed.    Basic Metabolic Panel: Recent Labs  Lab 03/28/20 1546 03/28/20 1546 03/29/20 0415 03/30/20 0545 03/31/20 0354 04/01/20 0709 04/02/20 0500 04/02/20 0659  NA 137   < > 138 134* 136 134* 137  --   K 3.8   < > 4.1 3.4* 4.1 3.9 3.7  --   CL 95*   < > 99 102 104 102 102  --   CO2 31   < > 28 24 24 23 27   --   GLUCOSE 156*   < > 145* 141* 126* 137* 116*  --   BUN 21*   < > 17 11 12 10 10   --   CREATININE 0.41*   < > 0.38* 0.37* 0.36* 0.39* 0.39*  --   CALCIUM 8.1*   < > 7.9* 7.1* 7.3* 7.6* 8.2*  --   MG 2.3  --   --  2.2  --  2.0  --  1.7  PHOS 2.2*  --   --   --   --   --  2.0*  --    < > = values in this interval not displayed.    GFR: Estimated Creatinine Clearance: 89.4 mL/min (A) (by C-G formula based on SCr of 0.39 mg/dL (L)).  Liver Function Tests: Recent Labs  Lab 03/28/20 1546 03/29/20 0415 03/31/20 0354 04/02/20 0500  AST 67* 67* 30  --   ALT 43 40 28  --   ALKPHOS 264* 247* 215*  --   BILITOT 1.3* 0.8 0.9  --   PROT 6.4* 5.8* 5.3*  --   ALBUMIN 2.2* 2.0* 2.0* 2.6*    CBG: No results for input(s): GLUCAP in the last 168 hours.   Recent Results (from the past 240 hour(s))  SARS  Coronavirus 2 by RT PCR (hospital order, performed in [facility] hospital lab) Nasopharyngeal Nasopharyngeal Swab     Status: None   Collection Time: 03/28/20  9:00 PM   Specimen: Nasopharyngeal Swab  Result Value Ref Range Status   SARS Coronavirus 2 NEGATIVE NEGATIVE Final    Comment: (NOTE) SARS-CoV-2 target nucleic acids are NOT DETECTED.  The SARS-CoV-2 RNA is generally detectable in upper and lower respiratory specimens during the acute phase of infection. The lowest concentration of SARS-CoV-2 viral copies this assay can detect is 250 copies / mL. A negative result does not preclude SARS-CoV-2 infection and should not be used as the sole basis for treatment or other patient management decisions.  A negative result may occur with improper specimen collection / handling, submission of specimen other than nasopharyngeal swab, presence of viral mutation(s) within the areas targeted by this assay, and inadequate number of viral copies (<250 copies / mL). A negative result must be combined with clinical observations, patient history, and epidemiological information.  Fact Sheet for Patients:   StrictlyIdeas.no  Fact Sheet for Healthcare Providers: BankingDealers.co.za  This test is not yet approved or  cleared by the [place] FDA and has been authorized for detection and/or diagnosis of SARS-CoV-2 by FDA under an Emergency Use Authorization (EUA).  This EUA will remain in effect (meaning this test can be used) for the duration of the COVID-19 declaration under Section 564(b)(1) of the Act, 21 U.S.C. section 360bbb-3(b)(1), unless the authorization is terminated or revoked sooner.  Performed at [facility], [place] [address], [place], [place] [address]   Culture, Urine     Status: None   Collection Time: 03/30/20 11:50 AM   Specimen: Urine, Catheterized  Result Value Ref Range Status   Specimen Description    Final    URINE, CATHETERIZED Performed at [place] [address], [place], [place] [address]    Special Requests   Final    NONE Performed at [facility], [place] [address] [place], [place] [address]    Culture   Final    NO GROWTH Performed at [facility], [place] [address] [place], [place] [address]    Report Status 03/31/2020 FINAL  Final         Radiology Studies: DG Abd 1 View  Result Date: 04/02/2020 CLINICAL DATA:  Follow-up ileus. Upper abdominal pain, nausea and loose stools. EXAM: ABDOMEN - 1 VIEW COMPARISON:  04/01/2020 FINDINGS: Again seen is gaseous distension of the transverse colon. The appearance is similar to the previous exam with abrupt cut off at the level of the proximal descending colon. No dilated small bowel loops No small bowel dilatation. Visualized osseous structures are unremarkable. IMPRESSION: Persistent gaseous distension of the transverse colon with abrupt cut off at the level of the proximal descending colon. Electronically Signed   By: Kerby Moors M.D.   On: 04/02/2020 10:49   DG Abd 1 View  Result Date: 04/01/2020 CLINICAL DATA:  Obstruction EXAM: ABDOMEN - 1 VIEW COMPARISON:  03/31/2020, 03/30/2020 FINDINGS: Gaseous distention of transverse and ascending colon. Abrupt cut off at proximal descending colon again seen. Cannot exclude obstruction at the splenic flexure. Opacity projecting over mid abdomen question retained contrast versus superimposed radiopacity. No small bowel dilatation. Osseous structures unremarkable. Increased attenuation in the flanks suggesting ascites. IMPRESSION: Probable ascites. Abrupt cut off of gas-filled transverse colon at the splenic flexure, question colonic obstruction versus ileus. Electronically Signed   By: Elta Guadeloupe  Thornton Papas M.D.   On: 04/01/2020 09:15        Scheduled Meds: . (feeding supplement) PROSource Plus  30 mL Oral BID BM  . bisacodyl  10 mg Rectal Daily  .  Chlorhexidine Gluconate Cloth  6 each Topical Daily  . enoxaparin (LOVENOX) injection  40 mg Subcutaneous Q24H  . feeding supplement  1 Container Oral TID BM  . feeding supplement (ENSURE ENLIVE)  237 mL Oral TID WC  . guaiFENesin  1,200 mg Oral BID  . lipase/protease/amylase  36,000 Units Oral TID AC  . morphine  30 mg Oral Q12H  . multivitamin with minerals  1 tablet Oral Daily  . polyethylene glycol  17 g Oral Daily  . predniSONE  20 mg Oral Q breakfast  . senna-docusate  1 tablet Oral QHS  . simethicone  160 mg Oral QID   Continuous Infusions: . dextrose 5 % and 0.9% NaCl    . potassium chloride 10 mEq (04/02/20 1239)  . potassium PHOSPHATE IVPB (in mmol)       LOS: 4 days    Time spent: 35 minutes    Irine Seal, MD [facility]   To contact the attending provider between 7A-7P or the covering provider during after hours 7P-7A, please log into the web site [email or web address] and access using universal [place] password for that web site. If you do not have the password, please call the hospital operator.  04/02/2020, 1:33 PM

## 2020-04-03 ENCOUNTER — Other Ambulatory Visit: Payer: Self-pay | Admitting: Oncology

## 2020-04-03 DIAGNOSIS — R627 Adult failure to thrive: Secondary | ICD-10-CM | POA: Diagnosis not present

## 2020-04-03 DIAGNOSIS — Z515 Encounter for palliative care: Secondary | ICD-10-CM

## 2020-04-03 DIAGNOSIS — R109 Unspecified abdominal pain: Secondary | ICD-10-CM | POA: Diagnosis not present

## 2020-04-03 DIAGNOSIS — E43 Unspecified severe protein-calorie malnutrition: Secondary | ICD-10-CM | POA: Diagnosis not present

## 2020-04-03 DIAGNOSIS — Z7189 Other specified counseling: Secondary | ICD-10-CM

## 2020-04-03 DIAGNOSIS — K8689 Other specified diseases of pancreas: Secondary | ICD-10-CM | POA: Diagnosis not present

## 2020-04-03 LAB — RENAL FUNCTION PANEL
Albumin: 2.3 g/dL — ABNORMAL LOW (ref 3.5–5.0)
Anion gap: 7 (ref 5–15)
BUN: 8 mg/dL (ref 6–20)
CO2: 25 mmol/L (ref 22–32)
Calcium: 7.8 mg/dL — ABNORMAL LOW (ref 8.9–10.3)
Chloride: 104 mmol/L (ref 98–111)
Creatinine, Ser: 0.34 mg/dL — ABNORMAL LOW (ref 0.61–1.24)
GFR calc Af Amer: >60 mL/min (ref >60)
GFR calc non Af Amer: >60 mL/min (ref >60)
Glucose, Bld: 158 mg/dL — ABNORMAL HIGH (ref 70–99)
Phosphorus: 2.5 mg/dL (ref 2.5–4.6)
Potassium: 3.8 mmol/L (ref 3.5–5.1)
Sodium: 136 mmol/L (ref 135–145)

## 2020-04-03 LAB — CBC WITH DIFFERENTIAL/PLATELET
Abs Immature Granulocytes: 0.11 10*3/uL — ABNORMAL HIGH (ref 0.00–0.07)
Basophils Absolute: 0 10*3/uL (ref 0.0–0.1)
Basophils Relative: 0 %
Eosinophils Absolute: 0 10*3/uL (ref 0.0–0.5)
Eosinophils Relative: 0 %
HCT: 21.7 % — ABNORMAL LOW (ref 39.0–52.0)
Hemoglobin: 7.7 g/dL — ABNORMAL LOW (ref 13.0–17.0)
Immature Granulocytes: 1 %
Lymphocytes Relative: 7 %
Lymphs Abs: 0.8 10*3/uL (ref 0.7–4.0)
MCH: 26.2 pg (ref 26.0–34.0)
MCHC: 35.5 g/dL (ref 30.0–36.0)
MCV: 73.8 fL — ABNORMAL LOW (ref 80.0–100.0)
Monocytes Absolute: 1.4 10*3/uL — ABNORMAL HIGH (ref 0.1–1.0)
Monocytes Relative: 12 %
Neutro Abs: 8.9 10*3/uL — ABNORMAL HIGH (ref 1.7–7.7)
Neutrophils Relative %: 80 %
Platelets: 241 10*3/uL (ref 150–400)
RBC: 2.94 MIL/uL — ABNORMAL LOW (ref 4.22–5.81)
RDW: 13.8 % (ref 11.5–15.5)
WBC: 11.2 10*3/uL — ABNORMAL HIGH (ref 4.0–10.5)
nRBC: 0.2 % (ref 0.0–0.2)

## 2020-04-03 LAB — MAGNESIUM: Magnesium: 1.9 mg/dL (ref 1.7–2.4)

## 2020-04-03 MED ORDER — HYDROMORPHONE HCL 1 MG/ML IJ SOLN
1.0000 mg | INTRAMUSCULAR | Status: DC | PRN
  Administered 2020-04-03 – 2020-04-07 (×9): 1 mg via INTRAVENOUS
  Filled 2020-04-03 (×9): qty 1

## 2020-04-03 MED ORDER — POTASSIUM CHLORIDE 10 MEQ/100ML IV SOLN
10.0000 meq | INTRAVENOUS | Status: AC
  Administered 2020-04-03 – 2020-04-04 (×3): 10 meq via INTRAVENOUS
  Filled 2020-04-03: qty 100

## 2020-04-03 MED ORDER — MORPHINE SULFATE ER 30 MG PO TBCR
30.0000 mg | EXTENDED_RELEASE_TABLET | Freq: Three times a day (TID) | ORAL | Status: DC
  Administered 2020-04-03 – 2020-04-07 (×11): 30 mg via ORAL
  Filled 2020-04-03 (×11): qty 1

## 2020-04-03 MED ORDER — MAGNESIUM SULFATE 2 GM/50ML IV SOLN
2.0000 g | Freq: Once | INTRAVENOUS | Status: AC
  Administered 2020-04-03: 2 g via INTRAVENOUS
  Filled 2020-04-03: qty 50

## 2020-04-03 MED ORDER — FUROSEMIDE 10 MG/ML IJ SOLN: 20.0000 mg | Freq: Once | INTRAMUSCULAR | Status: DC

## 2020-04-03 MED ORDER — HYDROMORPHONE HCL 1 MG/ML PO LIQD
1.5000 mg | ORAL | Status: DC | PRN
  Administered 2020-04-03 – 2020-04-06 (×8): 1.5 mg via ORAL
  Filled 2020-04-03 (×8): qty 2

## 2020-04-03 NOTE — Progress Notes (Signed)
Daily Progress Note   Patient Name: Joe [name]       Date: 04/03/2020 DOB: [birth year]  Age: 59 y.o. MRN#: [id number] Attending Physician: Eugenie Filler, MD Primary Care Physician: Patient, No Pcp Per Admit Date: 03/28/2020  Reason for Consultation/Follow-up: Establishing goals of care and Pain control  Subjective/GOC: Patient awake, alert, oriented and in good spirits this morning. NT at bedside getting vitals and RN at bedside giving morning medications.   Patient continues to have constant, cancer related pain but shares that he tries not to dwell on this. He has not tried prn oxycodone and continues to require IV dilaudid intermittently. He is having loose stools and tolerating liquids. He is confident his symptoms will be better controlled so he can continue with outpatient chemotherapy and allow time to see if his body responds to chemotherapy.   Discussed symptom management medications. Patient agrees with medication changes. Discussed with RN.   Emotional/spiritual support provided. Reassured of ongoing support from PMT.    Length of Stay: 5  Current Medications: Scheduled Meds:  . (feeding supplement) PROSource Plus  30 mL Oral BID BM  . bisacodyl  10 mg Rectal Daily  . Chlorhexidine Gluconate Cloth  6 each Topical Daily  . enoxaparin (LOVENOX) injection  40 mg Subcutaneous Q24H  . feeding supplement  1 Container Oral TID BM  . feeding supplement (ENSURE ENLIVE)  237 mL Oral TID WC  . guaiFENesin  1,200 mg Oral BID  . lipase/protease/amylase  36,000 Units Oral TID AC  . morphine  30 mg Oral Q8H  . multivitamin with minerals  1 tablet Oral Daily  . polyethylene glycol  17 g Oral Daily  . predniSONE  20 mg Oral Q breakfast  . senna-docusate  1 tablet Oral QHS  .  simethicone  160 mg Oral QID    Continuous Infusions: . dextrose 5 % and 0.9% NaCl 50 mL/hr at 04/02/20 1355    PRN Meds: acetaminophen **OR** acetaminophen, bismuth subsalicylate, calcium carbonate, HYDROmorphone (DILAUDID) injection, HYDROmorphone HCl, LORazepam, metoprolol tartrate, ondansetron **OR** ondansetron (ZOFRAN) IV  Physical Exam Vitals and nursing note reviewed.  Constitutional:      General: He is awake.     Appearance: He is cachectic. He is ill-appearing.  HENT:     Head: Normocephalic and atraumatic.  Pulmonary:  Effort: No tachypnea, accessory muscle usage or respiratory distress.  Skin:    General: Skin is warm and dry.  Neurological:     Mental Status: He is alert and oriented to person, place, and time.            Vital Signs: BP (!) 144/85 (BP Location: Left Arm)   Pulse (!) 113   Temp 98.6 F (37 C) (Oral)   Resp 20   Ht 6\' 1"  (1.854 m)   Wt 62.8 kg   SpO2 96%   BMI 18.27 kg/m  SpO2: SpO2: 96 % O2 Device: O2 Device: Room Air O2 Flow Rate:    Intake/output summary: No intake or output data in the 24 hours ending 04/03/20 1023 LBM: Last BM Date: 04/02/20 Baseline Weight: Weight: 63.5 kg Most recent weight: Weight: 62.8 kg       Palliative Assessment/Data: PPS 50%      Patient Active Problem List   Diagnosis Date Noted  . Palliative care by specialist   . Ileus ([place])   . Hypokalemia 03/30/2020  . Failure to thrive in adult 03/30/2020  . Severe protein-calorie malnutrition ([place]) 03/30/2020  . Intractable nausea and vomiting   . Dehydration 03/29/2020  . Goals of care, counseling/discussion 03/09/2020  . Malignant neoplasm of head of pancreas ([place]) 03/09/2020  . Pancreatic cancer ([place]) 02/29/2020  . Leukocytosis 02/29/2020  . Abdominal pain 02/28/2020  . Pancreatic mass 02/28/2020  . Weight loss 02/28/2020  . Liver lesion 02/28/2020  . Nausea and vomiting 02/28/2020  . Hyponatremia 02/28/2020  . Anemia 02/28/2020     Palliative Care Assessment & Plan   Patient Profile: 59 y.o. male  with past medical history of recent diagnosis of metastatic pancreatic cancer s/p completion of cycle 1 gemcitabine/Abrazane chemo admitted on 03/28/2020 with abdominal pain, nausea, poor oral intake. CT abdomen and pelvis done showed nodules in the lung bases, increased size and number of liver metastases compared to 02/27/2020 CT, increased size of pancreatic mass, increased periaortic adenopathy, prostate enlarged, increased ascites, stable sclerotic bone lesions noted. Also concern for ileus versus bowel obstruction secondary to pancreatic cancer. Surgery following and recommending conservative management as patient is a poor surgical candidate. Tolerating full liquid diet. Palliative medicine consultation for goals of care.   Assessment: Intractable abdominal pain/nausea/vomiting Ileus vs. Obstruction Metastatic pancreatic cancer, stage IV Dehydration Constipation Severe protein calorie malnutrition Failure to thrive  Recommendations/Plan:  Initial GOC discussion with patient and wife 04/01/20. See note.   Patient desires ongoing FULL code/FULL scope treatment. A man of strong Christian faith. He believes God is in control, not science.   Continue current plan of care and medical management.  Patient/wife hopeful for improvement in his acute condition and symptoms.   Continue outpatient oncology follow-up. Patient/wife hopeful he will be a candidate for further chemotherapy.   Patient/family considering completion of AD packet. Patient wishes for daughter, Sherryl Barters to be documented HCPOA. Daughter aware and agrees with his decision.   May benefit from outpatient home palliative referral or f/u with Wadie Lessen, PMT provider at [facility].   Symptom management  Increase MS Contin 30mg  PO q8h scheduled for pain  Dilaudid 1.5mg  liquid PO q4h prn moderate pain  Continue IV Dilaudid prn  breakthrough pain/severe pain  Bowel regimen per surgery  Continue prn IV ativan for anxiety  Continue scheduled prednisone for appetite   Goals of Care and Additional Recommendations:  Limitations on Scope of Treatment: Full Scope Treatment  Code Status:  FULL   Code Status Orders  (From admission, onward)         Start     Ordered   03/29/20 0047  Full code  Continuous        03/29/20 0046        Code Status History    Date Active Date Inactive Code Status Order ID Comments User Context   02/29/2020 1614 03/01/2020 2201 Full Code [id number]  Norval Morton, MD ED   02/28/2020 0104 02/28/2020 2338 Full Code [id number]  Arlan Organ, DO ED   Advance Care Planning Activity       Prognosis:   Unable to determine: guarded, poor long-term  Discharge Planning:  To Be Determined  Care plan was discussed with patient, RN, Loistine Chance PMT MD  Thank you for allowing the Palliative Medicine Team to assist in the care of this patient.   Total Time 25 Prolonged Time Billed  no      Greater than 50%  of this time was spent counseling and coordinating care related to the above assessment and plan.  Ihor Dow, DNP, FNP-C Palliative Medicine Team  Phone: [phone] Fax: [phone]  Please contact Palliative Medicine Team phone at [phone] for questions and concerns.

## 2020-04-03 NOTE — Progress Notes (Signed)
PROGRESS NOTE    Joe [name]  WNU:[id number] DOB: [birth year] DOA: 03/28/2020 PCP: Patient, No Pcp Per    Chief Complaint  Patient presents with  . Dehydration    Brief Narrative:  Patient is an unfortunate 59 year old gentleman history of recently diagnosed metastatic prostate cancer status post completion of cycle 1 gemcitabine/Abraxane chemotherapy on 03/25/2020 presented to the ED with worsening abdominal pain, nausea, poor oral intake, noted to be dehydrated.  CT abdomen and pelvis done showed nodules in the lung bases, increased size and number of liver metastases compared to 02/27/2020 CT, increased size of pancreatic mass, increased periaortic adenopathy, prostate enlarged, increased ascites, stable sclerotic bone lesions noted.  Patient placed back on MS Contin twice daily and fentanyl initially.  Oncology consulted and informed of patient's admission.  Pain medications being adjusted for better control.  Palliative care consulted.   Assessment & Plan:   Principal Problem:   Abdominal pain Active Problems:   Pancreatic mass   Nausea and vomiting   Anemia   Leukocytosis   Malignant neoplasm of head of pancreas (HCC)   Dehydration   Hypokalemia   Failure to thrive in adult   Severe protein-calorie malnutrition (HCC)   Intractable nausea and vomiting   Ileus ([place])   Palliative care by specialist  1 intractable abdominal pain/nausea/colonic ileus versus obstruction Secondary to advanced pancreatic head cancer versus concern for ileus versus bowel obstruction secondary to pancreatic cancer..  CT abdomen and pelvis which was done showed increased size and number of liver mets compared to CT from 02/27/2020 as well as increased size of pancreatic mass, increased periaortic adenopathy, increased ascites, stable sclerotic bone lesions noted.  Patient with nausea complaining of feeling gassy.  Patient currently on simethicone 160 mg p.o. 4 times daily. Patient's home dose MS  Contin was resumed and dose has been increased per oncology to 30 mg p.o. twice daily.  Patient received dose of IV morphine however still with significant abdominal pain.  Home dose oral oxycodone resumed and dose increased and changed to a liquid format per oncology.  IV morphine has been discontinued and patient currently on IV Dilaudid every 3 hours as needed for pain.   Patient with abdominal distention which is slightly improved, some abdominal tightness slightly improved, and still with abdominal pain.  Abdominal films ordered with gaseous distention of the transverse colon without definite evidence of obstruction or wall thickening.  Concern for possible ileus versus small bowel obstruction secondary to pancreatic cancer.  Patient seen in consultation by general surgery well recommending conservative treatment at this time as patient a poor candidate.  Patient with no vomiting.  Patient was placed on clears and has been advanced to a full liquid diet per general surgery.  Patient also on MiraLAX daily.  Keep potassium >4.  Keep magnesium > 2.  General surgery, oncology following and appreciate input and recommendations.  Palliative care consultation pending for goals of care and continued pain management.   2.  Dehydration Secondary to poor oral intake, nausea, concern for possible ileus.  Decreased IV fluids to 50 cc an hour.  Tolerating full liquid diet.  Supportive care.   3.  Metastatic pancreatic cancer stage IV Status post cycle 1 gemcitabine/Abraxane chemotherapy 03/25/2020.  Oncology informed of patient's admission and are following.  Per oncology too early to expect a response from chemotherapy.  Palliative care consulted for goals of care and patient wanting aggressive treatment at this time.  Palliative care helping with pain management.  Per oncology.    4.  Constipation Felt secondary to narcotic pain medication.  Patient however with multiple loose stools and refused MiraLAX initially.   MiraLAX was initially discontinued but has been resumed by general surgery.  Patient with abdominal distention which is slightly improved still with abdominal pain.  Abdominal films obtained with gaseous distention colon without definite evidence of obstruction or wall thickening.  Patient currently on a full liquid diet which is tolerating.  Patient was seen in consultation by general surgery with concerns for possible colonic ileus versus partial small bowel obstruction.  General surgery recommending conservative treatment at this time as patient is a poor surgical candidate.  Patient having bowel movements.  Continue daily MiraLAX.  Follow.    5.  Failure to thrive Likely secondary to problem #3.  Tolerating full liquid diet.  Follow.   6.  Severe protein calorie malnutrition Likely secondary to problem #3.  Once abdominal pain improves, nausea improved could place on nutritional supplementation.  Albumin level at 2.3.  Status post IV albumin x 1 day.  Follow..   7.  Hypokalemia Repleted.  Potassium at 3.8.. Magnesium at 1.9.  Follow.   8.  Anemia of chronic disease, POA Likely secondary to metastatic pancreatic cancer.  Patient with no overt bleeding.  Anemia panel with iron level of 11, TIBC of 85, ferritin of 1555, folate of 8.1, vitamin B12 of 2433.  H&H stable at 7.7.  Transfusion threshold hemoglobin < 7.  9.  Leukocytosis Questionable etiology.  Patient afebrile.  Patient with no respiratory symptoms.  Chest x-ray with atelectasis versus infiltrate however doubt if infiltrate.  Leukocytosis trending down.  Urinalysis negative for any acute UTI.  No need for antibiotics at this time.  Follow.   10.  Tachycardia Secondary to abdominal pain.  EKG with a sinus tachycardia.  Improving.  Gentle hydration.  IV Lopressor as needed.  Pain management.  Supportive care.     DVT prophylaxis: Lovenox Code Status: Full Family Communication: Updated patient.  No family at bedside.   Disposition:     Status is: Inpatient    Dispo: The patient is from: Home              Anticipated d/c is to: To be determined.  Likely home.              Anticipated d/c date is: 2 to 3 days days.              Patient currently with metastatic pancreatic cancer, with significant abdominal pain, nausea, poor oral intake, dehydrated.  Concern for possible ileus versus partial small bowel obstruction.  Pain regimen being adjusted for better pain control.  Poor oral intake.  Currently not stable for discharge.       Consultants:   Oncology: Dr. [name] 03/30/2020  Palliative care: Ihor Dow, NP 04/01/2020  General surgery: Dr. Lucia Gaskins 03/31/2020  Procedures:   Chest x-ray 03/30/2020  Abdominal films 03/30/2020, 03/31/2020  CT abdomen and pelvis 03/28/2020    Antimicrobials:   None   Subjective: Patient laying in bed trying to eat some full liquids.  Patient seems a little more energetic today.  Feels pain is a little bit better controlled.  Having bowel movements.  No emesis.  Stated he ate a small bite of chicken sandwich/[employer] yesterday without worsening abdominal pain or emesis and able to keep it down.  Passing flatus.  Tolerating full liquids.   Objective: Vitals:   04/02/20 2149 04/03/20 0210 04/03/20  0506 04/03/20 1006  BP: 125/84 (!) 134/97 (!) 141/91 (!) 144/85  Pulse: (!) 106 (!) 109 (!) 110 (!) 113  Resp: 22 18 18 20   Temp: 98.4 F (36.9 C) 98.3 F (36.8 C) 98.6 F (37 C)   TempSrc: Oral Oral Oral   SpO2: 93% 95% 94% 96%  Weight:      Height:        Intake/Output Summary (Last 24 hours) at 04/03/2020 1247 Last data filed at 04/03/2020 1131 Gross per 24 hour  Intake --  Output 300 ml  Net -300 ml   Filed Weights   03/28/20 1405 03/29/20 0537  Weight: 63.5 kg 62.8 kg    Examination:  General exam: Frail.  Cachectic.  Temporal wasting.  Respiratory system: CTAB.  No wheezes, no crackles, no rhonchi.  Normal respiratory effort.  Cardiovascular system:  Tachycardia.  No JVD.  No murmurs rubs or gallops.  2+ bilateral lower extremity edema.  Gastrointestinal system: Abdomen is slightly less tight, slightly less distended, positive bowel sounds.  Diffuse tenderness to palpation.  No rebound.  No guarding.  Central nervous system: Alert and oriented. No focal neurological deficits. Extremities: Symmetric 5 x 5 power. Skin: No rashes, lesions or ulcers Psychiatry: Judgement and insight appear normal. Mood & affect appropriate.     Data Reviewed: I have personally reviewed following labs and imaging studies  CBC: Recent Labs  Lab 03/28/20 1546 03/29/20 0415 03/30/20 0545 03/31/20 1223 04/01/20 0709 04/02/20 0659 04/03/20 0942  WBC 43.5*   < > 19.7* 14.9* 15.8* 11.6* 11.2*  NEUTROABS 41.4*  --   --  13.5*  --  9.6* 8.9*  HGB 9.7*   < > 9.1* 9.0* 8.7* 8.1* 7.7*  HCT 26.6*   < > 25.1* 25.0* 24.4* 22.6* 21.7*  MCV 74.5*   < > 74.7* 74.6* 73.7* 74.6* 73.8*  PLT 608*   < > 332 191 178 175 241   < > = values in this interval not displayed.    Basic Metabolic Panel: Recent Labs  Lab 03/28/20 1546 03/29/20 0415 03/30/20 0545 03/31/20 0354 04/01/20 0709 04/02/20 0500 04/02/20 0659 04/03/20 0703 04/03/20 0942  NA 137   < > 134* 136 134* 137  --   --  136  K 3.8   < > 3.4* 4.1 3.9 3.7  --   --  3.8  CL 95*   < > 102 104 102 102  --   --  104  CO2 31   < > 24 24 23 27   --   --  25  GLUCOSE 156*   < > 141* 126* 137* 116*  --   --  158*  BUN 21*   < > 11 12 10 10   --   --  8  CREATININE 0.41*   < > 0.37* 0.36* 0.39* 0.39*  --   --  0.34*  CALCIUM 8.1*   < > 7.1* 7.3* 7.6* 8.2*  --   --  7.8*  MG 2.3  --  2.2  --  2.0  --  1.7 1.9  --   PHOS 2.2*  --   --   --   --  2.0*  --   --  2.5   < > = values in this interval not displayed.    GFR: Estimated Creatinine Clearance: 89.4 mL/min (A) (by C-G formula based on SCr of 0.34 mg/dL (L)).  Liver Function Tests: Recent Labs  Lab 03/28/20 1546 03/29/20 0415 03/31/20  0354  04/02/20 0500 04/03/20 0942  AST 67* 67* 30  --   --   ALT 43 40 28  --   --   ALKPHOS 264* 247* 215*  --   --   BILITOT 1.3* 0.8 0.9  --   --   PROT 6.4* 5.8* 5.3*  --   --   ALBUMIN 2.2* 2.0* 2.0* 2.6* 2.3*    CBG: No results for input(s): GLUCAP in the last 168 hours.   Recent Results (from the past 240 hour(s))  SARS Coronavirus 2 by RT PCR (hospital order, performed in [facility] hospital lab) Nasopharyngeal Nasopharyngeal Swab     Status: None   Collection Time: 03/28/20  9:00 PM   Specimen: Nasopharyngeal Swab  Result Value Ref Range Status   SARS Coronavirus 2 NEGATIVE NEGATIVE Final    Comment: (NOTE) SARS-CoV-2 target nucleic acids are NOT DETECTED.  The SARS-CoV-2 RNA is generally detectable in upper and lower respiratory specimens during the acute phase of infection. The lowest concentration of SARS-CoV-2 viral copies this assay can detect is 250 copies / mL. A negative result does not preclude SARS-CoV-2 infection and should not be used as the sole basis for treatment or other patient management decisions.  A negative result may occur with improper specimen collection / handling, submission of specimen other than nasopharyngeal swab, presence of viral mutation(s) within the areas targeted by this assay, and inadequate number of viral copies (<250 copies / mL). A negative result must be combined with clinical observations, patient history, and epidemiological information.  Fact Sheet for Patients:   StrictlyIdeas.no  Fact Sheet for Healthcare Providers: BankingDealers.co.za  This test is not yet approved or  cleared by the [place] FDA and has been authorized for detection and/or diagnosis of SARS-CoV-2 by FDA under an Emergency Use Authorization (EUA).  This EUA will remain in effect (meaning this test can be used) for the duration of the COVID-19 declaration under Section 564(b)(1) of the Act, 21  U.S.C. section 360bbb-3(b)(1), unless the authorization is terminated or revoked sooner.  Performed at [facility], [place] [address] [place], [place] [address]   Culture, Urine     Status: None   Collection Time: 03/30/20 11:50 AM   Specimen: Urine, Catheterized  Result Value Ref Range Status   Specimen Description   Final    URINE, CATHETERIZED Performed at [place] [address], [place], [place] [address]    Special Requests   Final    NONE Performed at [facility], [place] [address], [place], [place] [address]    Culture   Final    NO GROWTH Performed at [facility], [place] [address] [place], [place] [address]    Report Status 03/31/2020 FINAL  Final         Radiology Studies: DG Abd 1 View  Result Date: 04/02/2020 CLINICAL DATA:  Follow-up ileus. Upper abdominal pain, nausea and loose stools. EXAM: ABDOMEN - 1 VIEW COMPARISON:  04/01/2020 FINDINGS: Again seen is gaseous distension of the transverse colon. The appearance is similar to the previous exam with abrupt cut off at the level of the proximal descending colon. No dilated small bowel loops No small bowel dilatation. Visualized osseous structures are unremarkable. IMPRESSION: Persistent gaseous distension of the transverse colon with abrupt cut off at the level of the proximal descending colon. Electronically Signed   By: Kerby Moors M.D.   On: 04/02/2020 10:49  Scheduled Meds: . (feeding supplement) PROSource Plus  30 mL Oral BID BM  . bisacodyl  10 mg Rectal Daily  . Chlorhexidine Gluconate Cloth  6 each Topical Daily  . enoxaparin (LOVENOX) injection  40 mg Subcutaneous Q24H  . feeding supplement  1 Container Oral TID BM  . feeding supplement (ENSURE ENLIVE)  237 mL Oral TID WC  . guaiFENesin  1,200 mg Oral BID  . lipase/protease/amylase  36,000 Units Oral TID AC  . morphine  30 mg Oral Q8H  . multivitamin with minerals  1 tablet  Oral Daily  . polyethylene glycol  17 g Oral Daily  . predniSONE  20 mg Oral Q breakfast  . senna-docusate  1 tablet Oral QHS  . simethicone  160 mg Oral QID   Continuous Infusions: . dextrose 5 % and 0.9% NaCl 50 mL/hr at 04/02/20 1355     LOS: 5 days    Time spent: 35 minutes    Irine Seal, MD [facility]   To contact the attending provider between 7A-7P or the covering provider during after hours 7P-7A, please log into the web site [email or web address] and access using universal [place] password for that web site. If you do not have the password, please call the hospital operator.  04/03/2020, 12:47 PM

## 2020-04-04 ENCOUNTER — Inpatient Hospital Stay: Payer: BC Managed Care – PPO

## 2020-04-04 ENCOUNTER — Inpatient Hospital Stay: Payer: BC Managed Care – PPO | Admitting: Genetic Counselor

## 2020-04-04 DIAGNOSIS — E876 Hypokalemia: Secondary | ICD-10-CM | POA: Diagnosis not present

## 2020-04-04 DIAGNOSIS — R1013 Epigastric pain: Secondary | ICD-10-CM | POA: Diagnosis not present

## 2020-04-04 DIAGNOSIS — R627 Adult failure to thrive: Secondary | ICD-10-CM | POA: Diagnosis not present

## 2020-04-04 DIAGNOSIS — E860 Dehydration: Secondary | ICD-10-CM | POA: Diagnosis not present

## 2020-04-04 LAB — RENAL FUNCTION PANEL
Albumin: 2.4 g/dL — ABNORMAL LOW (ref 3.5–5.0)
Anion gap: 6 (ref 5–15)
BUN: 7 mg/dL (ref 6–20)
CO2: 27 mmol/L (ref 22–32)
Calcium: 8.4 mg/dL — ABNORMAL LOW (ref 8.9–10.3)
Chloride: 99 mmol/L (ref 98–111)
Creatinine, Ser: 0.35 mg/dL — ABNORMAL LOW (ref 0.61–1.24)
GFR calc Af Amer: >60 mL/min (ref >60)
GFR calc non Af Amer: >60 mL/min (ref >60)
Glucose, Bld: 120 mg/dL — ABNORMAL HIGH (ref 70–99)
Phosphorus: 2.5 mg/dL (ref 2.5–4.6)
Potassium: 4.1 mmol/L (ref 3.5–5.1)
Sodium: 132 mmol/L — ABNORMAL LOW (ref 135–145)

## 2020-04-04 LAB — CBC
HCT: 22.1 % — ABNORMAL LOW (ref 39.0–52.0)
Hemoglobin: 8.1 g/dL — ABNORMAL LOW (ref 13.0–17.0)
MCH: 26.9 pg (ref 26.0–34.0)
MCHC: 36.7 g/dL — ABNORMAL HIGH (ref 30.0–36.0)
MCV: 73.4 fL — ABNORMAL LOW (ref 80.0–100.0)
Platelets: 327 10*3/uL (ref 150–400)
RBC: 3.01 MIL/uL — ABNORMAL LOW (ref 4.22–5.81)
RDW: 13.8 % (ref 11.5–15.5)
WBC: 10.5 10*3/uL (ref 4.0–10.5)
nRBC: 0 % (ref 0.0–0.2)

## 2020-04-04 LAB — MAGNESIUM: Magnesium: 1.8 mg/dL (ref 1.7–2.4)

## 2020-04-04 MED ORDER — FLUCONAZOLE 100 MG PO TABS
100.0000 mg | ORAL_TABLET | Freq: Every day | ORAL | Status: DC
  Administered 2020-04-04 – 2020-04-07 (×4): 100 mg via ORAL
  Filled 2020-04-04 (×4): qty 1

## 2020-04-04 MED ORDER — FUROSEMIDE 10 MG/ML IJ SOLN
20.0000 mg | Freq: Once | INTRAMUSCULAR | Status: AC
  Administered 2020-04-05: 20 mg via INTRAVENOUS
  Filled 2020-04-04: qty 2

## 2020-04-04 NOTE — Progress Notes (Addendum)
IP PROGRESS NOTE  Subjective:  Pain overall much better controlled today.  Bowels moving.  No nausea or vomiting.  Tolerating full liquid diet  Objective: Vital signs in last 24 hours: Blood pressure (!) 132/83, pulse (!) 109, temperature 98.2 F (36.8 C), temperature source Oral, resp. rate 16, height 6\' 1"  (1.854 m), weight 62.8 kg, SpO2 94 %.  Intake/Output from previous day: 07/25 0701 - 07/26 0700 In: -  Out: 300 [Urine:300]  Physical Exam:  HEENT: Thrush noted to tongue Lungs: Clear bilaterally, no respiratory distress Cardiac: Regular rate and rhythm, tachycardia Abdomen: Mildly distended, no mass, tender in the mid upper abdomen Extremities: No leg edema  Portacath/PICC-without erythema  Lab Results: Recent Labs    04/03/20 0942 04/04/20 0839  WBC 11.2* 10.5  HGB 7.7* 8.1*  HCT 21.7* 22.1*  PLT 241 327    BMET Recent Labs    04/03/20 0942 04/04/20 0541  NA 136 132*  K 3.8 4.1  CL 104 99  CO2 25 27  GLUCOSE 158* 120*  BUN 8 7  CREATININE 0.34* 0.35*  CALCIUM 7.8* 8.4*    No results found for: CEA1  Studies/Results: No results found. CT images reviewed Medications: I have reviewed the patient's current medications.  Assessment/Plan: 1. Pancreas cancer-stage IV ? CT abdomen/pelvis 01/22/2020-multiple hypoattenuating/hypoenhancing liver lesions-indeterminate ? CT abdomen/pelvis 02/27/2020-ill-defined pancreas head mass, new and enlarging extensive hepatic metastases, retroperitoneal adenopathy ? MRI abdomen 02/28/2020-mass at the junction and head of the pancreas body, no vascular invasion, multiple hepatic metastases, retroperitoneal adenopathy ? Ultrasound-guided biopsy of a right liver lesion 03/01/2020-poorly differentiated carcinoma with necrosis, cytokeratin 7, cytokeratin 20, and GATA-3 positive ? Cycle 1 gemcitabine/Abraxane 03/25/2020 ? CT abdomen/pelvis 03/28/2020-increased pancreas mass, increased size and number of liver metastases,  progressive ascites, stable sclerotic bone lesions at the sacrum, right acetabulum, and proximal right femur, increased periaortic lymphadenopathy 2. Pain secondary to #1 3. Family history of cancer-father with adrenal cancer 4. Constipation-likely secondary to narcotic analgesics 5. Hypercalcemia 03/15/2020. Zometa 03/16/2020.  6. Admission 03/29/2020 with abdominal pain, nausea, and dehydration   Joe [name] appears to be improving.  Pain is much better controlled at this time.  He is tolerating a full liquid diet without significant nausea or vomiting.  He was able to eat [employer] over the weekend.  He is now day 11 following cycle 1 of gemcitabine/Abraxane.  It is too early to expect a response to chemotherapy.  Appetite improving with addition of prednisone.  Recommendations: 1.  Continue MS Contin and Dilaudid for pain 2.  Continue as needed antiemetics 3.  Continue prednisone to 20 mg daily. 4.  Advance diet as tolerated. 5.  Out of bed as tolerated 6.  We will start him on fluconazole 100 mg daily for oral candidiasis.  From our standpoint, the patient be discharged home once otherwise medically stable as long as he is tolerating his diet and pain and nausea control with oral pain medications and oral antiemetics.   LOS: 6 days   [name], NP   04/04/2020, 10:54 AM Joe [name] was interviewed and examined.  He appears more comfortable.  He is eating and having bowel movements.  Prednisone has helped his appetite.  We will treat the oral candidiasis.  Hopefully he can be discharged within the next 1-2 days.

## 2020-04-04 NOTE — Progress Notes (Signed)
Occupational Therapy Evaluation PTA, pt lived at home with his wife and was modified independent with ADL and mobility. Pt limited by pain this session and declined further mobility. Pt is currently limited with LB ADL due to pain/discomfort. Will educate pt on use of compensatory strategies and AE to assist with LB. Pt states he only has help if "someone is willing to help" and wants to be able to do his own self care. Will follow pt acutely to facilitate safe DC home. Do not anticipate the need for follow up OT after DC. Pt asked for theraband to work with while he is in the hospital.    04/04/20 1500  OT Visit Information  Last OT Received On 04/04/20  Assistance Needed +1  History of Present Illness 59 y.o. male  with past medical history of recent diagnosis of metastatic pancreatic cancer s/p completion of cycle 1 gemcitabine/Abrazane chemo admitted on 03/28/2020 with abdominal pain, nausea, poor oral intake. CT abdomen and pelvis done showed nodules in the lung bases, increased size and number of liver metastases compared to 02/27/2020 CT, increased size of pancreatic mass, increased periaortic adenopathy, prostate enlarged, increased ascites, stable sclerotic bone lesions noted. Also concern for ileus versus bowel obstruction secondary to pancreatic cancer. Surgery following and recommending conservative management as patient is a poor surgical candidate.  Precautions  Precautions Fall  Home Living  Family/patient expects to be discharged to: Private residence  Living Arrangements Spouse/significant other  Available Help at Discharge Family  Type of [place] to enter  Entrance Stairs-Number of Steps 6  Entrance Stairs-Rails Right;Left  Home Layout Two level  Alternate Level Stairs-Number of Steps 14-16  Alternate Level Stairs-Rails Left  Bathroom Shower/Tub Walk-in shower;Tub/shower unit (tub shower unit upstairs is what pt plans to use)  Biochemist, clinical Yes  How Accessible Accessible via walker  Home Equipment BSC  Prior Function  Level of Independence Independent  Communication  Communication No difficulties  Pain Assessment  Pain Assessment 0-10  Pain Score 9  Pain Location back; abdomen  Pain Descriptors / Indicators Discomfort;Constant;Grimacing;Guarding  Pain Intervention(s) Limited activity within patient's tolerance  Cognition  Arousal/Alertness Awake/alert  Behavior During Therapy WFL for tasks assessed/performed;Agitated (appeares agitated at wife at times)  Overall Cognitive Status Within Functional Limits for tasks assessed  Upper Extremity Assessment  Upper Extremity Assessment Generalized weakness  Lower Extremity Assessment  Lower Extremity Assessment Defer to PT evaluation (edema B feet)  Cervical / Trunk Assessment  Cervical / Trunk Assessment Normal  ADL  Overall ADL's  Needs assistance/impaired  Eating/Feeding Modified independent  Grooming Set up;Sitting  Upper Body Bathing Set up;Sitting  Lower Body Bathing Minimal assistance;Sit to/from stand  Upper Body Dressing  Set up;Sitting  Lower Body Dressing Minimal assistance;Sit to/from stand  Toileting - Clothing Manipulation Details (indicate cue type and reason) pt reports independent with hygiene  General ADL Comments LB ADL more difficult due to abdominal pain however pt states he "will have to do what he has to do ". May benefit from use of AE; Has 3in1 that he plans to use as shower seat. Usually uses walk in shower downstairs but plans to use upstiars bathroom  Bed Mobility  General bed mobility comments Pt OOB in recliner   Transfers  General transfer comment Pt declined; per PT. pt modified independent   Dynamic Gait Index  Steps  (pt reports use of hand rail at home)  OT - End of  Session  Activity Tolerance Patient limited by pain  Patient left in chair;with call bell/phone within reach;with family/visitor present  Nurse  Communication Mobility status  OT Assessment  OT Recommendation/Assessment Patient needs continued OT Services  OT Visit Diagnosis Pain;Muscle weakness (generalized) (M62.81)  Pain - Right/Left  (back; abdomen)  Pain - part of body  (back; abdomen)  OT Problem List Decreased activity tolerance;Decreased knowledge of use of DME or AE;Pain;Increased edema  OT Plan  OT Frequency (ACUTE ONLY) Min 2X/week  OT Treatment/Interventions (ACUTE ONLY) Self-care/ADL training;Therapeutic exercise;DME and/or AE instruction;Energy conservation;Therapeutic activities;Patient/family education  AM-PAC OT "6 Clicks" Daily Activity Outcome Measure (Version 2)  Help from another person eating meals? 4  Help from another person taking care of personal grooming? 3  Help from another person toileting, which includes using toliet, bedpan, or urinal? 3  Help from another person bathing (including washing, rinsing, drying)? 3  Help from another person to put on and taking off regular upper body clothing? 3  Help from another person to put on and taking off regular lower body clothing? 3  6 Click Score 19  OT Recommendation  Follow Up Recommendations No OT follow up;Supervision - Intermittent  OT Equipment None recommended by OT  Individuals Consulted  Consulted and Agree with Results and Recommendations Patient  Acute Rehab OT Goals  Patient Stated Goal to eat real food  OT Goal Formulation With patient  Time For Goal Achievement 04/18/20  Potential to Achieve Goals Good  OT Time Calculation  OT Start Time (ACUTE ONLY) 1440  OT Stop Time (ACUTE ONLY) 1501  OT Time Calculation (min) 21 min  OT General Charges  $OT Visit 1 Visit  OT Evaluation  $OT Eval Moderate Complexity 1 Mod  Written Expression  Dominant Hand Right  Maurie Boettcher, OT/L   Acute OT Clinical Specialist Acute Rehabilitation Services Pager [phone] Office [phone]

## 2020-04-04 NOTE — Evaluation (Signed)
Physical Therapy Evaluation Patient Details Name: Joe [name] MRN: [id number] DOB: [birth year] Today's Date: 04/04/2020   History of Present Illness  59 y.o. male  with past medical history of recent diagnosis of metastatic pancreatic cancer s/p completion of cycle 1 gemcitabine/Abrazane chemo admitted on 03/28/2020 with abdominal pain, nausea, poor oral intake. CT abdomen and pelvis done showed nodules in the lung bases, increased size and number of liver metastases compared to 02/27/2020 CT, increased size of pancreatic mass, increased periaortic adenopathy, prostate enlarged, increased ascites, stable sclerotic bone lesions noted. Also concern for ileus versus bowel obstruction secondary to pancreatic cancer. Surgery following and recommending conservative management as patient is a poor surgical candidate.    Clinical Impression  Joe [name] is 59 y.o. male admitted with above HPI and diagnosis. Patient is currently limited by functional impairments below (see PT problem list). Patient lives with wife and is independent at baseline. Patient evaluated by Physical Therapy with no further acute PT needs identified. All education has been completed and the patient has no further questions. Patient is mobilizing at supervision level with IV pole for gait/transfers and has been educated on benefit of ambulation for GI health, activity tolerance, and to help with LE edema. He has been instructed in goal to amb ~3-4x/day. He is safe to mobilize with NT/RN staff at this time. See below for any follow-up Physical Therapy or equipment needs. PT is signing off. Thank you for this referral; please re-order if there is a change in functional status.      Follow Up Recommendations Home health PT    Equipment Recommendations  None recommended by PT    Recommendations for Other Services       Precautions / Restrictions Precautions Precautions: Fall Restrictions Weight Bearing Restrictions: No       Mobility  Bed Mobility        General bed mobility comments: Pt OOB in recliner at start of session.  Transfers Overall transfer level: Modified independent Equipment used: None       Ambulation/Gait Ambulation/Gait assistance: Min Gaffer (Feet): 180 Feet Assistive device: IV Pole;None Gait Pattern/deviations: Step-through pattern;Decreased stride length Gait velocity: fair      Stairs         Wheelchair Mobility    Modified Rankin (Stroke Patients Only)       Balance Overall balance assessment: Mild deficits observed, not formally tested   Sitting balance-Leahy Scale: Normal     Standing balance support: No upper extremity supported;Single extremity supported Standing balance-Leahy Scale: Good   Single Leg Stance - Right Leg: 0 Single Leg Stance - Left Leg: 0 Tandem Stance - Right Leg: 0 Tandem Stance - Left Leg: 5         Standardized Balance Assessment Standardized Balance Assessment : Dynamic Gait Index   Dynamic Gait Index Level Surface: Normal Change in Gait Speed: Mild Impairment Gait with Horizontal Head Turns: Normal Gait with Vertical Head Turns: Mild Impairment Gait and Pivot Turn: Normal Step Over Obstacle: Mild Impairment Step Around Obstacles: Mild Impairment Steps: Mild Impairment (pt reports use of hand rail at home) Total Score: 19       Pertinent Vitals/Pain Pain Assessment: 0-10 Pain Score: 8  Pain Location: back Pain Descriptors / Indicators: Discomfort;Constant Pain Intervention(s): Limited activity within patient's tolerance;Monitored during session;Repositioned;Heat applied    Home Living Family/patient expects to be discharged to:: Private residence Living Arrangements: Spouse/significant other Available Help at Discharge: Family Type of Home: House Home Access:  Stairs to enter Entrance Stairs-Rails: Psychiatric nurse of Steps: 6 Home Layout: Two level Home  Equipment: Bedside commode      Prior Function Level of Independence: Independent               Hand Dominance   Dominant Hand: Right    Extremity/Trunk Assessment   Upper Extremity Assessment Upper Extremity Assessment: Overall WFL for tasks assessed    Lower Extremity Assessment Lower Extremity Assessment: Overall WFL for tasks assessed    Cervical / Trunk Assessment Cervical / Trunk Assessment: Normal  Communication   Communication: No difficulties  Cognition Arousal/Alertness: Awake/alert Behavior During Therapy: WFL for tasks assessed/performed Overall Cognitive Status: Within Functional Limits for tasks assessed                 General Comments General comments (skin integrity, edema, etc.): 5x Sit<>Stand Test: 63 seconds, with use of UE's for 3/5 stands. pt also slow to return to sit.     Exercises     Assessment/Plan    PT Assessment All further PT needs can be met in the next venue of care  PT Problem List Decreased strength;Decreased activity tolerance;Decreased balance;Decreased mobility;Decreased knowledge of use of DME       PT Treatment Interventions      PT Goals (Current goals can be found in the Care Plan section)  Acute Rehab PT Goals Patient Stated Goal: to eat real food PT Goal Formulation: All assessment and education complete, DC therapy Time For Goal Achievement: 04/18/20 Potential to Achieve Goals: Good    Frequency  1x eval/treat    AM-PAC PT "6 Clicks" Mobility  Outcome Measure Help needed turning from your back to your side while in a flat bed without using bedrails?: A Little Help needed moving from lying on your back to sitting on the side of a flat bed without using bedrails?: A Little Help needed moving to and from a bed to a chair (including a wheelchair)?: None Help needed standing up from a chair using your arms (e.g., wheelchair or bedside chair)?: None Help needed to walk in hospital room?: A Little Help  needed climbing 3-5 steps with a railing? : A Little 6 Click Score: 20    End of Session Equipment Utilized During Treatment: Gait belt Activity Tolerance: Patient tolerated treatment well Patient left: in chair;with call bell/phone within reach Nurse Communication: Mobility status PT Visit Diagnosis: Unsteadiness on feet (R26.81);Muscle weakness (generalized) (M62.81);Difficulty in walking, not elsewhere classified (R26.2)    Time: 1206-1239 PT Time Calculation (min) (ACUTE ONLY): 33 min   Charges:   PT Evaluation $PT Eval Low Complexity: 1 Low PT Treatments $Gait Training: 8-22 mins        Verner Mould, DPT Acute Rehabilitation Services  Office [phone] Pager [phone]  04/04/2020 1:53 PM

## 2020-04-04 NOTE — Progress Notes (Signed)
CC: Abdominal pain  Subjective: Patient is tolerating full liquids and having bowel movements.  He is anxious to increase his diet.  Objective: Vital signs in last 24 hours: Temp:  [97.4 F (36.3 C)-99 F (37.2 C)] 98.2 F (36.8 C) (07/26 1000) Pulse Rate:  [108-113] 109 (07/26 0516) Resp:  [16-21] 16 (07/26 0516) BP: (126-151)/(77-91) 132/83 (07/26 1000) SpO2:  [93 %-96 %] 94 % (07/26 1000) Last BM Date: 04/03/20 120 p.o. recorded today Voided x1 yesterday No recorded oral intake yesterday was 1 week ago but I will have time right now IV bowel BM x1 today and yesterday. Patient is afebrile vital signs are stable. Mag 1.8 WBC 10.5 Hemoglobin 8.1 hematocrit 22.1 Platelets 327,000 7/24 single view abdominal film shows persistent gaseous distention of the transverse colon with abrupt cut off at the level of the proximal descending.   Intake/Output from previous day: 07/25 0701 - 07/26 0700 In: -  Out: 300 [Urine:300] Intake/Output this shift: Total I/O In: 120 [P.O.:120] Out: -   General appearance: alert, cooperative and no distress GI: Soft not complaining of any tenderness currently.  Positive bowel sounds and positive BM.  Lab Results:  Recent Labs    04/03/20 0942 04/04/20 0839  WBC 11.2* 10.5  HGB 7.7* 8.1*  HCT 21.7* 22.1*  PLT 241 327    BMET Recent Labs    04/03/20 0942 04/04/20 0541  NA 136 132*  K 3.8 4.1  CL 104 99  CO2 25 27  GLUCOSE 158* 120*  BUN 8 7  CREATININE 0.34* 0.35*  CALCIUM 7.8* 8.4*   PT/INR No results for input(s): LABPROT, INR in the last 72 hours.  Recent Labs  Lab 03/28/20 1546 03/28/20 1546 03/29/20 0415 03/31/20 0354 04/02/20 0500 04/03/20 0942 04/04/20 0541  AST 67*  --  67* 30  --   --   --   ALT 43  --  40 28  --   --   --   ALKPHOS 264*  --  247* 215*  --   --   --   BILITOT 1.3*  --  0.8 0.9  --   --   --   PROT 6.4*  --  5.8* 5.3*  --   --   --   ALBUMIN 2.2*   < > 2.0* 2.0* 2.6* 2.3* 2.4*   <  > = values in this interval not displayed.     Lipase     Component Value Date/Time   LIPASE 24 03/07/2020 1417     Medications: . (feeding supplement) PROSource Plus  30 mL Oral BID BM  . bisacodyl  10 mg Rectal Daily  . Chlorhexidine Gluconate Cloth  6 each Topical Daily  . enoxaparin (LOVENOX) injection  40 mg Subcutaneous Q24H  . feeding supplement  1 Container Oral TID BM  . feeding supplement (ENSURE ENLIVE)  237 mL Oral TID WC  . fluconazole  100 mg Oral Daily  . furosemide  20 mg Intravenous Once  . guaiFENesin  1,200 mg Oral BID  . lipase/protease/amylase  36,000 Units Oral TID AC  . morphine  30 mg Oral Q8H  . multivitamin with minerals  1 tablet Oral Daily  . polyethylene glycol  17 g Oral Daily  . predniSONE  20 mg Oral Q breakfast  . senna-docusate  1 tablet Oral QHS  . simethicone  160 mg Oral QID   . dextrose 5 % and 0.9% NaCl 50 mL/hr at 04/04/20 0756  Anti-infectives (From admission, onward)   Start     Dose/Rate Route Frequency Ordered Stop   04/04/20 1000  fluconazole (DIFLUCAN) tablet 100 mg     Discontinue     100 mg Oral Daily 04/04/20 0936      Assessment/Plan Metastatic pancreatic cancer stage IV Dehydration Constipation secondary to narcotics Failure to thrive Severe protein calorie malnutrition Hypokalemia Anemia  Intractable abdominal pain abdominal distention/colonic ileus versus partial obstruction  -Resolving ileus -BM 7/25 and 726 recorded  -Continue MiraLAX and aggressive bowel regime for his constipation.    FEN: IV fluids/full liquids ID: Diflucan DVT: Lovenox Follow-up: TBD  Plan: Patient does not appear obstructed, he is having bowel movements yesterday and today.  He is tolerating full liquids.  I will defer advancing his diet to Dr. Grandville Silos.  He is on MS Contin and OxyFast for pain control which will make his constipation/ileus a chronic issue.  Currently no surgical recommendations.  Please call if we can be of further  assistance.    LOS: 6 days    Cozette Braggs 04/04/2020 Please see Amion

## 2020-04-04 NOTE — Progress Notes (Signed)
PROGRESS NOTE    Joe [name]  JXB:[id number] DOB: [birth year] DOA: 03/28/2020 PCP: Patient, No Pcp Per    Chief Complaint  Patient presents with   Dehydration    Brief Narrative:  Patient is an unfortunate 59 year old gentleman history of recently diagnosed metastatic prostate cancer status post completion of cycle 1 gemcitabine/Abraxane chemotherapy on 03/25/2020 presented to the ED with worsening abdominal pain, nausea, poor oral intake, noted to be dehydrated.  CT abdomen and pelvis done showed nodules in the lung bases, increased size and number of liver metastases compared to 02/27/2020 CT, increased size of pancreatic mass, increased periaortic adenopathy, prostate enlarged, increased ascites, stable sclerotic bone lesions noted.  Patient placed back on MS Contin twice daily and fentanyl initially.  Oncology consulted and informed of patient's admission.  Pain medications being adjusted for better control.  Palliative care consulted.   Assessment & Plan:   Principal Problem:   Abdominal pain Active Problems:   Pancreatic mass   Nausea and vomiting   Anemia   Leukocytosis   Malignant neoplasm of head of pancreas (HCC)   Dehydration   Hypokalemia   Failure to thrive in adult   Severe protein-calorie malnutrition (HCC)   Intractable nausea and vomiting   Ileus ([place])   Palliative care by specialist  1 intractable abdominal pain/nausea/colonic ileus versus obstruction Secondary to advanced pancreatic head cancer versus concern for ileus versus bowel obstruction secondary to pancreatic cancer..  CT abdomen and pelvis which was done showed increased size and number of liver mets compared to CT from 02/27/2020 as well as increased size of pancreatic mass, increased periaortic adenopathy, increased ascites, stable sclerotic bone lesions noted.  Patient with nausea complaining of feeling gassy.  Patient currently on simethicone 160 mg p.o. 4 times daily. Patient's home dose MS  Contin was resumed and dose has been increased per oncology to 30 mg p.o. twice daily. Home dose oral oxycodone resumed and dose increased and changed to a liquid format per oncology.  IV morphine has been discontinued and patient currently on IV Dilaudid every 3 hours as needed for pain.  Patient also started on oral Dilaudid per palliative care. Patient with abdominal distention which is slightly improved, abdominal tightness slightly improved, and still with abdominal pain however currently manageable..  Abdominal films ordered with gaseous distention of the transverse colon without definite evidence of obstruction or wall thickening.  Concern for possible ileus versus small bowel obstruction secondary to pancreatic cancer.  Patient seen in consultation by general surgery well recommending conservative treatment at this time as patient a poor candidate.  Patient with no vomiting.  Patient currently tolerating full liquid diet per general surgeon asking for diet to be advanced.  Patient with bowel movements.  No emesis.  General surgery okay with advancement of diet.  Advance to a soft diet.  Continue daily MiraLAX.  Keep potassium >4.  Keep magnesium > 2.  General surgery, oncology following and appreciate input and recommendations.  Palliative care following for pain management and goals of care.   2.  Dehydration Secondary to poor oral intake, nausea, concern for possible ileus.  Patient on full liquid diet.  Advance to a soft diet.  Saline lock IV fluids.  Supportive care.   3.  Metastatic pancreatic cancer stage IV Status post cycle 1 gemcitabine/Abraxane chemotherapy 03/25/2020.  Oncology informed of patient's admission and are following.  Per oncology too early to expect a response from chemotherapy.  Palliative care consulted for goals of care and  patient wanting aggressive treatment at this time.  Palliative care helping with pain management.  Per oncology.    4.  Constipation Felt secondary to  narcotic pain medication.  Patient however with multiple loose stools and refused MiraLAX initially.  MiraLAX was initially discontinued but has been resumed by general surgery.  Patient with abdominal distention which is slightly improved still with abdominal pain.  Abdominal films obtained with gaseous distention colon without definite evidence of obstruction or wall thickening.  Patient currently on a full liquid diet which is tolerating.  Patient asking for diet to be advanced.  Patient was seen in consultation by general surgery with concerns for possible colonic ileus versus partial small bowel obstruction.  General surgery recommending conservative treatment at this time as patient is a poor surgical candidate.  Patient having bowel movements.  Patient with some improvement clinically.  General surgery okay with diet advancement.  Advance to a soft diet.  Continue daily MiraLAX.  Follow.    5.  Failure to thrive Likely secondary to problem #3.  Tolerating full liquid diet.  Will advance to soft diet.  Follow.   6.  Severe protein calorie malnutrition Likely secondary to problem #3.  Patient with some clinical improvement.  Tolerating full liquid diet.  Having bowel movements.  Advance to a soft diet.  Place on nutritional supplementation.  Albumin level at 2.3.  Status post IV albumin x 1 day.  Follow..   7.  Hypokalemia Repleted.  Potassium at 4.1.. Magnesium at 1.8.  Follow.   8.  Anemia of chronic disease, POA Likely secondary to metastatic pancreatic cancer.  Patient with no overt bleeding.  Anemia panel with iron level of 11, TIBC of 85, ferritin of 1555, folate of 8.1, vitamin B12 of 2433.  H&H stable at 8.1.  Transfusion threshold hemoglobin < 7.  9.  Leukocytosis Questionable etiology.  Patient afebrile.  Patient with no respiratory symptoms.  Chest x-ray with atelectasis versus infiltrate however doubt if infiltrate.  Leukocytosis trending down.  Urinalysis negative for any acute UTI.   No need for antibiotics at this time.  Follow.   10.  Tachycardia Secondary to abdominal pain.  EKG with a sinus tachycardia.  Improving.  Gentle hydration.  IV Lopressor as needed.  Pain management.  Supportive care.     DVT prophylaxis: Lovenox Code Status: Full Family Communication: Updated patient.  No family at bedside.   Disposition:   Status is: Inpatient    Dispo: The patient is from: Home              Anticipated d/c is to: To be determined.  Likely home.              Anticipated d/c date is: 1 to 2 days.              Patient currently with metastatic pancreatic cancer, with significant abdominal pain, nausea, poor oral intake, dehydrated.  Concern for possible ileus versus partial small bowel obstruction.  Pain regimen being adjusted for better pain control.  Poor oral intake.  Diet to be advanced.  Currently not stable for discharge.       Consultants:   Oncology: Dr. [name] 03/30/2020  Palliative care: Ihor Dow, NP 04/01/2020  General surgery: Dr. Lucia Gaskins 03/31/2020  Procedures:   Chest x-ray 03/30/2020  Abdominal films 03/30/2020, 03/31/2020  CT abdomen and pelvis 03/28/2020    Antimicrobials:   None   Subjective: Patient sitting up in chair.  Patient asking for diet  to be advanced.  Denies chest pain or shortness of breath.  Denies any significant worsening of abdominal pain.  States having bowel movements.  Tolerating full liquids.  States current pain regimen helping with his pain management.  Objective: Vitals:   04/03/20 2141 04/04/20 0141 04/04/20 0516 04/04/20 1000  BP: (!) 136/91 (!) 130/77 128/78 (!) 132/83  Pulse: (!) 109 (!) 108 (!) 109   Resp: 17 17 16    Temp: 98.4 F (36.9 C) 98.2 F (36.8 C) 98.3 F (36.8 C) 98.2 F (36.8 C)  TempSrc: Oral Oral Oral Oral  SpO2: 95% 93% 95% 94%  Weight:      Height:        Intake/Output Summary (Last 24 hours) at 04/04/2020 1210 Last data filed at 04/04/2020 1142 Gross per 24 hour  Intake 120  ml  Output --  Net 120 ml   Filed Weights   03/28/20 1405 03/29/20 0537  Weight: 63.5 kg 62.8 kg    Examination:  General exam: Frail.  Cachectic.  Temporal wasting.  Respiratory system: CTAB.  No wheezes, no crackles, no rhonchi.  Normal respiratory effort. Cardiovascular system: Tachycardic.  No JVD, no murmurs rubs or gallops.  2+ bilateral lower extremity edema.  Gastrointestinal system: Abdomen is less tight, less distended, positive bowel sounds, diffuse tenderness to palpation.  No rebound.  No guarding. Central nervous system: Alert and oriented. No focal neurological deficits. Extremities: Symmetric 5 x 5 power. Skin: No rashes, lesions or ulcers Psychiatry: Judgement and insight appear normal. Mood & affect appropriate.     Data Reviewed: I have personally reviewed following labs and imaging studies  CBC: Recent Labs  Lab 03/28/20 1546 03/29/20 0415 03/31/20 1223 04/01/20 0709 04/02/20 0659 04/03/20 0942 04/04/20 0839  WBC 43.5*   < > 14.9* 15.8* 11.6* 11.2* 10.5  NEUTROABS 41.4*  --  13.5*  --  9.6* 8.9*  --   HGB 9.7*   < > 9.0* 8.7* 8.1* 7.7* 8.1*  HCT 26.6*   < > 25.0* 24.4* 22.6* 21.7* 22.1*  MCV 74.5*   < > 74.6* 73.7* 74.6* 73.8* 73.4*  PLT 608*   < > 191 178 175 241 327   < > = values in this interval not displayed.    Basic Metabolic Panel: Recent Labs  Lab 03/28/20 1546 03/29/20 0415 03/30/20 0545 03/30/20 0545 03/31/20 0354 04/01/20 0709 04/02/20 0500 04/02/20 0659 04/03/20 0703 04/03/20 0942 04/04/20 0541 04/04/20 0839  NA 137   < > 134*   < > 136 134* 137  --   --  136 132*  --   K 3.8   < > 3.4*   < > 4.1 3.9 3.7  --   --  3.8 4.1  --   CL 95*   < > 102   < > 104 102 102  --   --  104 99  --   CO2 31   < > 24   < > 24 23 27   --   --  25 27  --   GLUCOSE 156*   < > 141*   < > 126* 137* 116*  --   --  158* 120*  --   BUN 21*   < > 11   < > 12 10 10   --   --  8 7  --   CREATININE 0.41*   < > 0.37*   < > 0.36* 0.39* 0.39*  --   --   0.34* 0.35*  --  CALCIUM 8.1*   < > 7.1*   < > 7.3* 7.6* 8.2*  --   --  7.8* 8.4*  --   MG 2.3  --  2.2  --   --  2.0  --  1.7 1.9  --   --  1.8  PHOS 2.2*  --   --   --   --   --  2.0*  --   --  2.5 2.5  --    < > = values in this interval not displayed.    GFR: Estimated Creatinine Clearance: 89.4 mL/min (A) (by C-G formula based on SCr of 0.35 mg/dL (L)).  Liver Function Tests: Recent Labs  Lab 03/28/20 1546 03/28/20 1546 03/29/20 0415 03/31/20 0354 04/02/20 0500 04/03/20 0942 04/04/20 0541  AST 67*  --  67* 30  --   --   --   ALT 43  --  40 28  --   --   --   ALKPHOS 264*  --  247* 215*  --   --   --   BILITOT 1.3*  --  0.8 0.9  --   --   --   PROT 6.4*  --  5.8* 5.3*  --   --   --   ALBUMIN 2.2*   < > 2.0* 2.0* 2.6* 2.3* 2.4*   < > = values in this interval not displayed.    CBG: No results for input(s): GLUCAP in the last 168 hours.   Recent Results (from the past 240 hour(s))  SARS Coronavirus 2 by RT PCR (hospital order, performed in [facility] hospital lab) Nasopharyngeal Nasopharyngeal Swab     Status: None   Collection Time: 03/28/20  9:00 PM   Specimen: Nasopharyngeal Swab  Result Value Ref Range Status   SARS Coronavirus 2 NEGATIVE NEGATIVE Final    Comment: (NOTE) SARS-CoV-2 target nucleic acids are NOT DETECTED.  The SARS-CoV-2 RNA is generally detectable in upper and lower respiratory specimens during the acute phase of infection. The lowest concentration of SARS-CoV-2 viral copies this assay can detect is 250 copies / mL. A negative result does not preclude SARS-CoV-2 infection and should not be used as the sole basis for treatment or other patient management decisions.  A negative result may occur with improper specimen collection / handling, submission of specimen other than nasopharyngeal swab, presence of viral mutation(s) within the areas targeted by this assay, and inadequate number of viral copies (<250 copies / mL). A negative result must  be combined with clinical observations, patient history, and epidemiological information.  Fact Sheet for Patients:   StrictlyIdeas.no  Fact Sheet for Healthcare Providers: BankingDealers.co.za  This test is not yet approved or  cleared by the [place] FDA and has been authorized for detection and/or diagnosis of SARS-CoV-2 by FDA under an Emergency Use Authorization (EUA).  This EUA will remain in effect (meaning this test can be used) for the duration of the COVID-19 declaration under Section 564(b)(1) of the Act, 21 U.S.C. section 360bbb-3(b)(1), unless the authorization is terminated or revoked sooner.  Performed at [facility], [place] [address] [place], [place] [address]   Culture, Urine     Status: None   Collection Time: 03/30/20 11:50 AM   Specimen: Urine, Catheterized  Result Value Ref Range Status   Specimen Description   Final    URINE, CATHETERIZED Performed at [facility] [address] [place], [place] [address]    Special Requests  Final    NONE Performed at [facility], [place] [address], [place], [place] [address]    Culture   Final    NO GROWTH Performed at [facility], [place] [address], [place], [place] [address]    Report Status 03/31/2020 FINAL  Final         Radiology Studies: No results found.      Scheduled Meds:  (feeding supplement) PROSource Plus  30 mL Oral BID BM   bisacodyl  10 mg Rectal Daily   Chlorhexidine Gluconate Cloth  6 each Topical Daily   enoxaparin (LOVENOX) injection  40 mg Subcutaneous Q24H   feeding supplement  1 Container Oral TID BM   feeding supplement (ENSURE ENLIVE)  237 mL Oral TID WC   fluconazole  100 mg Oral Daily   furosemide  20 mg Intravenous Once   guaiFENesin  1,200 mg Oral BID   lipase/protease/amylase  36,000 Units Oral TID AC   morphine  30 mg Oral Q8H   multivitamin  with minerals  1 tablet Oral Daily   polyethylene glycol  17 g Oral Daily   predniSONE  20 mg Oral Q breakfast   senna-docusate  1 tablet Oral QHS   simethicone  160 mg Oral QID   Continuous Infusions:  dextrose 5 % and 0.9% NaCl 50 mL/hr at 04/04/20 0756     LOS: 6 days    Time spent: 35 minutes    Irine Seal, MD [facility]   To contact the attending provider between 7A-7P or the covering provider during after hours 7P-7A, please log into the web site [email or web address] and access using universal  password for that web site. If you do not have the password, please call the hospital operator.  04/04/2020, 12:10 PM

## 2020-04-05 DIAGNOSIS — G893 Neoplasm related pain (acute) (chronic): Secondary | ICD-10-CM | POA: Diagnosis not present

## 2020-04-05 DIAGNOSIS — R1084 Generalized abdominal pain: Secondary | ICD-10-CM | POA: Diagnosis not present

## 2020-04-05 DIAGNOSIS — K8689 Other specified diseases of pancreas: Secondary | ICD-10-CM | POA: Diagnosis not present

## 2020-04-05 DIAGNOSIS — E860 Dehydration: Secondary | ICD-10-CM | POA: Diagnosis not present

## 2020-04-05 DIAGNOSIS — B370 Candidal stomatitis: Secondary | ICD-10-CM

## 2020-04-05 DIAGNOSIS — E43 Unspecified severe protein-calorie malnutrition: Secondary | ICD-10-CM | POA: Diagnosis not present

## 2020-04-05 DIAGNOSIS — R627 Adult failure to thrive: Secondary | ICD-10-CM | POA: Diagnosis not present

## 2020-04-05 LAB — CBC
HCT: 23.2 % — ABNORMAL LOW (ref 39.0–52.0)
Hemoglobin: 8.4 g/dL — ABNORMAL LOW (ref 13.0–17.0)
MCH: 26.6 pg (ref 26.0–34.0)
MCHC: 36.2 g/dL — ABNORMAL HIGH (ref 30.0–36.0)
MCV: 73.4 fL — ABNORMAL LOW (ref 80.0–100.0)
Platelets: 446 10*3/uL — ABNORMAL HIGH (ref 150–400)
RBC: 3.16 MIL/uL — ABNORMAL LOW (ref 4.22–5.81)
RDW: 14 % (ref 11.5–15.5)
WBC: 10.9 10*3/uL — ABNORMAL HIGH (ref 4.0–10.5)
nRBC: 0.2 % (ref 0.0–0.2)

## 2020-04-05 LAB — BASIC METABOLIC PANEL
Anion gap: 9 (ref 5–15)
BUN: 9 mg/dL (ref 6–20)
CO2: 26 mmol/L (ref 22–32)
Calcium: 9.1 mg/dL (ref 8.9–10.3)
Chloride: 98 mmol/L (ref 98–111)
Creatinine, Ser: 0.35 mg/dL — ABNORMAL LOW (ref 0.61–1.24)
GFR calc Af Amer: >60 mL/min (ref >60)
GFR calc non Af Amer: >60 mL/min (ref >60)
Glucose, Bld: 103 mg/dL — ABNORMAL HIGH (ref 70–99)
Potassium: 4.1 mmol/L (ref 3.5–5.1)
Sodium: 133 mmol/L — ABNORMAL LOW (ref 135–145)

## 2020-04-05 LAB — MAGNESIUM: Magnesium: 1.7 mg/dL (ref 1.7–2.4)

## 2020-04-05 MED ORDER — MAGNESIUM SULFATE 4 GM/100ML IV SOLN
4.0000 g | Freq: Once | INTRAVENOUS | Status: AC
  Administered 2020-04-05: 4 g via INTRAVENOUS
  Filled 2020-04-05: qty 100

## 2020-04-05 NOTE — Progress Notes (Signed)
   04/05/20 0605  Vitals  Temp 97.8 F (36.6 C)  Temp Source Oral  BP (!) 141/90  MAP (mmHg) 105  BP Location Left Arm  BP Method Automatic  Patient Position (if appropriate) Sitting  Pulse Rate (!) 123  Pulse Rate Source Monitor  Resp 16  MEWS COLOR  MEWS Score Color Yellow  Oxygen Therapy  SpO2 94 %  O2 Device Room Air  MEWS Score  MEWS Temp 0  MEWS Systolic 0  MEWS Pulse 2  MEWS RR 0  MEWS LOC 0  MEWS Score 2  Patient currently on treatment for Tachycardia. No new complain will follow mews protocol

## 2020-04-05 NOTE — Progress Notes (Signed)
   04/05/20 0758  Assess: MEWS Score  Temp 97.8 F (36.6 C)  BP (!) 118/88  Pulse Rate (!) 118  Resp 18  SpO2 97 %  O2 Device Room Air  Assess: MEWS Score  MEWS Temp 0  MEWS Systolic 0  MEWS Pulse 2  MEWS RR 0  MEWS LOC 0  MEWS Score 2  MEWS Score Color Yellow  Assess: if the MEWS score is Yellow or Red  Were vital signs taken at a resting state? Yes  Focused Assessment No change from prior assessment  Early Detection of Sepsis Score *See Row Information* Low  MEWS guidelines implemented *See Row Information* No, previously yellow, continue vital signs every 4 hours   Patient has a PRN medication for when his HR is greater than 130. Order parameter has not yet been met. Will continue to monitor.

## 2020-04-05 NOTE — Progress Notes (Signed)
Joe [name]  HQI:[id number] DOB: [birth year] DOA: 03/28/2020 PCP: Patient, No Pcp Per    Chief Complaint  Patient presents with   Dehydration    Brief Narrative:  Patient is an unfortunate 59 year old gentleman history of recently diagnosed metastatic prostate cancer status post completion of cycle 1 gemcitabine/Abraxane chemotherapy on 03/25/2020 presented to the ED with worsening abdominal pain, nausea, poor oral intake, noted to be dehydrated.  CT abdomen and pelvis done showed nodules in the lung bases, increased size and number of liver metastases compared to 02/27/2020 CT, increased size of pancreatic mass, increased periaortic adenopathy, prostate enlarged, increased ascites, stable sclerotic bone lesions noted.  Patient placed back on MS Contin twice daily and fentanyl initially.  Oncology consulted and informed of patient's admission.  Pain medications being adjusted for better control.  Palliative care consulted.   Assessment & Plan:   Principal Problem:   Abdominal pain Active Problems:   Pancreatic mass   Nausea and vomiting   Anemia   Leukocytosis   Malignant neoplasm of head of pancreas (HCC)   Dehydration   Hypokalemia   Failure to thrive in adult   Severe protein-calorie malnutrition (HCC)   Intractable nausea and vomiting   Ileus (HCC)   Palliative care by specialist   Cancer related pain  1 intractable abdominal pain/nausea/colonic ileus versus obstruction Secondary to advanced pancreatic head cancer versus concern for ileus versus bowel obstruction secondary to pancreatic cancer..  CT abdomen and pelvis which was done showed increased size and number of liver mets compared to CT from 02/27/2020 as well as increased size of pancreatic mass, increased periaortic adenopathy, increased ascites, stable sclerotic bone lesions noted.  Patient with nausea complaining of feeling gassy.  Patient currently on simethicone 160 mg p.o. 4 times daily.  Patient's home dose MS Contin was resumed and dose has been increased per oncology to 30 mg p.o. twice daily. Home dose oral oxycodone resumed and dose increased and changed to a liquid format per oncology.  IV morphine has been discontinued and patient currently on IV Dilaudid every 3 hours as needed for pain.  Patient also started on oral Dilaudid and dose being adjusted per palliative care. Patient with abdominal distention which has improved somewhat with improvement with abdominal tightness.  Abdominal pain currently controlled on current pain regimen.  Abdominal films ordered with gaseous distention of the transverse colon without definite evidence of obstruction or wall thickening.  Concern for possible ileus versus small bowel obstruction secondary to pancreatic cancer.  Patient seen in consultation by general surgery and recommended conservative treatment at this time as patient a poor candidate.  Patient with no vomiting.  Patient currently tolerating soft diet.  Patient with bowel movements.  No emesis.   Continue daily MiraLAX.  Keep potassium >4.  Keep magnesium > 2.  General surgery, oncology following and appreciate input and recommendations.  Palliative care following for pain management and goals of care.  Patient will need palliative care to follow on discharge.  2.  Dehydration Secondary to poor oral intake, nausea, concern for possible ileus.  Diet has been advanced to a soft diet which he is tolerating.  Supportive care.  3.  Metastatic pancreatic cancer stage IV Status post cycle 1 gemcitabine/Abraxane chemotherapy 03/25/2020.  Oncology informed of patient's admission and are following.  Per oncology too early to expect a response from chemotherapy.  Palliative care consulted for goals of care and patient wanting aggressive treatment at this time.  Palliative care helping with pain management.  Patient will need palliative care to follow on discharge.  Outpatient follow-up with oncology  already scheduled for 04/08/2020.  Per oncology.    4.  Constipation Felt secondary to narcotic pain medication.  Patient however with multiple loose stools and refused MiraLAX initially.  MiraLAX was initially discontinued but has been resumed by general surgery.  Patient with abdominal distention which is slightly improved still with abdominal pain.  Abdominal films obtained with gaseous distention colon without definite evidence of obstruction or wall thickening.  Patient currently on a soft diet which is tolerating. Patient was seen in consultation by general surgery with concerns for possible colonic ileus versus partial small bowel obstruction.  General surgery recommending conservative treatment at this time as patient is a poor surgical candidate.  Patient having bowel movements.  Tolerating current diet.  Continue daily MiraLAX.  Follow.   5.  Failure to thrive Likely secondary to problem #3.  Tolerating soft diet.  Follow.   6.  Severe protein calorie malnutrition Likely secondary to problem #3.  Diet was advanced to a soft diet which patient is tolerating.  Albumin level 2.4.  Status post IV albumin x1 day.  Continue nutritional supplementation.   7.  Hypokalemia Repleted.  Potassium at 4.1.. Magnesium at 1.7.  Magnesium sulfate 4 g IV x1.  Follow.   8.  Anemia of chronic disease, POA Likely secondary to metastatic pancreatic cancer.  Patient with no overt bleeding.  Anemia panel with iron level of 11, TIBC of 85, ferritin of 1555, folate of 8.1, vitamin B12 of 2433.  H&H stable at 8.4.  Transfusion threshold hemoglobin < 7.  9.  Leukocytosis Questionable etiology.  Patient afebrile.  Patient with no respiratory symptoms.  Chest x-ray with atelectasis versus infiltrate however doubt if infiltrate.  Leukocytosis trending down.  Urinalysis negative for any acute UTI.  No need for antibiotics at this time.  Follow.   10.  Tachycardia Secondary to abdominal pain.  EKG with a sinus  tachycardia.  Tachycardia improving with pain management and hydration.  IV fluids have been saline lock.  IV Lopressor as needed.  Continue current pain management.  Supportive care.  11.  Oral thrush  Continue Diflucan to complete a 7-day course.   DVT prophylaxis: Lovenox Code Status: Full Family Communication: Updated patient.  No family at bedside.   Disposition:   Status is: Inpatient    Dispo: The patient is from: Home              Anticipated d/c is to: Home with home health therapies.  To be determined.  Likely home.              Anticipated d/c date is: 04/06/2020.              Patient currently with metastatic pancreatic cancer, with significant abdominal pain, nausea, poor oral intake, dehydrated.  Concern for possible ileus versus partial small bowel obstruction.  Pain regimen being adjusted for better pain control.  Poor oral intake.  Diet to be advanced.  Currently not stable for discharge.  Hopefully once pain has been managed adequately could likely be discharged hopefully tomorrow.       Consultants:   Oncology: Dr. [name] 03/30/2020  Palliative care: Ihor Dow, NP 04/01/2020  General surgery: Dr. Lucia Gaskins 03/31/2020  Procedures:   Chest x-ray 03/30/2020  Abdominal films 03/30/2020, 03/31/2020  CT abdomen and pelvis 03/28/2020    Antimicrobials:   None  Subjective: Patient sleeping but arousable.  Denies any nausea or emesis.  States he tolerated soft diet.  Still with abdominal pain pain regimen helping.  Having bowel movements.  Feeling a little bit better than he did on admission.    Objective: Vitals:   04/04/20 1814 04/04/20 2131 04/05/20 0605 04/05/20 0758  BP: (!) 134/80 128/78 (!) 141/90 (!) 118/88  Pulse: (!) 109 102 (!) 123 (!) 118  Resp: 18 16 16 18   Temp: 97.7 F (36.5 C) (!) 97.4 F (36.3 C) 97.8 F (36.6 C) 97.8 F (36.6 C)  TempSrc: Oral Oral Oral Oral  SpO2: 97% 98% 94% 97%  Weight:      Height:        Intake/Output  Summary (Last 24 hours) at 04/05/2020 1232 Last data filed at 04/05/2020 1140 Gross per 24 hour  Intake 1046.09 ml  Output 400 ml  Net 646.09 ml   Filed Weights   03/28/20 1405 03/29/20 0537  Weight: 63.5 kg 62.8 kg    Examination:  General exam: Cachectic.  Temporal wasting.  Frail.  Chronically ill-appearing. Respiratory system: Lungs clear to auscultation bilaterally anterior lung fields.  No wheezes, no crackles, no rhonchi.  Normal respiratory effort.  Cardiovascular system: Tachycardia.  No murmurs rubs or gallops.  No JVD.  2+ bilateral lower extremity edema.  Gastrointestinal system: Abdomen is less distended, less tight, positive bowel sounds, decreased diffuse tenderness to palpation.  No rebound.  No guarding.  Central nervous system: Alert and oriented. No focal neurological deficits. Extremities: Symmetric 5 x 5 power. Skin: No rashes, lesions or ulcers Psychiatry: Judgement and insight appear normal. Mood & affect appropriate.     Data Reviewed: I have personally reviewed following labs and imaging studies  CBC: Recent Labs  Lab 03/31/20 1223 03/31/20 1223 04/01/20 0709 04/02/20 0659 04/03/20 0942 04/04/20 0839 04/05/20 0539  WBC 14.9*   < > 15.8* 11.6* 11.2* 10.5 10.9*  NEUTROABS 13.5*  --   --  9.6* 8.9*  --   --   HGB 9.0*   < > 8.7* 8.1* 7.7* 8.1* 8.4*  HCT 25.0*   < > 24.4* 22.6* 21.7* 22.1* 23.2*  MCV 74.6*   < > 73.7* 74.6* 73.8* 73.4* 73.4*  PLT 191   < > 178 175 241 327 446*   < > = values in this interval not displayed.    Basic Metabolic Panel: Recent Labs  Lab 04/01/20 0709 04/02/20 0500 04/02/20 0659 04/03/20 0703 04/03/20 0942 04/04/20 0541 04/04/20 0839 04/05/20 0539  NA 134* 137  --   --  136 132*  --  133*  K 3.9 3.7  --   --  3.8 4.1  --  4.1  CL 102 102  --   --  104 99  --  98  CO2 23 27  --   --  25 27  --  26  GLUCOSE 137* 116*  --   --  158* 120*  --  103*  BUN 10 10  --   --  8 7  --  9  CREATININE 0.39* 0.39*  --   --   0.34* 0.35*  --  0.35*  CALCIUM 7.6* 8.2*  --   --  7.8* 8.4*  --  9.1  MG 2.0  --  1.7 1.9  --   --  1.8 1.7  PHOS  --  2.0*  --   --  2.5 2.5  --   --  GFR: Estimated Creatinine Clearance: 89.4 mL/min (A) (by C-G formula based on SCr of 0.35 mg/dL (L)).  Liver Function Tests: Recent Labs  Lab 03/31/20 0354 04/02/20 0500 04/03/20 0942 04/04/20 0541  AST 30  --   --   --   ALT 28  --   --   --   ALKPHOS 215*  --   --   --   BILITOT 0.9  --   --   --   PROT 5.3*  --   --   --   ALBUMIN 2.0* 2.6* 2.3* 2.4*    CBG: No results for input(s): GLUCAP in the last 168 hours.   Recent Results (from the past 240 hour(s))  SARS Coronavirus 2 by RT PCR (hospital order, performed in [facility] hospital lab) Nasopharyngeal Nasopharyngeal Swab     Status: None   Collection Time: 03/28/20  9:00 PM   Specimen: Nasopharyngeal Swab  Result Value Ref Range Status   SARS Coronavirus 2 NEGATIVE NEGATIVE Final    Comment: (NOTE) SARS-CoV-2 target nucleic acids are NOT DETECTED.  The SARS-CoV-2 RNA is generally detectable in upper and lower respiratory specimens during the acute phase of infection. The lowest concentration of SARS-CoV-2 viral copies this assay can detect is 250 copies / mL. A negative result does not preclude SARS-CoV-2 infection and should not be used as the sole basis for treatment or other patient management decisions.  A negative result may occur with improper specimen collection / handling, submission of specimen other than nasopharyngeal swab, presence of viral mutation(s) within the areas targeted by this assay, and inadequate number of viral copies (<250 copies / mL). A negative result must be combined with clinical observations, patient history, and epidemiological information.  Fact Sheet for Patients:   StrictlyIdeas.no  Fact Sheet for Healthcare Providers: BankingDealers.co.za  This test is not yet approved  or  cleared by the [place] FDA and has been authorized for detection and/or diagnosis of SARS-CoV-2 by FDA under an Emergency Use Authorization (EUA).  This EUA will remain in effect (meaning this test can be used) for the duration of the COVID-19 declaration under Section 564(b)(1) of the Act, 21 U.S.C. section 360bbb-3(b)(1), unless the authorization is terminated or revoked sooner.  Performed at [facility], [place] [address], [place], [place] [address]   Culture, Urine     Status: None   Collection Time: 03/30/20 11:50 AM   Specimen: Urine, Catheterized  Result Value Ref Range Status   Specimen Description   Final    URINE, CATHETERIZED Performed at [facility] [address] [place], [place] [address]    Special Requests   Final    NONE Performed at [facility], [place] [address], [place], [place] [address]    Culture   Final    NO GROWTH Performed at [facility], [place] [address] [place],  [address]    Report Status 03/31/2020 FINAL  Final         Radiology Studies: No results found.      Scheduled Meds:  (feeding supplement) PROSource Plus  30 mL Oral BID BM   bisacodyl  10 mg Rectal Daily   Chlorhexidine Gluconate Cloth  6 each Topical Daily   enoxaparin (LOVENOX) injection  40 mg Subcutaneous Q24H   feeding supplement  1 Container Oral TID BM   feeding supplement (ENSURE ENLIVE)  237 mL Oral TID WC   fluconazole  100 mg Oral Daily  guaiFENesin  1,200 mg Oral BID   lipase/protease/amylase  36,000 Units Oral TID AC   morphine  30 mg Oral Q8H   multivitamin with minerals  1 tablet Oral Daily   polyethylene glycol  17 g Oral Daily   predniSONE  20 mg Oral Q breakfast   senna-docusate  1 tablet Oral QHS   simethicone  160 mg Oral QID   Continuous Infusions:    LOS: 7 days    Time spent: 35 minutes    Irine Seal, MD [facility]   To contact  the attending provider between 7A-7P or the covering provider during after hours 7P-7A, please log into the web site [email or web address] and access using universal [place] password for that web site. If you do not have the password, please call the hospital operator.  04/05/2020, 12:32 PM

## 2020-04-05 NOTE — Progress Notes (Signed)
Daily Progress Note   Patient Name: Joe [name]       Date: 04/05/2020 DOB: 12/24/1960  Age: 59 y.o. MRN#: [id number] Attending Physician: Eugenie Filler, MD Primary Care Physician: Patient, No Pcp Per Admit Date: 03/28/2020  Reason for Consultation/Follow-up: Establishing goals of care and Pain control  Subjective/GOC: Patient sleeping this morning.   Discussed with RN. He walked yesterday and has been advanced to a soft diet. Discussed symptom management medications. He has had relief from liquid dilaudid but requiring IV during night shift. Encouraged RN to give liquid dilaudid today before IV. Will keep IV dilaudid on MAR for breakthrough. Encouraged RN to call PMT provider today if needed.   Length of Stay: 7  Current Medications: Scheduled Meds:  . (feeding supplement) PROSource Plus  30 mL Oral BID BM  . bisacodyl  10 mg Rectal Daily  . Chlorhexidine Gluconate Cloth  6 each Topical Daily  . enoxaparin (LOVENOX) injection  40 mg Subcutaneous Q24H  . feeding supplement  1 Container Oral TID BM  . feeding supplement (ENSURE ENLIVE)  237 mL Oral TID WC  . fluconazole  100 mg Oral Daily  . guaiFENesin  1,200 mg Oral BID  . lipase/protease/amylase  36,000 Units Oral TID AC  . morphine  30 mg Oral Q8H  . multivitamin with minerals  1 tablet Oral Daily  . polyethylene glycol  17 g Oral Daily  . predniSONE  20 mg Oral Q breakfast  . senna-docusate  1 tablet Oral QHS  . simethicone  160 mg Oral QID    Continuous Infusions: . magnesium sulfate bolus IVPB      PRN Meds: acetaminophen **OR** acetaminophen, bismuth subsalicylate, calcium carbonate, HYDROmorphone (DILAUDID) injection, HYDROmorphone HCl, LORazepam, metoprolol tartrate, ondansetron **OR** ondansetron (ZOFRAN)  IV  Physical Exam Vitals and nursing note reviewed.  Constitutional:      General: He is awake.     Appearance: He is cachectic. He is ill-appearing.  HENT:     Head: Normocephalic and atraumatic.  Pulmonary:     Effort: No tachypnea, accessory muscle usage or respiratory distress.  Skin:    General: Skin is warm and dry.  Neurological:     Mental Status: He is alert and oriented to person, place, and time.  Vital Signs: BP (!) 118/88 (BP Location: Left Arm)   Pulse (!) 118   Temp 97.8 F (36.6 C) (Oral)   Resp 18   Ht 6\' 1"  (1.854 m)   Wt 62.8 kg   SpO2 97%   BMI 18.27 kg/m  SpO2: SpO2: 97 % O2 Device: O2 Device: Room Air O2 Flow Rate:    Intake/output summary:   Intake/Output Summary (Last 24 hours) at 04/05/2020 0846 Last data filed at 04/04/2020 1251 Gross per 24 hour  Intake 1111.23 ml  Output --  Net 1111.23 ml   LBM: Last BM Date: 04/04/20 Baseline Weight: Weight: 63.5 kg Most recent weight: Weight: 62.8 kg       Palliative Assessment/Data: PPS 50%      Patient Active Problem List   Diagnosis Date Noted  . Palliative care by specialist   . Ileus ([place])   . Hypokalemia 03/30/2020  . Failure to thrive in adult 03/30/2020  . Severe protein-calorie malnutrition ([place]) 03/30/2020  . Intractable nausea and vomiting   . Dehydration 03/29/2020  . Goals of care, counseling/discussion 03/09/2020  . Malignant neoplasm of head of pancreas ([place]) 03/09/2020  . Pancreatic cancer ([place]) 02/29/2020  . Leukocytosis 02/29/2020  . Abdominal pain 02/28/2020  . Pancreatic mass 02/28/2020  . Weight loss 02/28/2020  . Liver lesion 02/28/2020  . Nausea and vomiting 02/28/2020  . Hyponatremia 02/28/2020  . Anemia 02/28/2020    Palliative Care Assessment & Plan   Patient Profile: 59 y.o. male  with past medical history of recent diagnosis of metastatic pancreatic cancer s/p completion of cycle 1 gemcitabine/Abrazane chemo admitted on 03/28/2020 with  abdominal pain, nausea, poor oral intake. CT abdomen and pelvis done showed nodules in the lung bases, increased size and number of liver metastases compared to 02/27/2020 CT, increased size of pancreatic mass, increased periaortic adenopathy, prostate enlarged, increased ascites, stable sclerotic bone lesions noted. Also concern for ileus versus bowel obstruction secondary to pancreatic cancer. Surgery following and recommending conservative management as patient is a poor surgical candidate. Tolerating full liquid diet. Palliative medicine consultation for goals of care.   Assessment: Intractable abdominal pain/nausea/vomiting Ileus vs. Obstruction Metastatic pancreatic cancer, stage IV Dehydration Constipation Severe protein calorie malnutrition Failure to thrive  Recommendations/Plan:  Initial GOC discussion with patient and wife 04/01/20. See note.   Patient desires ongoing FULL code/FULL scope treatment. A man of strong Christian faith. He believes God is in control, not science.   Continue current plan of care and medical management.  Patient/wife hopeful for improvement in his acute condition and symptoms.   Continue outpatient oncology follow-up. Patient/wife hopeful he will be a candidate for further chemotherapy.   Patient/family considering completion of AD packet. Patient wishes for daughter, Sherryl Barters to be documented HCPOA. Daughter aware and agrees with his decision.   May benefit from outpatient home palliative referral. [facility] team notified.  Symptom management  MS Contin 30mg  PO q8h scheduled for pain  Dilaudid 1.5mg  liquid PO q4h prn moderate pain  Continue IV Dilaudid prn breakthrough pain/severe pain  Bowel regimen per surgery  Continue prn IV ativan for anxiety  Continue scheduled prednisone for appetite   Goals of Care and Additional Recommendations:  Limitations on Scope of Treatment: Full Scope Treatment  Code Status: FULL   Code Status Orders    (From admission, onward)         Start     Ordered   03/29/20 0047  Full code  Continuous  03/29/20 0046        Code Status History    Date Active Date Inactive Code Status Order ID Comments User Context   02/29/2020 [id number] 03/01/2020 2201 Full Code [id number]  Norval Morton, MD ED   02/28/2020 0104 02/28/2020 2338 Full Code [id number]  Arlan Organ, DO ED   Advance Care Planning Activity       Prognosis:   Unable to determine: guarded, poor long-term  Discharge Planning:  To Be Determined  Care plan was discussed with RN  Thank you for allowing the Palliative Medicine Team to assist in the care of this patient.   Total Time 15 Prolonged Time Billed  no   Greater than 50% of this time was spent counseling and coordinating care related to the above assessment and plan.   Ihor Dow, DNP, FNP-C Palliative Medicine Team  Phone: [phone] Fax: [phone]  Please contact Palliative Medicine Team phone at [phone] for questions and concerns.

## 2020-04-05 NOTE — Progress Notes (Signed)
Initial Nutrition Assessment  DOCUMENTATION CODES:   Underweight  INTERVENTION:   -Boost Breeze TID, each supplement provides 250 kcal and 9 grams of protein. -D/c Prosource Plus  -D/c Ensure Enlive -multivitamin with minerals/day.  NUTRITION DIAGNOSIS:   Inadequate oral intake related to nausea, poor appetite, vomiting as evidenced by per patient/family report.  Improving.  GOAL:   Patient will meet greater than or equal to 90% of their needs  Progressing  MONITOR:   PO intake, Supplement acceptance, Diet advancement, Labs, Weight trends  ASSESSMENT:   59 y.o. male with medical history of metastatic pancreatic cancer with associated recurrent N/V, recently started on chemotherapy (a few days PTA). He presented to the ED d/t worsening abdominal pain, N/V, poor/no appetite with poor PO intakes, dizziness, and lightheadedness. In the ED he was noted to be severely dehydrated.  Patient eating but not drinking most supplements ordered. Exact percentages note available in flowsheets. Will d/c Ensure and Prosource.   Admission weight: 138 lbs. No new weights for admission.  Medications: Creon, Multivitamin with minerals daily, Miralax, Senokot Labs reviewed: Low Na  Diet Order:   Diet Order            DIET SOFT Room service appropriate? Yes; Fluid consistency: Thin  Diet effective now                 EDUCATION NEEDS:   Not appropriate for education at this time  Skin:  Skin Assessment: Reviewed RN Assessment  Last BM:  7/26  Height:   Ht Readings from Last 1 Encounters:  03/29/20 6\' 1"  (1.854 m)    Weight:   Wt Readings from Last 1 Encounters:  03/29/20 62.8 kg    BMI:  Body mass index is 18.27 kg/m.  Estimated Nutritional Needs:   Kcal:  2000-2200 kcal  Protein:  105-115 grams  Fluid:  >/= 2.2 L/day  Clayton Bibles, MS, RD, LDN Inpatient Clinical Dietitian Contact information available via Amion

## 2020-04-05 NOTE — Progress Notes (Signed)
[facility]) Outpatient Palliative Care  Referral received for outpatient palliative care once pt d/c's from the hospital.   [facility] will follow for d/c disposition and begin services once he is outpatient.  Venia Carbon RN, BSN, [facility] Liaison (in [place])

## 2020-04-05 NOTE — Progress Notes (Addendum)
IP PROGRESS NOTE  Subjective:  Pain significantly better this morning.  Denies nausea and vomiting.  He is not having any further difficulty moving his bowels.  He is sitting up eating breakfast at the time my visit.  Objective: Vital signs in last 24 hours: Blood pressure (!) 118/88, pulse (!) 118, temperature 97.8 F (36.6 C), temperature source Oral, resp. rate 18, height 6\' 1"  (1.854 m), weight 62.8 kg, SpO2 97 %.  Intake/Output from previous day: 07/26 0701 - 07/27 0700 In: 1231.2 [P.O.:240; I.V.:991.2] Out: -   Physical Exam:  HEENT: Thrush improved Lungs: Clear bilaterally, no respiratory distress Cardiac: Regular rate and rhythm, tachycardia Abdomen: Mildly distended, no mass, tender in the mid upper abdomen Extremities: Pitting edema in the lower legs and feet bilaterally Portacath/PICC-without erythema  Lab Results: Recent Labs    04/04/20 0839 04/05/20 0539  WBC 10.5 10.9*  HGB 8.1* 8.4*  HCT 22.1* 23.2*  PLT 327 446*    BMET Recent Labs    04/04/20 0541 04/05/20 0539  NA 132* 133*  K 4.1 4.1  CL 99 98  CO2 27 26  GLUCOSE 120* 103*  BUN 7 9  CREATININE 0.35* 0.35*  CALCIUM 8.4* 9.1    CT images reviewed Medications: I have reviewed the patient's current medications.  Assessment/Plan: 1. Pancreas cancer-stage IV ? CT abdomen/pelvis 01/22/2020-multiple hypoattenuating/hypoenhancing liver lesions-indeterminate ? CT abdomen/pelvis 02/27/2020-ill-defined pancreas head mass, new and enlarging extensive hepatic metastases, retroperitoneal adenopathy ? MRI abdomen 02/28/2020-mass at the junction and head of the pancreas body, no vascular invasion, multiple hepatic metastases, retroperitoneal adenopathy ? Ultrasound-guided biopsy of a right liver lesion 03/01/2020-poorly differentiated carcinoma with necrosis, cytokeratin 7, cytokeratin 20, and GATA-3 positive ? Cycle 1 gemcitabine/Abraxane 03/25/2020 ? CT abdomen/pelvis 03/28/2020-increased pancreas mass,  increased size and number of liver metastases, progressive ascites, stable sclerotic bone lesions at the sacrum, right acetabulum, and proximal right femur, increased periaortic lymphadenopathy 2. Pain secondary to #1 3. Family history of cancer-father with adrenal cancer 4. Constipation-likely secondary to narcotic analgesics 5. Hypercalcemia 03/15/2020. Zometa 03/16/2020.  6. Admission 03/29/2020 with abdominal pain, nausea, and dehydration   Mr. Viney appears improved.  Pain is much better controlled at this time.  Tolerating diet.  He is now day 12 following cycle 1 of gemcitabine/Abraxane.  It is too early to expect a response to chemotherapy.  Appetite improving with addition of prednisone.  Ritta Slot looks much better today.  Recommendations: 1.  Continue MS Contin and Dilaudid 2.  Continue as needed antiemetics 3.  Continue prednisone to 20 mg daily. 4.  Recommend continuation of fluconazole 100 mg daily for a total of 7 days.  The patient may discharge to home from our standpoint when medically stable.  He is already scheduled for outpatient follow-up at the cancer center on 04/08/2020.   LOS: 7 days   [name], NP   04/05/2020, 12:50 PM Mr. Apo was interviewed and examined.  His pain appears to be adequately controlled with the current narcotic regimen.  He is stable for discharge from an oncology standpoint.  He is scheduled for follow-up at the cancer center on 04/08/2020.

## 2020-04-06 DIAGNOSIS — R1084 Generalized abdominal pain: Secondary | ICD-10-CM | POA: Diagnosis not present

## 2020-04-06 DIAGNOSIS — E860 Dehydration: Secondary | ICD-10-CM | POA: Diagnosis not present

## 2020-04-06 DIAGNOSIS — D649 Anemia, unspecified: Secondary | ICD-10-CM | POA: Diagnosis not present

## 2020-04-06 DIAGNOSIS — G893 Neoplasm related pain (acute) (chronic): Secondary | ICD-10-CM | POA: Diagnosis not present

## 2020-04-06 LAB — BASIC METABOLIC PANEL
Anion gap: 5 (ref 5–15)
BUN: 10 mg/dL (ref 6–20)
CO2: 29 mmol/L (ref 22–32)
Calcium: 9.2 mg/dL (ref 8.9–10.3)
Chloride: 98 mmol/L (ref 98–111)
Creatinine, Ser: 0.39 mg/dL — ABNORMAL LOW (ref 0.61–1.24)
GFR calc Af Amer: >60 mL/min (ref >60)
GFR calc non Af Amer: >60 mL/min (ref >60)
Glucose, Bld: 120 mg/dL — ABNORMAL HIGH (ref 70–99)
Potassium: 4.3 mmol/L (ref 3.5–5.1)
Sodium: 132 mmol/L — ABNORMAL LOW (ref 135–145)

## 2020-04-06 LAB — MAGNESIUM: Magnesium: 2 mg/dL (ref 1.7–2.4)

## 2020-04-06 NOTE — Progress Notes (Signed)
   04/05/20 2138  Vitals  Temp 98.6 F (37 C)  Temp Source Oral  BP (!) 132/92  MAP (mmHg) 105  BP Location Left Arm  BP Method Automatic  Patient Position (if appropriate) Sitting  Pulse Rate (!) 124  Pulse Rate Source Monitor  Resp 16  MEWS COLOR  MEWS Score Color Yellow  Oxygen Therapy  SpO2 98 %  O2 Device Room Air  MEWS Score  MEWS Temp 0  MEWS Systolic 0  MEWS Pulse 2  MEWS RR 0  MEWS LOC 0  MEWS Score 2  Not an acute change. Patient currently metoprolol PRN

## 2020-04-06 NOTE — TOC Initial Note (Signed)
Transition of Care [facility]) - Initial/Assessment Note    Patient Details  Name: Joe [name] MRN: [id number] Date of Birth: [birth year]  Transition of Care [facility]) CM/SW Contact:    Lynnell Catalan, RN Phone Number: 04/06/2020, 10:47 AM  Clinical Narrative:                 This CM met with pt at bedside for dc planning. Pt was observed walking the whole length of the unit with a RW. Pt states that he has a RW and BSC at home currently. This CM spoke with pt about home palliative services and pt requested referral to [facility] for outpatient palliative follow up. This CM was unable to secure home health services due to pt insurance. Pt states that he is able to do Outpatient physical therapy. This CM sent referral to [facility] for outpatient PT.   Expected Discharge Plan: [place] (Home with outpatient palliative services) Barriers to Discharge: No Barriers Identified   Patient Goals and CMS Choice Patient states their goals for this hospitalization and ongoing recovery are:: To get moving and feel better      Expected Discharge Plan and Services Expected Discharge Plan: [place] (Home with outpatient palliative services)   Discharge Planning Services: CM Consult   Living arrangements for the past 2 months: [place] Arranged: Disease Management [place] Agency: Hospice and [place] Date [facility] Agency Contacted: 04/05/20 Time HH Agency Contacted: 1100 Representative spoke with at [place]: Anderson [place]  Prior Living Arrangements/Services Living arrangements for the past 2 months: Single Family Home Lives with:: Spouse Patient language and need for interpreter reviewed:: Yes Do you feel safe going back to the place where you live?: Yes      Need for Family Participation in Patient Care: Yes (Comment) Care giver support system in place?: Yes (comment)   Criminal Activity/Legal Involvement  Pertinent to Current Situation/Hospitalization: No - Comment as needed  Activities of Daily Living Home Assistive Devices/Equipment: None ADL Screening (condition at time of admission) Patient's cognitive ability adequate to safely complete daily activities?: Yes Is the patient deaf or have difficulty hearing?: No Does the patient have difficulty seeing, even when wearing glasses/contacts?: No Does the patient have difficulty concentrating, remembering, or making decisions?: No Patient able to express need for assistance with ADLs?: Yes Does the patient have difficulty dressing or bathing?: No Independently performs ADLs?: Yes (appropriate for developmental age) Does the patient have difficulty walking or climbing stairs?: No Weakness of Legs: None Weakness of Arms/Hands: None  Permission Sought/Granted Permission sought to share information with : Chartered certified accountant granted to share information with : Yes, Verbal Permission Granted     Permission granted to share info w AGENCY: [facility]        Emotional Assessment Appearance:: Appears older than stated age Attitude/Demeanor/Rapport: Guarded Affect (typically observed): Calm Orientation: : Oriented to Self, Oriented to Place, Oriented to  Time, Oriented to Situation      Admission diagnosis:  Dehydration [E86.0] Patient Active Problem List   Diagnosis Date Noted  . Cancer related pain   . Palliative care by specialist   . Ileus ([place])   . Hypokalemia 03/30/2020  . Failure to thrive in adult 03/30/2020  . Severe protein-calorie malnutrition ([place]) 03/30/2020  . Intractable nausea  and vomiting   . Dehydration 03/29/2020  . Goals of care, counseling/discussion 03/09/2020  . Malignant neoplasm of head of pancreas ([place]) 03/09/2020  . Pancreatic cancer ([place]) 02/29/2020  . Leukocytosis 02/29/2020  . Abdominal pain 02/28/2020  . Pancreatic mass 02/28/2020  . Weight loss 02/28/2020  . Liver lesion  02/28/2020  . Nausea and vomiting 02/28/2020  . Hyponatremia 02/28/2020  . Anemia 02/28/2020   PCP:  Patient, No Pcp Per Pharmacy:   [id number] [place] [place] [address]Phone: [phone]: [phone]    Social Determinants of Health (SDOH) Interventions    Readmission Risk Interventions Readmission Risk Prevention Plan 04/06/2020  Transportation Screening Complete  Medication Review ([place] Complete  PCP or Specialist appointment within 3-5 days of discharge Complete  HRI or [place]  SW Recovery Care/Counseling Consult Complete  Palliative Care Screening Complete  [place] Applicable  Some recent data might be hidden

## 2020-04-06 NOTE — Progress Notes (Signed)
OT Cancellation Note  Patient Details Name: Joe [name] MRN: [id number] DOB: 1961-04-12   Cancelled Treatment:    Reason Eval/Treat Not Completed: Patient declined, no reason specified. Attempt to see patient this AM however reports just getting some rest. Will re-attempt as schedule permits.  Delbert Phenix OT Pager: [place] 04/06/2020, 10:27 AM

## 2020-04-06 NOTE — Progress Notes (Signed)
PROGRESS NOTE  Joe [name] PXT:[id number] DOB: Sep 19, 1960 DOA: 03/28/2020 PCP: Patient, No Pcp Per  Brief History   Patient is an unfortunate 59 year old gentleman history of recently diagnosed metastatic prostate cancer status post completion of cycle 1 gemcitabine/Abraxane chemotherapy on 03/25/2020 presented to the ED with worsening abdominal pain, nausea, poor oral intake, noted to be dehydrated.  CT abdomen and pelvis done showed nodules in the lung bases, increased size and number of liver metastases compared to 02/27/2020 CT, increased size of pancreatic mass, increased periaortic adenopathy, prostate enlarged, increased ascites, stable sclerotic bone lesions noted.  Patient placed back on MS Contin twice daily and fentanyl initially.  Oncology consulted and informed of patient's admission.  Pain medications being adjusted for better control.  Palliative care consulted.  Consultants  . Palliative care. . Oncology . General surgery  Procedures  . None  Antibiotics   Anti-infectives (From admission, onward)   Start     Dose/Rate Route Frequency Ordered Stop   04/04/20 1000  fluconazole (DIFLUCAN) tablet 100 mg     Discontinue     100 mg Oral Daily 04/04/20 0936      .   Subjective  The patient is resting comfortably. He is again having trouble with eating due to his severe GERD and his obstruction due to problems with his diet yesterday.  Objective   Vitals:  Vitals:   04/06/20 0615 04/06/20 0948  BP: (!) 130/95 126/80  Pulse: (!) 121 (!) 120  Resp: 16 18  Temp: 98.4 F (36.9 C) 98.3 F (36.8 C)  SpO2: 95% 94%   Exam:  Constitutional:  . The patient is awake, alert, and oriented x 3. No acute distress. Respiratory:  . No increased work of breathing. . No wheezes, rales, or rhonchi . No tactile fremitus Cardiovascular:  . Regular rate and rhythm . No murmurs, ectopy, or gallups. . No lateral PMI. No thrills. Abdomen:  . Abdomen is soft, non-distended with  epigastric tenderness . No hernias, masses, or organomegaly . Hypoactive bowel sounds.  Musculoskeletal:  . No cyanosis, clubbing, or edema Skin:  . No rashes, lesions, ulcers . palpation of skin: no induration or nodules Neurologic:  . CN 2-12 intact . Sensation all 4 extremities intact Psychiatric:  . Mental status o Mood, affect appropriate o Orientation to person, place, time  . judgment and insight appear intact   I have personally reviewed the following:   Today's Data  . Vitals, BMP, Glucoses  Imaging  . CT abdomen and pelvis with contrast.  Scheduled Meds: . bisacodyl  10 mg Rectal Daily  . Chlorhexidine Gluconate Cloth  6 each Topical Daily  . enoxaparin (LOVENOX) injection  40 mg Subcutaneous Q24H  . feeding supplement  1 Container Oral TID BM  . fluconazole  100 mg Oral Daily  . guaiFENesin  1,200 mg Oral BID  . lipase/protease/amylase  36,000 Units Oral TID AC  . morphine  30 mg Oral Q8H  . multivitamin with minerals  1 tablet Oral Daily  . polyethylene glycol  17 g Oral Daily  . predniSONE  20 mg Oral Q breakfast  . senna-docusate  1 tablet Oral QHS  . simethicone  160 mg Oral QID   Continuous Infusions:  Principal Problem:   Abdominal pain Active Problems:   Pancreatic mass   Nausea and vomiting   Anemia   Leukocytosis   Malignant neoplasm of head of pancreas (HCC)   Dehydration   Hypokalemia   Failure to thrive in  adult   Severe protein-calorie malnutrition (H[facility])   Intractable nausea and vomiting   Ileus ([place])   Palliative care by specialist   Cancer related pain   LOS: 8 days   A & P   Intractable abdominal pain/nausea/colonic ileus versus obstruction:  Secondary to advanced pancreatic head cancer versus concern for ileus versus bowel obstruction secondary to pancreatic cancer..  CT abdomen and pelvis which was done showed increased size and number of liver mets compared to CT from 02/27/2020 as well as increased size of pancreatic mass,  increased periaortic adenopathy, increased ascites, stable sclerotic bone lesions noted.  Patient with nausea complaining of feeling gassy.  Patient currently on simethicone 160 mg p.o. 4 times daily. Patient's home dose MS Contin was resumed and dose has been increased per oncology to 30 mg p.o. twice daily. Home dose oral oxycodone resumed and dose increased and changed to a liquid format per oncology.  IV morphine has been discontinued and patient currently on IV Dilaudid every 3 hours as needed for pain.  Patient also started on oral Dilaudid and dose being adjusted per palliative care. Patient with abdominal distention which has improved somewhat with improvement with abdominal tightness.  Abdominal pain currently controlled on current pain regimen.  Abdominal films ordered with gaseous distention of the transverse colon without definite evidence of obstruction or wall thickening.  Concern for possible ileus versus small bowel obstruction secondary to pancreatic cancer.  Patient seen in consultation by general surgery and recommended conservative treatment at this time as patient a poor candidate.  Patient with no vomiting.  Patient currently tolerating soft diet.  Patient with bowel movements.  No emesis.   Continue daily MiraLAX.  Keep potassium >4.  Keep magnesium > 2.  General surgery, oncology following and appreciate input and recommendations.  Palliative care following for pain management and goals of care.  Patient will need palliative care to follow on discharge.  Dehydration: Improved. Secondary to poor oral intake, nausea, concern for possible ileus. Diet has been advanced to a soft diet which he is tolerating. Supportive care.  Metastatic pancreatic cancer stage IV:  Status post cycle 1 gemcitabine/Abraxane chemotherapy 03/25/2020.  Oncology informed of patient's admission and are following.  Per oncology too early to expect a response from chemotherapy.  Palliative care consulted for goals of  care and patient wanting aggressive treatment at this time.  Palliative care helping with pain management.  Patient will need palliative care to follow on discharge.  Outpatient follow-up with oncology already scheduled for 04/08/2020.  Per oncology.    Constipation: Felt secondary to narcotic pain medication.  Patient however with multiple loose stools and refused MiraLAX initially.  MiraLAX was initially discontinued but has been resumed by general surgery.  Patient with abdominal distention which is slightly improved still with abdominal pain.  Abdominal films obtained with gaseous distention colon without definite evidence of obstruction or wall thickening.  Patient currently on a soft diet which is tolerating. Patient was seen in consultation by general surgery with concerns for possible colonic ileus versus partial small bowel obstruction.  General surgery recommending conservative treatment at this time as patient is a poor surgical candidate.  Patient having bowel movements.  Tolerating current diet.  Continue daily MiraLAX.  Follow.   Failure to thrive: Likely secondary to problem #3.  Tolerating soft diet.  Follow.   Severe protein calorie malnutrition: Likely secondary to problem #3.  Diet was advanced to a soft diet which patient is tolerating. Albumin level  2.4. Status post IV albumin x1 day. Continue nutritional supplementation.   Hypokalemia: Repleted. Potassium at 4.1. Magnesium at 1.7. Magnesium sulfate 4 g IV x1.  Follow.   Anemia of chronic disease, POA: Likely secondary to metastatic pancreatic cancer.  Patient with no overt bleeding.  Anemia panel with iron level of 11, TIBC of 85, ferritin of 1555, folate of 8.1, vitamin B12 of 2433.  H&H stable at 8.4.  Transfusion threshold hemoglobin < 7.  Leukocytosis: Resolved. Questionable etiology.  Patient afebrile.  Patient with no respiratory symptoms.  Chest x-ray with atelectasis versus infiltrate however doubt if infiltrate.   Leukocytosis trending down.  Urinalysis negative for any acute UTI.  No need for antibiotics at this time.  Follow.   Tachycardia: Secondary to abdominal pain.  EKG with a sinus tachycardia.  Tachycardia improving with pain management and hydration.  IV fluids have been saline lock.  IV Lopressor as needed.  Continue current pain management.  Supportive care.  Oral thrush: Continue Diflucan to complete a 7-day course.;  I have seen and examined this myself. I have spent 34 minutes in his evaluation and care.  DVT prophylaxis: Lovenox Code Status: Full Family Communication: Updated patient.  No family at bedside.   Disposition:   Status is: Inpatient    Dispo: The patient is from: Home  Anticipated d/c is to: Home with home health therapies.  To be determined.  Likely home.  Anticipated d/c date is: 04/06/2020.  Patient currently with metastatic pancreatic cancer, with significant abdominal pain, nausea, poor oral intake, dehydrated.  Concern for possible ileus versus partial small bowel obstruction.  Pain regimen being adjusted for better pain control.  Poor oral intake.  Diet to be advanced.  Currently not stable for discharge.  Hopefully once pain has been managed adequately could likely be discharged hopefully tomorrow.  Reina Wilton, DO [facility] Direct contact: see [email or web address]  7PM-7AM contact night coverage as above 04/06/2020, 7:32 PM  LOS: 8 days

## 2020-04-07 DIAGNOSIS — G893 Neoplasm related pain (acute) (chronic): Secondary | ICD-10-CM | POA: Diagnosis not present

## 2020-04-07 DIAGNOSIS — C250 Malignant neoplasm of head of pancreas: Secondary | ICD-10-CM | POA: Diagnosis not present

## 2020-04-07 DIAGNOSIS — R1084 Generalized abdominal pain: Secondary | ICD-10-CM | POA: Diagnosis not present

## 2020-04-07 DIAGNOSIS — Z515 Encounter for palliative care: Secondary | ICD-10-CM | POA: Diagnosis not present

## 2020-04-07 DIAGNOSIS — D649 Anemia, unspecified: Secondary | ICD-10-CM | POA: Diagnosis not present

## 2020-04-07 DIAGNOSIS — E860 Dehydration: Secondary | ICD-10-CM | POA: Diagnosis not present

## 2020-04-07 MED ORDER — HYDROMORPHONE HCL 1 MG/ML PO LIQD: 2.0000 mg | ORAL | 0 refills | Status: DC | PRN

## 2020-04-07 MED ORDER — PREDNISONE 20 MG PO TABS: 20.0000 mg | ORAL_TABLET | Freq: Every day | ORAL | 0 refills | Status: DC

## 2020-04-07 MED ORDER — BISACODYL 10 MG RE SUPP: 10.0000 mg | Freq: Every day | RECTAL | 0 refills | Status: DC

## 2020-04-07 MED ORDER — MORPHINE SULFATE ER 30 MG PO TBCR: 30.0000 mg | EXTENDED_RELEASE_TABLET | Freq: Three times a day (TID) | ORAL | 0 refills | Status: DC

## 2020-04-07 MED ORDER — SENNOSIDES-DOCUSATE SODIUM 8.6-50 MG PO TABS: 1.0000 | ORAL_TABLET | Freq: Every day | ORAL | 0 refills | Status: DC

## 2020-04-07 MED ORDER — POLYETHYLENE GLYCOL 3350 17 G PO PACK: 17.0000 g | PACK | Freq: Every day | ORAL | 0 refills | Status: DC

## 2020-04-07 MED ORDER — SIMETHICONE 80 MG PO CHEW: 160.0000 mg | CHEWABLE_TABLET | Freq: Four times a day (QID) | ORAL | 0 refills | Status: DC

## 2020-04-07 MED ORDER — ADULT MULTIVITAMIN W/MINERALS CH: 1.0000 | ORAL_TABLET | Freq: Every day | ORAL | 0 refills | Status: DC

## 2020-04-07 MED ORDER — PANCRELIPASE (LIP-PROT-AMYL) 36000-114000 UNITS PO CPEP: 36000.0000 [IU] | ORAL_CAPSULE | Freq: Three times a day (TID) | ORAL | 0 refills | Status: DC

## 2020-04-07 MED ORDER — FLUCONAZOLE 100 MG PO TABS: 100.0000 mg | ORAL_TABLET | Freq: Every day | ORAL | 0 refills | Status: DC

## 2020-04-07 MED ORDER — HEPARIN SOD (PORK) LOCK FLUSH 100 UNIT/ML IV SOLN
500.0000 [IU] | Freq: Once | INTRAVENOUS | Status: AC
  Administered 2020-04-07: 500 [IU] via INTRAVENOUS

## 2020-04-07 MED ORDER — HYDROMORPHONE HCL 1 MG/ML PO LIQD
2.0000 mg | ORAL | Status: DC | PRN
  Administered 2020-04-07: 3 mg via ORAL
  Filled 2020-04-07: qty 4

## 2020-04-07 NOTE — Progress Notes (Signed)
Occupational Therapy Treatment Patient Details Name: Joe [name] MRN: [id number] DOB: Apr 12, 1961 Today's Date: 04/07/2020    History of present illness 59 y.o. male  with past medical history of recent diagnosis of metastatic pancreatic cancer s/p completion of cycle 1 gemcitabine/Abrazane chemo admitted on 03/28/2020 with abdominal pain, nausea, poor oral intake. CT abdomen and pelvis done showed nodules in the lung bases, increased size and number of liver metastases compared to 02/27/2020 CT, increased size of pancreatic mass, increased periaortic adenopathy, prostate enlarged, increased ascites, stable sclerotic bone lesions noted. Also concern for ileus versus bowel obstruction secondary to pancreatic cancer. Surgery following and recommending conservative management as patient is a poor surgical candidate.   OT comments  Treatment focused on educating patient on compensatory strategies for ADLs using ADL kit as he reports he may not have assistance at home as well as improving activity tolerance needed for ADLs. Patient able to transfer out of bed and ambulate in hallway 200 feet with RW and min guard from therapist with minimal complaints of fatigue. Patient will need reiteration of ADL kit as his persistent verbosity limits his retention of information. Cont POC   Follow Up Recommendations  No OT follow up;Supervision - Intermittent    Equipment Recommendations  Other (comment) (AE)    Recommendations for Other Services      Precautions / Restrictions Precautions Precautions: Fall Restrictions Weight Bearing Restrictions: No       Mobility Bed Mobility               General bed mobility comments: Supervision to transfer to side of bed.  Transfers                 General transfer comment: SBA for patient to stand and ambulate 200 feet in hallway. Patient reports mild fatigue after ambulation. Neede assistance to elevate legs in recliner.    Balance  Overall balance assessment: No apparent balance deficits (not formally assessed)                                         ADL either performed or assessed with clinical judgement   ADL                                         General ADL Comments: Patient educated on how to use AE For lower body ADLs. Patient verbalizes understanding. However, patient exhibits significant verbosity with tangential conversation throughout treatment and therapist questions patient's ability to recall information. During education in regards to ADLs patient reports "I think I can manage pulling my socks up" though would not demonstrate. Patient reports knowing how to use a shoe horn. Patient reports having a long handled reacher at home - though unsure of it's location. Patient verbalizes understanding of long handled sponge and then discusses use of BSC in shower.     Vision   Vision Assessment?: No apparent visual deficits   Perception     Praxis      Cognition Arousal/Alertness: Awake/alert Behavior During Therapy: WFL for tasks assessed/performed Overall Cognitive Status: Within Functional Limits for tasks assessed                                 General  Comments: Patient functional and pleasant during evaluation. Patient hard to keep on task due to verbosity. Needed verbal cues to reign in conversation back to mobility and ADLs.        Exercises     Shoulder Instructions       General Comments      Pertinent Vitals/ Pain       Pain Assessment: Faces Faces Pain Scale: Hurts a little bit Pain Location: back; abdomen Pain Descriptors / Indicators: Grimacing Pain Intervention(s): Monitored during session  Home Living                                          Prior Functioning/Environment              Frequency  Min 2X/week        Progress Toward Goals  OT Goals(current goals can now be found in the care plan  section)  Progress towards OT goals: Progressing toward goals  Acute Rehab OT Goals Patient Stated Goal: to eat real food Time For Goal Achievement: 04/18/20 Potential to Achieve Goals: Good  Plan Discharge plan remains appropriate    Co-evaluation                 AM-PAC OT "6 Clicks" Daily Activity     Outcome Measure   Help from another person eating meals?: None Help from another person taking care of personal grooming?: A Little Help from another person toileting, which includes using toliet, bedpan, or urinal?: A Little Help from another person bathing (including washing, rinsing, drying)?: A Little Help from another person to put on and taking off regular upper body clothing?: A Little Help from another person to put on and taking off regular lower body clothing?: A Little 6 Click Score: 19    End of Session Equipment Utilized During Treatment: Gait belt  OT Visit Diagnosis: Pain;Muscle weakness (generalized) (M62.81)   Activity Tolerance Patient tolerated treatment well   Patient Left in chair;with call bell/phone within reach   Nurse Communication Mobility status        Time: 0684-0335 OT Time Calculation (min): 49 min  Charges: OT General Charges $OT Visit: 1 Visit OT Treatments $Self Care/Home Management : 23-37 mins $Therapeutic Activity: 8-22 mins  [name], OTR/L [place]  Office [phone] Pager: [phone]    [name] 04/07/2020, 3:02 PM

## 2020-04-07 NOTE — Progress Notes (Signed)
Daily Progress Note   Patient Name: Joe [name]       Date: 04/07/2020 DOB: 05/14/1961  Age: 59 y.o. MRN#: [id number] Attending Physician: Karie Kirks, DO Primary Care Physician: Patient, No Pcp Per Admit Date: 03/28/2020  Reason for Consultation/Follow-up: Establishing goals of care and Pain control  Subjective/GOC: I saw and examined Joe [name] today.  He was sitting in bed in no distress.  We discussed his pain management.  He reports that his pain is better controlled overall than when he first arrived to the hospital.  At the same time, he states that he wishes that he had better relief from oral Dilaudid whenever he takes it.  Prior to taking oral Dilaudid he will rate his pain is 8-1/2 out of 10.  After taking Dilaudid, he reports that it goes down to "about a 6."  We discussed plan for pain management on discharge and consideration for increasing his rescue dose of medication.  He is agreeable to this.  Length of Stay: 9  Current Medications: Scheduled Meds:  . bisacodyl  10 mg Rectal Daily  . Chlorhexidine Gluconate Cloth  6 each Topical Daily  . enoxaparin (LOVENOX) injection  40 mg Subcutaneous Q24H  . feeding supplement  1 Container Oral TID BM  . fluconazole  100 mg Oral Daily  . guaiFENesin  1,200 mg Oral BID  . lipase/protease/amylase  36,000 Units Oral TID AC  . morphine  30 mg Oral Q8H  . multivitamin with minerals  1 tablet Oral Daily  . polyethylene glycol  17 g Oral Daily  . predniSONE  20 mg Oral Q breakfast  . senna-docusate  1 tablet Oral QHS  . simethicone  160 mg Oral QID    Continuous Infusions:   PRN Meds: acetaminophen **OR** acetaminophen, bismuth subsalicylate, calcium carbonate, HYDROmorphone HCl, LORazepam, metoprolol tartrate, ondansetron  **OR** ondansetron (ZOFRAN) IV  Physical Exam Vitals and nursing note reviewed.  Constitutional:      General: He is awake.     Appearance: He is cachectic. He is ill-appearing.  HENT:     Head: Normocephalic and atraumatic.  Pulmonary:     Effort: No tachypnea, accessory muscle usage or respiratory distress.  Skin:    General: Skin is warm and dry.  Neurological:     Mental Status: He is alert  and oriented to person, place, and time.            Vital Signs: BP 128/85 (BP Location: Right Arm)   Pulse (!) 128   Temp 98.4 F (36.9 C) (Oral)   Resp 20   Ht 6\' 1"  (1.854 m)   Wt 62.8 kg   SpO2 97%   BMI 18.27 kg/m  SpO2: SpO2: 97 % O2 Device: O2 Device: Room Air O2 Flow Rate:    Intake/output summary:   Intake/Output Summary (Last 24 hours) at 04/07/2020 1059 Last data filed at 04/07/2020 4081 Gross per 24 hour  Intake --  Output 400 ml  Net -400 ml   LBM: Last BM Date: 04/05/20 Baseline Weight: Weight: 63.5 kg Most recent weight: Weight: 62.8 kg       Palliative Assessment/Data: PPS 50%      Patient Active Problem List   Diagnosis Date Noted  . Cancer related pain   . Palliative care by specialist   . Ileus ([place])   . Hypokalemia 03/30/2020  . Failure to thrive in adult 03/30/2020  . Severe protein-calorie malnutrition ([place]) 03/30/2020  . Intractable nausea and vomiting   . Dehydration 03/29/2020  . Goals of care, counseling/discussion 03/09/2020  . Malignant neoplasm of head of pancreas ([place]) 03/09/2020  . Pancreatic cancer ([place]) 02/29/2020  . Leukocytosis 02/29/2020  . Abdominal pain 02/28/2020  . Pancreatic mass 02/28/2020  . Weight loss 02/28/2020  . Liver lesion 02/28/2020  . Nausea and vomiting 02/28/2020  . Hyponatremia 02/28/2020  . Anemia 02/28/2020    Palliative Care Assessment & Plan   Patient Profile: 59 y.o. male  with past medical history of recent diagnosis of metastatic pancreatic cancer s/p completion of cycle 1  gemcitabine/Abrazane chemo admitted on 03/28/2020 with abdominal pain, nausea, poor oral intake. CT abdomen and pelvis done showed nodules in the lung bases, increased size and number of liver metastases compared to 02/27/2020 CT, increased size of pancreatic mass, increased periaortic adenopathy, prostate enlarged, increased ascites, stable sclerotic bone lesions noted. Also concern for ileus versus bowel obstruction secondary to pancreatic cancer. Surgery following and recommending conservative management as patient is a poor surgical candidate. Tolerating full liquid diet. Palliative medicine consultation for goals of care.   Assessment: Intractable abdominal pain/nausea/vomiting Ileus vs. Obstruction Metastatic pancreatic cancer, stage IV Dehydration Constipation Severe protein calorie malnutrition Failure to thrive  Recommendations/Plan: -Pain: MAR reviewed.  In the last 24 hours, he has had a total oral morphine equivalent of approximately 142 mg of oral morphine.  This included MS Contin 30 mg x 3 doses, IV Dilaudid 1 mg x 2 doses, and oral Dilaudid 1.5 mg x 2 doses.  He reports that oral rescue medication is helpful, but does not sufficiently relieve his pain.  Based upon his overall all 24-hour oral morphine equivalent, he would probably benefit from increasing rescue dose of medication.  I do think he could still be discharged today if otherwise medically stable, however, I recommend discharge regimen of MS Contin 30 mg 3 times daily with oral Dilaudid 2 to 4 mg every 4 hours as needed for breakthrough pain.  As he is currently hurting, I asked nurse to give him an oral dose of 4 mg of Dilaudid to see if this does better to relieve his pain.  If he does well with this, I would continue to recommend this regimen for discharge. -We also discussed management strategies for his pain at home.  This includes pretreating prior to activities that  cause him discomfort.  Also discussed the importance of  maintaining bowel regimen with chronic narcotic usage.  Goals of Care and Additional Recommendations:  Limitations on Scope of Treatment: Full Scope Treatment  Code Status: FULL   Code Status Orders  (From admission, onward)         Start     Ordered   03/29/20 0047  Full code  Continuous        03/29/20 0046        Code Status History    Date Active Date Inactive Code Status Order ID Comments User Context   02/29/2020 1614 03/01/2020 2201 Full Code [id number]  Norval Morton, MD ED   02/28/2020 0104 02/28/2020 2338 Full Code [id number]  Arlan Organ, DO ED   Advance Care Planning Activity       Prognosis:   Unable to determine: guarded, poor long-term  Discharge Planning:  To Be Determined  Care plan was discussed with RN  Thank you for allowing the Palliative Medicine Team to assist in the care of this patient.  Start [id number] End time: 10:00 Total Time 35 Prolonged Time Billed  no   Greater than 50%  of this time was spent counseling and coordinating care related to the above assessment and plan.  Micheline Rough, MD [facility] Team [phone]   Please contact Palliative Medicine Team phone at [phone] for questions and concerns.

## 2020-04-07 NOTE — Discharge Summary (Signed)
Physician Discharge Summary  LOUKAS ANTONSON ZJI:[id number] DOB: 1961-06-02 DOA: 03/28/2020  PCP: Patient, No Pcp Per  Admit date: 03/28/2020 Discharge date: 04/07/2020  Recommendations for Outpatient Follow-up:  1. Discharge to home with palliative care, home health PT/OT 2. Follow up with PCP in 7-10 days. 3. Follow up with oncology as directed. Keep appointments.   Follow-up [facility].   Why: for outpatient physical therapy.  [address] [place] [address]  [phone]       [facility].   Specialty: PALLIATIVE CARE Why: For outpatient palliative services. Contact information: [place] [phone]             Discharge Diagnoses: Principal diagnosis is #1 1. Intractable abdominal pain/nausea/vomiting 2. Advanced pancreatic cancer 3. Dehydration 4. Constipation due to opiates 5. Failure to thrive 6. Severe protein calorie malnutrition 7. Hypokalemia  Discharge Condition: Fair  Disposition: Home  Diet recommendation: Soft diet, regular  Filed Weights   03/28/20 1405 03/29/20 0537  Weight: 63.5 kg 62.8 kg   History of present illness:  Joe [name] is a 59 y.o. male with medical history significant of metastatic pancreatic cancer, recurrent nausea vomiting who was recently started on chemotherapy just a few days ago.  Patient has been on OxyContin and oxycodone for pain.  He started having nausea vomiting over the weekend.  Unable to tolerate most p.o.'s.  His appetite is gone.  He is having worsening abdominal pain with some dizziness and lightheadedness.  Denied any diarrhea.  He has no vomiting today but nausea.  No hematemesis no melena no bright red blood per rectum.  Patient was seen in the ER due to significant dehydration from poor oral intake.  Dr Chriss Driver is his oncologist.  He is being admitted to the hospital for pain  management to be seen by oncology in the morning.  Patient otherwise hemodynamically stable at this point..  ED Course: Temperature 97.9 blood pressure 160/100 pulse 132 respirate of 29 oxygen sat 94% on room air.  White count is 43.5 hemoglobin 9.7 platelet count of 608.  Sodium 137 potassium 3.8 chloride 95 CO2 31 with glucose 156 BUN 20 creatinine 0.41 calcium 8.1.  Gap is 11 magnesium 2.3 phosphorus 2.2.  Alkaline phos were 264.  CT abdomen pelvis showed multiple findings consistent with known pancreatic cancer.  Oncology was consulted and recommended admission for pain management.  Hospital Course: Patient is an unfortunate 59 year old gentleman history of recently diagnosed metastatic prostate cancer status post completion of cycle 1 gemcitabine/Abraxane chemotherapy on 03/25/2020 presented to the ED with worsening abdominal pain, nausea, poor oral intake, noted to be dehydrated. CT abdomen and pelvis done showed nodules in the lung bases, increased size and number of liver metastases compared to 02/27/2020 CT, increased size of pancreatic mass, increased periaortic adenopathy, prostate enlarged, increased ascites, stable sclerotic bone lesions noted. Patient placed back on MS Contin twice daily and fentanyl initially. Oncology consulted and informed of patient's admission. Pain medications being adjusted for better control. Palliative care consulted. Dr. Domingo Cocking has adjusted the patient's Oral dilaudid and MS Contin to better control his pain.  The patient is improved, tolerating his diet. He will be discharged to home today with palliative care.  Today's assessment: S: The patient is resting comfortably.  O: Vitals:  Vitals:   04/07/20 0011 04/07/20 0933  BP: (!) 138/79 128/85  Pulse: (!) 115 (!) 128  Resp: 20 20  Temp: 97.7 F (36.5 C) 98.4 F (36.9 C)  SpO2: 92% 97%   Exam:  Constitutional:   The patient is awake, alert, and oriented x 3. No acute distress. Respiratory:    No increased work of breathing.  No wheezes, rales, or rhonchi  No tactile fremitus Cardiovascular:   Regular rate and rhythm  No murmurs, ectopy, or gallups.  No lateral PMI. No thrills. Abdomen:   Abdomen is soft, non-distended with epigastric tenderness  No hernias, masses, or organomegaly  Hypoactive bowel sounds.  Musculoskeletal:   No cyanosis, clubbing, or edema Skin:   No rashes, lesions, ulcers  palpation of skin: no induration or nodules Neurologic:   CN 2-12 intact  Sensation all 4 extremities intact Psychiatric:   Mental status ? Mood, affect appropriate ? Orientation to person, place, time   judgment and insight appear intact  Discharge Instructions  Discharge Instructions    Activity as tolerated - No restrictions   Complete by: As directed    Call MD for:  persistant nausea and vomiting   Complete by: As directed    Call MD for:  severe uncontrolled pain   Complete by: As directed    Diet general   Complete by: As directed    Soft   Discharge instructions   Complete by: As directed    Discharge to home with palliative care, home health PT/OT Follow up with PCP in 7-10 days. Follow up with oncology as directed. Keep appointments.   Increase activity slowly   Complete by: As directed      Allergies as of 04/07/2020      Reactions   Morphine And Related Other (See Comments)   Can't eat      Medication List    STOP taking these medications   sorbitol 70 % solution     TAKE these medications   ALKA-SELTZER ANTACID PO Take 1-2 tablets by mouth as needed (Indigestion).   bisacodyl 10 MG suppository Commonly known as: DULCOLAX Place 1 suppository (10 mg total) rectally daily. Start taking on: April 08, 2020   fluconazole 100 MG tablet Commonly known as: DIFLUCAN Take 1 tablet (100 mg total) by mouth daily for 3 days. Start taking on: April 08, 2020   HYDROmorphone HCl 1 MG/ML Liqd Commonly known as: DILAUDID Take 2-4 mLs  (2-4 mg total) by mouth every 4 (four) hours as needed for moderate pain or severe pain.   lipase/protease/amylase 36000 UNITS Cpep capsule Commonly known as: CREON Take 1 capsule (36,000 Units total) by mouth 3 (three) times daily before meals. What changed: See the new instructions.   morphine 30 MG 12 hr tablet Commonly known as: MS CONTIN Take 1 tablet (30 mg total) by mouth every 8 (eight) hours. What changed:   medication strength  how much to take  when to take this   multivitamin with minerals Tabs tablet Take 1 tablet by mouth daily. Start taking on: April 08, 2020   ondansetron 8 MG disintegrating tablet Commonly known as: Zofran ODT Take 1 tablet (8 mg total) by mouth every 8 (eight) hours as needed for nausea. 58m ODT q4 hours prn nausea   oxyCODONE 5 MG immediate release tablet Commonly known as: Roxicodone Take 1-2 every 4 hours as needed, up to 8 per day What changed:   how much to take  how to take this  when to take this  reasons to take this  additional instructions   Pepto-Bismol 262 MG/15ML  suspension Generic drug: bismuth subsalicylate Take 30 mLs by mouth daily as needed for diarrhea or loose stools.   polyethylene glycol 17 g packet Commonly known as: MIRALAX / GLYCOLAX Take 17 g by mouth daily. Start taking on: April 08, 2020   predniSONE 20 MG tablet Commonly known as: DELTASONE Take 1 tablet (20 mg total) by mouth daily with breakfast. Start taking on: April 08, 2020   promethazine 25 MG tablet Commonly known as: PHENERGAN Take 1 tablet (25 mg total) by mouth every 6 (six) hours as needed for nausea.   Rolaids 550-110 MG Chew Generic drug: Ca Carbonate-Mag Hydroxide Chew 1 tablet by mouth daily as needed (stomach acid).   senna-docusate 8.6-50 MG tablet Commonly known as: Senokot-S Take 1 tablet by mouth at bedtime. What changed: when to take this   simethicone 80 MG chewable tablet Commonly known as: MYLICON Chew 2 tablets  (160 mg total) by mouth 4 (four) times daily.      Allergies  Allergen Reactions  . Morphine And Related Other (See Comments)    Can't eat    The results of significant diagnostics from this hospitalization (including imaging, microbiology, ancillary and laboratory) are listed below for reference.    Significant Diagnostic Studies: DG Chest 2 View  Result Date: 03/30/2020 CLINICAL DATA:  Metastatic pancreatic cancer. EXAM: CHEST - 2 VIEW COMPARISON:  None. FINDINGS: Low volume film with right base atelectasis or infiltrate in small right pleural effusion. Right Port-A-Cath tip overlies the right atrium. The visualized bony structures of the thorax show now acute abnormality. IMPRESSION: Low volume film with right base atelectasis or infiltrate and small right pleural effusion. Electronically Signed   By: Misty Stanley M.D.   On: 03/30/2020 11:59   DG Abd 1 View  Result Date: 04/02/2020 CLINICAL DATA:  Follow-up ileus. Upper abdominal pain, nausea and loose stools. EXAM: ABDOMEN - 1 VIEW COMPARISON:  04/01/2020 FINDINGS: Again seen is gaseous distension of the transverse colon. The appearance is similar to the previous exam with abrupt cut off at the level of the proximal descending colon. No dilated small bowel loops No small bowel dilatation. Visualized osseous structures are unremarkable. IMPRESSION: Persistent gaseous distension of the transverse colon with abrupt cut off at the level of the proximal descending colon. Electronically Signed   By: Kerby Moors M.D.   On: 04/02/2020 10:49   DG Abd 1 View  Result Date: 04/01/2020 CLINICAL DATA:  Obstruction EXAM: ABDOMEN - 1 VIEW COMPARISON:  03/31/2020, 03/30/2020 FINDINGS: Gaseous distention of transverse and ascending colon. Abrupt cut off at proximal descending colon again seen. Cannot exclude obstruction at the splenic flexure. Opacity projecting over mid abdomen question retained contrast versus superimposed radiopacity. No small bowel  dilatation. Osseous structures unremarkable. Increased attenuation in the flanks suggesting ascites. IMPRESSION: Probable ascites. Abrupt cut off of gas-filled transverse colon at the splenic flexure, question colonic obstruction versus ileus. Electronically Signed   By: Lavonia Dana M.D.   On: 04/01/2020 09:15   DG Abd 1 View  Result Date: 03/31/2020 CLINICAL DATA:  Diffuse abdominal pain EXAM: ABDOMEN - 1 VIEW COMPARISON:  03/30/2020 FINDINGS: Gaseous distention of transverse colon. Small amount gas in rectum. Small bowel gas pattern normal. No bowel wall thickening. Osseous structures unremarkable. No urinary tract calcification. IMPRESSION: Mild gaseous distention of transverse colon without definite evidence of obstruction or wall thickening. Electronically Signed   By: Lavonia Dana M.D.   On: 03/31/2020 13:57   CT Abdomen Pelvis W Contrast  Result Date: 03/28/2020 CLINICAL DATA:  Suspected bowel obstruction. EXAM: CT ABDOMEN AND PELVIS WITH CONTRAST TECHNIQUE: Multidetector CT imaging of the abdomen and pelvis was performed using the standard protocol following bolus administration of intravenous contrast. CONTRAST:  130m OMNIPAQUE IOHEXOL 300 MG/ML  SOLN COMPARISON:  February 27, 2020 FINDINGS: Lower chest: Numerous 2 mm and 3 mm noncalcified lung nodules are seen within the bilateral lung bases. Hepatobiliary: Innumerable heterogeneous ill-defined low-attenuation liver lesions are seen scattered throughout the right and left lobes. These are increased in size and number when compared to the prior study. No gallstones, gallbladder wall thickening, or biliary dilatation. Pancreas: A 3.9 cm x 3.7 cm lobulated heterogeneous low-attenuation mass is seen within the head of the pancreas. This is increased in size when compared to the prior exam. Spleen: Normal in size without focal abnormality. Adrenals/Urinary Tract: Adrenal glands are unremarkable. Kidneys are normal, without renal calculi or hydronephrosis. A  1.6 cm cyst is seen within the lateral aspect of the mid right kidney. Bladder is unremarkable. Stomach/Bowel: Stomach is within normal limits. The appendix is not clearly identified. No evidence of bowel wall thickening, distention, or inflammatory changes. Vascular/Lymphatic: There is mild calcification of the abdominal aorta. Moderate severity para-aortic lymphadenopathy is seen which is increased in severity when compared to the prior study. Reproductive: There is moderate to marked severity prostate gland enlargement. Other: No abdominal wall hernia or abnormality. A moderate to marked amount of free fluid is seen within the abdomen and pelvis. This is markedly increased in severity when compared to the prior study. Musculoskeletal: A 0.9 cm sclerotic focus is seen within the mid to lower sacrum on the left. A 0.4 cm sclerotic focus is noted within the posteromedial aspect of the right acetabulum. An additional 0.8 cm sclerotic focus is seen within the proximal right femur. These areas are all stable in size and appearance when compared to the prior study. IMPRESSION: 1. Innumerable heterogeneous ill-defined low-attenuation liver lesions which are increased in size and number when compared to the prior study, consistent with worsening metastatic disease. 2. Interval increase in size of a lobulated heterogeneous low-attenuation mass within the head of the pancreas, consistent with primary pancreatic neoplasm. 3. Moderate severity para-aortic lymphadenopathy, increased in severity when compared to the prior study. 4. Numerous 2 mm and 3 mm noncalcified lung nodules within the bilateral lung bases. These findings are concerning for metastatic disease. 5. Moderate to marked severity prostate gland enlargement. 6. Stable sclerotic foci within the mid to lower sacrum on the left, posteromedial aspect of the right acetabulum, and proximal right femur. These areas are all stable in size and appearance when compared to  the prior study. 7. Moderate to marked amount of free fluid within the abdomen and pelvis which is markedly increased in severity when compared to the prior study. 8. Aortic atherosclerosis. Aortic Atherosclerosis (ICD10-I70.0). Electronically Signed   By: TVirgina NorfolkM.D.   On: 03/28/2020 20:24   DG Abd 2 Views  Result Date: 03/30/2020 CLINICAL DATA:  Metastatic pancreatic cancer with recurrent nausea and vomiting. EXAM: ABDOMEN - 2 VIEW COMPARISON:  CT scan 03/28/2020 FINDINGS: Two views study shows no intraperitoneal free air. Gaseous distention of small bowel and colon is evident. Centralization of bowel loops suggests ascites. No worrisome lytic or sclerotic osseous abnormality. IMPRESSION: Gaseous distention of small bowel and colon. Imaging features suggest ileus. No intraperitoneal free air Electronically Signed   By: EMisty StanleyM.D.   On: 03/30/2020 11:58   IR  IMAGING GUIDED PORT INSERTION  Result Date: 03/24/2020 INDICATION: Metastatic pancreatic cancer. In need durable intravenous access for chemotherapy administration. EXAM: IMPLANTED PORT A CATH PLACEMENT WITH ULTRASOUND AND FLUOROSCOPIC GUIDANCE COMPARISON:  None. MEDICATIONS: Ancef 2 gm IV; The antibiotic was administered within an appropriate time interval prior to skin puncture. ANESTHESIA/SEDATION: Moderate (conscious) sedation was employed during this procedure. A total of Versed 2 mg and Fentanyl 100 mcg was administered intravenously. Moderate Sedation Time: 23 minutes. The patient's level of consciousness and vital signs were monitored continuously by radiology nursing throughout the procedure under my direct supervision. CONTRAST:  None FLUOROSCOPY TIME:  12 seconds (2 mGy) COMPLICATIONS: None immediate. PROCEDURE: The procedure, risks, benefits, and alternatives were explained to the patient. Questions regarding the procedure were encouraged and answered. The patient understands and consents to the procedure. The right neck  and chest were prepped with chlorhexidine in a sterile fashion, and a sterile drape was applied covering the operative field. Maximum barrier sterile technique with sterile gowns and gloves were used for the procedure. A timeout was performed prior to the initiation of the procedure. Local anesthesia was provided with 1% lidocaine with epinephrine. After creating a small venotomy incision, a micropuncture kit was utilized to access the internal jugular vein. Real-time ultrasound guidance was utilized for vascular access including the acquisition of a permanent ultrasound image documenting patency of the accessed vessel. The microwire was utilized to measure appropriate catheter length. A subcutaneous port pocket was then created along the upper chest wall utilizing a combination of sharp and blunt dissection. The pocket was irrigated with sterile saline. A single lumen "Slim" sized power injectable port was chosen for placement. The 8 Fr catheter was tunneled from the port pocket site to the venotomy incision. The port was placed in the pocket. The external catheter was trimmed to appropriate length. At the venotomy, an 8 Fr peel-away sheath was placed over a guidewire under fluoroscopic guidance. The catheter was then placed through the sheath and the sheath was removed. Final catheter positioning was confirmed and documented with a fluoroscopic spot radiograph. The port was accessed with a Huber needle, aspirated and flushed with heparinized saline. The venotomy site was closed with an interrupted 4-0 Vicryl suture. The port pocket incision was closed with interrupted 2-0 Vicryl suture. The skin was opposed with a running subcuticular 4-0 Vicryl suture. Dermabond and Steri-strips were applied to both incisions. Dressings were applied. The patient tolerated the procedure well without immediate post procedural complication. FINDINGS: After catheter placement, the tip lies within the superior cavoatrial junction. The  catheter aspirates and flushes normally and is ready for immediate use. IMPRESSION: Successful placement of a right internal jugular approach power injectable Port-A-Cath. The catheter is ready for immediate use. Electronically Signed   By: Sandi Mariscal M.D.   On: 03/24/2020 15:47    Microbiology: Recent Results (from the past 240 hour(s))  SARS Coronavirus 2 by RT PCR (hospital order, performed in [facility] hospital lab) Nasopharyngeal Nasopharyngeal Swab     Status: None   Collection Time: 03/28/20  9:00 PM   Specimen: Nasopharyngeal Swab  Result Value Ref Range Status   SARS Coronavirus 2 NEGATIVE NEGATIVE Final    Comment: (NOTE) SARS-CoV-2 target nucleic acids are NOT DETECTED.  The SARS-CoV-2 RNA is generally detectable in upper and lower respiratory specimens during the acute phase of infection. The lowest concentration of SARS-CoV-2 viral copies this assay can detect is 250 copies / mL. A negative result does not preclude SARS-CoV-2  infection and should not be used as the sole basis for treatment or other patient management decisions.  A negative result may occur with improper specimen collection / handling, submission of specimen other than nasopharyngeal swab, presence of viral mutation(s) within the areas targeted by this assay, and inadequate number of viral copies (<250 copies / mL). A negative result must be combined with clinical observations, patient history, and epidemiological information.  Fact Sheet for Patients:   StrictlyIdeas.no  Fact Sheet for Healthcare Providers: BankingDealers.co.za  This test is not yet approved or  cleared by the [place] FDA and has been authorized for detection and/or diagnosis of SARS-CoV-2 by FDA under an Emergency Use Authorization (EUA).  This EUA will remain in effect (meaning this test can be used) for the duration of the COVID-19 declaration under Section 564(b)(1) of the Act,  21 U.S.C. section 360bbb-3(b)(1), unless the authorization is terminated or revoked sooner.  Performed at [facility], [place] [address], [place], [place] [address]   Culture, Urine     Status: None   Collection Time: 03/30/20 11:50 AM   Specimen: Urine, Catheterized  Result Value Ref Range Status   Specimen Description   Final    URINE, CATHETERIZED Performed at [facility] [address], [place], [place] [address]    Special Requests   Final    NONE Performed at [facility], [place] [address], [place], [place] [address]    Culture   Final    NO GROWTH Performed at [facility], [place] [address] [place], [place] [address]    Report Status 03/31/2020 FINAL  Final     Labs: Basic Metabolic Panel: Recent Labs  Lab 04/01/20 0709 04/02/20 0500 04/02/20 0659 04/03/20 0703 04/03/20 5638 04/04/20 0541 04/04/20 0839 04/05/20 0539 04/06/20 0623  NA  --  137  --   --  136 132*  --  133* 132*  K  --  3.7  --   --  3.8 4.1  --  4.1 4.3  CL  --  102  --   --  104 99  --  98 98  CO2  --  27  --   --  25 27  --  26 29  GLUCOSE  --  116*  --   --  158* 120*  --  103* 120*  BUN  --  10  --   --  8 7  --  9 10  CREATININE  --  0.39*  --   --  0.34* 0.35*  --  0.35* 0.39*  CALCIUM  --  8.2*  --   --  7.8* 8.4*  --  9.1 9.2  MG   < >  --  1.7 1.9  --   --  1.8 1.7 2.0  PHOS  --  2.0*  --   --  2.5 2.5  --   --   --    < > = values in this interval not displayed.   Liver Function Tests: Recent Labs  Lab 04/02/20 0500 04/03/20 0942 04/04/20 0541  ALBUMIN 2.6* 2.3* 2.4*   No results for input(s): LIPASE, AMYLASE in the last 168 hours. No results for input(s): AMMONIA in the last 168 hours. CBC: Recent Labs  Lab 04/01/20 0709 04/02/20 0659 04/03/20 0942 04/04/20 0839 04/05/20 0539  WBC 15.8* 11.6* 11.2* 10.5 10.9*  NEUTROABS  --  9.6* 8.9*  --   --   HGB  8.7* 8.1* 7.7* 8.1* 8.4*  HCT 24.4* 22.6* 21.7* 22.1*  23.2*  MCV 73.7* 74.6* 73.8* 73.4* 73.4*  PLT 178 175 241 327 446*   Cardiac Enzymes: No results for input(s): CKTOTAL, CKMB, CKMBINDEX, TROPONINI in the last 168 hours. BNP: BNP (last 3 results) No results for input(s): BNP in the last 8760 hours.  ProBNP (last 3 results) No results for input(s): PROBNP in the last 8760 hours.  CBG: No results for input(s): GLUCAP in the last 168 hours.  Principal Problem:   Abdominal pain Active Problems:   Pancreatic mass   Nausea and vomiting   Anemia   Leukocytosis   Malignant neoplasm of head of pancreas (HCC)   Dehydration   Hypokalemia   Failure to thrive in adult   Severe protein-calorie malnutrition (HCC)   Intractable nausea and vomiting   Ileus ([place])   Palliative care by specialist   Cancer related pain   Time coordinating discharge: 38 minutes  Signed:        Mychael Smock, DO [facility]  04/07/2020, 4:33 PM

## 2020-04-07 NOTE — Progress Notes (Signed)
Pt discharged to home. DC instructions given with wife at bedside. Pt voiced concerns of prescription drugs (Dilaudid and morphine) not being at the [facility] pharmacy off [address]. This Probation officer spoke with staff at pharmacy and it was confirmed that drugs were not available at pharmacy. Dr. Benny Lennert made aware. Hard copy of prescriptions printed by MD. Prescriptions given to pt's wife at bedside. Both patient and wife expressed thanks to this Probation officer for the effort  Made. Pt left in wheelchair pushed by Nurse Techs x 2 accompanied by wife. Left in stable condition. VWilliams,RN.

## 2020-04-08 ENCOUNTER — Inpatient Hospital Stay: Payer: BC Managed Care – PPO

## 2020-04-08 ENCOUNTER — Inpatient Hospital Stay (HOSPITAL_BASED_OUTPATIENT_CLINIC_OR_DEPARTMENT_OTHER): Payer: BC Managed Care – PPO | Admitting: Oncology

## 2020-04-08 ENCOUNTER — Other Ambulatory Visit: Payer: Self-pay

## 2020-04-08 ENCOUNTER — Other Ambulatory Visit: Payer: Self-pay | Admitting: *Deleted

## 2020-04-08 ENCOUNTER — Telehealth: Payer: Self-pay | Admitting: *Deleted

## 2020-04-08 VITALS — BP 117/75 | HR 131 | Temp 100.0°F | Resp 18 | Ht 73.0 in

## 2020-04-08 DIAGNOSIS — G893 Neoplasm related pain (acute) (chronic): Secondary | ICD-10-CM | POA: Diagnosis not present

## 2020-04-08 DIAGNOSIS — C250 Malignant neoplasm of head of pancreas: Secondary | ICD-10-CM

## 2020-04-08 DIAGNOSIS — Z5111 Encounter for antineoplastic chemotherapy: Secondary | ICD-10-CM | POA: Diagnosis present

## 2020-04-08 DIAGNOSIS — Z808 Family history of malignant neoplasm of other organs or systems: Secondary | ICD-10-CM | POA: Diagnosis not present

## 2020-04-08 DIAGNOSIS — Z95828 Presence of other vascular implants and grafts: Secondary | ICD-10-CM

## 2020-04-08 DIAGNOSIS — K5900 Constipation, unspecified: Secondary | ICD-10-CM | POA: Diagnosis not present

## 2020-04-08 LAB — CBC WITH DIFFERENTIAL (CANCER CENTER ONLY)
Abs Immature Granulocytes: 0.49 10*3/uL — ABNORMAL HIGH (ref 0.00–0.07)
Basophils Absolute: 0 10*3/uL (ref 0.0–0.1)
Basophils Relative: 0 %
Eosinophils Absolute: 0 10*3/uL (ref 0.0–0.5)
Eosinophils Relative: 0 %
HCT: 22.6 % — ABNORMAL LOW (ref 39.0–52.0)
Hemoglobin: 8.1 g/dL — ABNORMAL LOW (ref 13.0–17.0)
Immature Granulocytes: 2 %
Lymphocytes Relative: 4 %
Lymphs Abs: 0.9 10*3/uL (ref 0.7–4.0)
MCH: 25.7 pg — ABNORMAL LOW (ref 26.0–34.0)
MCHC: 35.8 g/dL (ref 30.0–36.0)
MCV: 71.7 fL — ABNORMAL LOW (ref 80.0–100.0)
Monocytes Absolute: 3 10*3/uL — ABNORMAL HIGH (ref 0.1–1.0)
Monocytes Relative: 15 %
Neutro Abs: 16.3 10*3/uL — ABNORMAL HIGH (ref 1.7–7.7)
Neutrophils Relative %: 79 %
Platelet Count: 628 10*3/uL — ABNORMAL HIGH (ref 150–400)
RBC: 3.15 MIL/uL — ABNORMAL LOW (ref 4.22–5.81)
RDW: 14.1 % (ref 11.5–15.5)
WBC Count: 20.6 10*3/uL — ABNORMAL HIGH (ref 4.0–10.5)
nRBC: 0 % (ref 0.0–0.2)

## 2020-04-08 LAB — CMP (CANCER CENTER ONLY)
ALT: 21 U/L (ref 0–44)
AST: 33 U/L (ref 15–41)
Albumin: 2.2 g/dL — ABNORMAL LOW (ref 3.5–5.0)
Alkaline Phosphatase: 355 U/L — ABNORMAL HIGH (ref 38–126)
Anion gap: 6 (ref 5–15)
BUN: 8 mg/dL (ref 6–20)
CO2: 28 mmol/L (ref 22–32)
Calcium: 10.4 mg/dL — ABNORMAL HIGH (ref 8.9–10.3)
Chloride: 99 mmol/L (ref 98–111)
Creatinine: 0.57 mg/dL — ABNORMAL LOW (ref 0.61–1.24)
GFR, Est AFR Am: >60 mL/min (ref >60)
GFR, Estimated: >60 mL/min (ref >60)
Glucose, Bld: 122 mg/dL — ABNORMAL HIGH (ref 70–99)
Potassium: 4.1 mmol/L (ref 3.5–5.1)
Sodium: 133 mmol/L — ABNORMAL LOW (ref 135–145)
Total Bilirubin: 0.5 mg/dL (ref 0.3–1.2)
Total Protein: 5.4 g/dL — ABNORMAL LOW (ref 6.5–8.1)

## 2020-04-08 MED ORDER — ZOLEDRONIC ACID 4 MG/100ML IV SOLN
INTRAVENOUS | Status: AC
  Filled 2020-04-08: qty 100

## 2020-04-08 MED ORDER — SODIUM CHLORIDE 0.9% FLUSH
10.0000 mL | INTRAVENOUS | Status: DC | PRN
  Administered 2020-04-08: 10 mL
  Filled 2020-04-08: qty 10

## 2020-04-08 MED ORDER — PANCRELIPASE (LIP-PROT-AMYL) 36000-114000 UNITS PO CPEP: 36000.0000 [IU] | ORAL_CAPSULE | Freq: Three times a day (TID) | ORAL | 1 refills | Status: DC

## 2020-04-08 MED ORDER — HEPARIN SOD (PORK) LOCK FLUSH 100 UNIT/ML IV SOLN
500.0000 [IU] | Freq: Once | INTRAVENOUS | Status: AC | PRN
  Administered 2020-04-08: 500 [IU]
  Filled 2020-04-08: qty 5

## 2020-04-08 MED ORDER — ZOLEDRONIC ACID 4 MG/100ML IV SOLN
4.0000 mg | Freq: Once | INTRAVENOUS | Status: AC
  Administered 2020-04-08: 4 mg via INTRAVENOUS

## 2020-04-08 MED ORDER — PROCHLORPERAZINE MALEATE 10 MG PO TABS
10.0000 mg | ORAL_TABLET | Freq: Once | ORAL | Status: AC
  Administered 2020-04-08: 10 mg via ORAL

## 2020-04-08 MED ORDER — PROCHLORPERAZINE MALEATE 10 MG PO TABS
ORAL_TABLET | ORAL | Status: AC
  Filled 2020-04-08: qty 1

## 2020-04-08 MED ORDER — HYDROMORPHONE HCL 1 MG/ML PO LIQD: 2.0000 mL | ORAL | 0 refills | Status: DC | PRN

## 2020-04-08 MED ORDER — SODIUM CHLORIDE 0.9 % IV SOLN
Freq: Once | INTRAVENOUS | Status: AC
  Filled 2020-04-08: qty 250

## 2020-04-08 MED ORDER — ADULT MULTIVITAMIN W/MINERALS CH: 1.0000 | ORAL_TABLET | Freq: Every day | ORAL | 1 refills | Status: DC

## 2020-04-08 MED ORDER — MORPHINE SULFATE (PF) 4 MG/ML IV SOLN
INTRAVENOUS | Status: AC
  Filled 2020-04-08: qty 1

## 2020-04-08 MED ORDER — PREDNISONE 20 MG PO TABS: 20.0000 mg | ORAL_TABLET | Freq: Every day | ORAL | 1 refills | Status: DC

## 2020-04-08 MED ORDER — MORPHINE SULFATE 4 MG/ML IJ SOLN
4.0000 mg | Freq: Once | INTRAMUSCULAR | Status: AC
  Administered 2020-04-08: 4 mg via INTRAVENOUS

## 2020-04-08 MED ORDER — MORPHINE SULFATE 4 MG/ML IJ SOLN
4.0000 mg | Freq: Once | INTRAMUSCULAR | Status: DC
  Filled 2020-04-08: qty 1

## 2020-04-08 MED ORDER — MULTIPLE VITAMIN PO TABS: 1.0000 | ORAL_TABLET | Freq: Every day | ORAL | 1 refills | Status: DC

## 2020-04-08 MED ORDER — SODIUM CHLORIDE 0.9 % IV SOLN
1000.0000 mg/m2 | Freq: Once | INTRAVENOUS | Status: AC
  Administered 2020-04-08: 1824 mg via INTRAVENOUS
  Filled 2020-04-08: qty 47.97

## 2020-04-08 MED ORDER — SODIUM CHLORIDE 0.9% FLUSH
10.0000 mL | Freq: Once | INTRAVENOUS | Status: AC
  Administered 2020-04-08: 10 mL
  Filled 2020-04-08: qty 10

## 2020-04-08 MED ORDER — SIMETHICONE 80 MG PO CHEW: 160.0000 mg | CHEWABLE_TABLET | Freq: Four times a day (QID) | ORAL | 1 refills | Status: DC | PRN

## 2020-04-08 MED ORDER — SENNOSIDES-DOCUSATE SODIUM 8.6-50 MG PO TABS: 1.0000 | ORAL_TABLET | Freq: Every day | ORAL | 1 refills | Status: AC

## 2020-04-08 MED ORDER — BISACODYL 10 MG RE SUPP: 10.0000 mg | RECTAL | 1 refills | Status: AC | PRN

## 2020-04-08 MED ORDER — POLYETHYLENE GLYCOL 3350 17 G PO PACK: 17.0000 g | PACK | Freq: Every day | ORAL | 1 refills | Status: AC

## 2020-04-08 MED ORDER — PACLITAXEL PROTEIN-BOUND CHEMO INJECTION 100 MG
100.0000 mg/m2 | Freq: Once | INTRAVENOUS | Status: AC
  Administered 2020-04-08: 175 mg via INTRAVENOUS
  Filled 2020-04-08: qty 35

## 2020-04-08 MED ORDER — FLUCONAZOLE 100 MG PO TABS
100.0000 mg | ORAL_TABLET | Freq: Every day | ORAL | 0 refills | Status: DC
Start: 2020-04-08 — End: 2020-04-22

## 2020-04-08 MED ORDER — MORPHINE SULFATE ER 30 MG PO TBCR: 30.0000 mg | EXTENDED_RELEASE_TABLET | Freq: Three times a day (TID) | ORAL | 0 refills | Status: DC

## 2020-04-08 MED ORDER — HYDROMORPHONE HCL 2 MG PO TABS: 2.0000 mg | ORAL_TABLET | ORAL | 0 refills | Status: DC | PRN

## 2020-04-08 MED FILL — MI-ACID GAS 80 MG TAB CHEW: 80 | 25 days supply | Qty: 200 | Fill #0

## 2020-04-08 MED FILL — POLYETHYLENE GLYCOL 3350 PO: 17 | 30 days supply | Qty: 476 | Fill #0

## 2020-04-08 MED FILL — MORPHINE SULF ER 30 MG TAB: 30 | 30 days supply | Qty: 90 | Fill #0

## 2020-04-08 MED FILL — STOOL SOFTENER-LAXATIVE TAB: 50-8.6 | 30 days supply | Qty: 30 | Fill #0

## 2020-04-08 MED FILL — predniSONE 20 MG TABS: 20 | 30 days supply | Qty: 30 | Fill #0

## 2020-04-08 MED FILL — CERTAVITE/ANTIOXIDANTS TABS: 30 days supply | Qty: 30 | Fill #0

## 2020-04-08 MED FILL — CREON 36000 UNIT CPEP: 36000-11400 | 30 days supply | Qty: 90 | Fill #0

## 2020-04-08 MED FILL — HYDROMORPHONE 5 MG/5 ML SOL: 1 | 24 days supply | Qty: 473 | Fill #0

## 2020-04-08 MED FILL — FLUCONAZOLE 100 MG TAB: 100 | 3 days supply | Qty: 3 | Fill #0

## 2020-04-08 MED FILL — HYDROmorphone HCL 2 MG TABS: 2 | 5 days supply | Qty: 30 | Fill #0

## 2020-04-08 NOTE — Patient Instructions (Signed)
[facility] Discharge Instructions for Patients Receiving Chemotherapy  Today you received the following chemotherapy agents: paclitaxel-protein bound/gemcitabine (abraxane/gemzar)  To help prevent nausea and vomiting after your treatment, we encourage you to take your nausea medication as directed.   If you develop nausea and vomiting that is not controlled by your nausea medication, call the clinic.   BELOW ARE SYMPTOMS THAT SHOULD BE REPORTED IMMEDIATELY:  *FEVER GREATER THAN 100.5 F  *CHILLS WITH OR WITHOUT FEVER  NAUSEA AND VOMITING THAT IS NOT CONTROLLED WITH YOUR NAUSEA MEDICATION  *UNUSUAL SHORTNESS OF BREATH  *UNUSUAL BRUISING OR BLEEDING  TENDERNESS IN MOUTH AND THROAT WITH OR WITHOUT PRESENCE OF ULCERS  *URINARY PROBLEMS  *BOWEL PROBLEMS  UNUSUAL RASH Items with * indicate a potential emergency and should be followed up as soon as possible.  Feel free to call the clinic should you have any questions or concerns. The clinic phone number is [phone].  Please show the CHEMO ALERT CARD at check-in to the Emergency Department and triage nurse.   

## 2020-04-08 NOTE — Patient Instructions (Signed)

## 2020-04-08 NOTE — Progress Notes (Signed)
Per Dr. [name]: OK to treat w/temp 100.0 and tachycardia (is in a lot of pain). WBC is always high.

## 2020-04-08 NOTE — Progress Notes (Signed)
Reports they did not pick up any of the scripts sent to [facility] yesterday after discharge. He has been out of pain medication. Reports [facility] did not [facility] in [facility]. Called [facility] and confirmed the MS Contin and hydromorphone will take 2 days to be delivered. Patient and family agree to move all scripts to [place]. Dr. [name] made aware.

## 2020-04-08 NOTE — Telephone Encounter (Signed)
Referral sent on hospital d/c for PT/OT/home health aid. Asking if Dr. [name] will be attending and sign off orders for home health. OK per Dr. [name].

## 2020-04-08 NOTE — Progress Notes (Signed)
[facility] OFFICE PROGRESS NOTE   Diagnosis: Pancreas cancer  INTERVAL HISTORY:   Joe [name] was discharged from the hospital yesterday.  He has not filled his pain medication and has not received any medication since discharge from the hospital.  He has abdominal pain this morning.  He reports chronic mild dyspnea.  He relates this to pain.  No fever at home.  Objective:  Vital signs in last 24 hours:  Blood pressure 117/75, pulse (!) 131, temperature (!) 101.3 F (38.5 C), temperature source Oral, resp. rate 18, height 6\' 1"  (1.854 m), SpO2 97 %.    HEENT: No thrush Resp: Lungs clear bilaterally Cardio: Regular rate and rhythm, tachycardia GI: Mildly distended, nontender, no mass Vascular: Pitting edema at the lower leg and feet    Portacath/PICC-without erythema  Lab Results:  Lab Results  Component Value Date   WBC 20.6 (H) 04/08/2020   HGB 8.1 (L) 04/08/2020   HCT 22.6 (L) 04/08/2020   MCV 71.7 (L) 04/08/2020   PLT 628 (H) 04/08/2020   NEUTROABS 16.3 (H) 04/08/2020    CMP  Lab Results  Component Value Date   NA 133 (L) 04/08/2020   K 4.1 04/08/2020   CL 99 04/08/2020   CO2 28 04/08/2020   GLUCOSE 122 (H) 04/08/2020   BUN 8 04/08/2020   CREATININE 0.57 (L) 04/08/2020   CALCIUM 10.4 (H) 04/08/2020   PROT 5.4 (L) 04/08/2020   ALBUMIN 2.2 (L) 04/08/2020   AST 33 04/08/2020   ALT 21 04/08/2020   ALKPHOS 355 (H) 04/08/2020   BILITOT 0.5 04/08/2020   GFRNONAA >60 04/08/2020   GFRAA >60 04/08/2020     Medications: I have reviewed the patient's current medications.   Assessment/Plan: 1. Pancreas cancer-stage IV ? CT abdomen/pelvis 01/22/2020-multiple hypoattenuating/hypoenhancing liver lesions-indeterminate ? CT abdomen/pelvis 02/27/2020-ill-defined pancreas head mass, new and enlarging extensive hepatic metastases, retroperitoneal adenopathy ? MRI abdomen 02/28/2020-mass at the junction and head of the pancreas body, no vascular  invasion, multiple hepatic metastases, retroperitoneal adenopathy ? Ultrasound-guided biopsy of a right liver lesion 03/01/2020-poorly differentiated carcinoma with necrosis, cytokeratin 7, cytokeratin 20, and GATA-3 positive ? Cycle 1 gemcitabine/Abraxane 03/25/2020 ? CT abdomen/pelvis 03/28/2020-increased pancreas mass, increased size and number of liver metastases, progressive ascites, stable sclerotic bone lesions at the sacrum, right acetabulum, and proximal right femur, increased periaortic lymphadenopathy ? Cycle 2 gemcitabine/Abraxane 04/08/2020 2. Pain secondary to #1 3. Family history of cancer-father with adrenal cancer 4. Constipation-likely secondary to narcotic analgesics 5. Hypercalcemia 03/15/2020. Zometa 03/16/2020, 04/08/2020 6. Admission 03/29/2020 with abdominal pain, nausea, and dehydration 7. Leukocytosis-likely a leukemoid reaction related to cancer 8. Fever-tumor fever?     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  He has an advanced tumor burden.  He was recently hospitalized with pain, nausea, and dehydration.  His symptoms improved while in the hospital.  His pain was under good control on MS Contin with Dilaudid for breakthrough pain.  We will administer morphine while he is at the cancer center today.  He will resume MS Contin and Dilaudid at home.  He had persistent tachycardia while in the hospital.  This is likely related to the metastatic tumor burden, intravascular depletion, and pain.  No apparent infection was identified in the hospital.  I have a low clinical suspicion for a systemic infection.  He does not have symptoms to suggest a pulmonary embolism.  He has a low-grade fever today, likely tumor fever.  He has recurrent hypercalcemia.  He will receive  intravenous fluids and Zometa today.  We will see Joe [name] in follow-up next week.  He will complete cycle 2 gemcitabine/Abraxane today.  We will t repeat his vital signs in the chemotherapy  room today.  Betsy Coder, MD  04/08/2020  9:27 AM

## 2020-04-08 NOTE — Progress Notes (Signed)
[facility] outpatient pharmacy has MS Contin in stock and hydromorphone pills. Can order liquid and be there by 1pm on Monday. Patient notified. All discharge scripts being sent to [facility] per patient request.

## 2020-04-11 ENCOUNTER — Telehealth: Payer: Self-pay | Admitting: Oncology

## 2020-04-11 NOTE — Telephone Encounter (Signed)
Scheduled appts per 7/30 los. Pt's spouse confirmed appt date and time.

## 2020-04-12 ENCOUNTER — Telehealth: Payer: Self-pay

## 2020-04-12 NOTE — Telephone Encounter (Signed)
Left message for patient's wife Lelan Pons and on patient's cell phone asking if he could come in today at 1:00 for lab and port flush and see Dr. [name] at 2:00.  I have left my direct phone number for call back.

## 2020-04-12 NOTE — Telephone Encounter (Signed)
VM message from [facility] Physical therapist  from [place] stating  911 was called for Pt. She stated that the Pt's  oxygen level lowered to 80% and heart rate elevated to 130-190  on exertion, Pt was not taken to the hospital because oxygen saturation went back up to 92% and heart rate went back down to the 100's  but Charisse would like Pt to have order for wheelchair and walker she states it can be sent to [facility] home health Fax # [phone] She also would like parameters for Heart Rate and oxygen Saturation.Per Leander Rams NP Heart Rate >110 and oxygen rate < 92% This information was left via voicemail to Physical therapist.

## 2020-04-13 ENCOUNTER — Telehealth: Payer: Self-pay | Admitting: Nurse Practitioner

## 2020-04-13 ENCOUNTER — Inpatient Hospital Stay: Payer: BC Managed Care – PPO

## 2020-04-13 ENCOUNTER — Telehealth: Payer: Self-pay | Admitting: *Deleted

## 2020-04-13 ENCOUNTER — Ambulatory Visit: Payer: BC Managed Care – PPO | Admitting: Nurse Practitioner

## 2020-04-13 ENCOUNTER — Encounter: Payer: Self-pay | Admitting: Nurse Practitioner

## 2020-04-13 ENCOUNTER — Other Ambulatory Visit: Payer: BC Managed Care – PPO

## 2020-04-13 ENCOUNTER — Other Ambulatory Visit: Payer: Self-pay

## 2020-04-13 ENCOUNTER — Inpatient Hospital Stay (HOSPITAL_BASED_OUTPATIENT_CLINIC_OR_DEPARTMENT_OTHER): Payer: BC Managed Care – PPO | Admitting: Nurse Practitioner

## 2020-04-13 ENCOUNTER — Inpatient Hospital Stay: Payer: BC Managed Care – PPO | Attending: Oncology

## 2020-04-13 VITALS — BP 119/85 | HR 97 | Temp 98.1°F | Resp 17 | Ht 73.0 in | Wt 138.0 lb

## 2020-04-13 DIAGNOSIS — R0600 Dyspnea, unspecified: Secondary | ICD-10-CM | POA: Insufficient documentation

## 2020-04-13 DIAGNOSIS — Z95828 Presence of other vascular implants and grafts: Secondary | ICD-10-CM

## 2020-04-13 DIAGNOSIS — C250 Malignant neoplasm of head of pancreas: Secondary | ICD-10-CM

## 2020-04-13 DIAGNOSIS — R000 Tachycardia, unspecified: Secondary | ICD-10-CM | POA: Diagnosis not present

## 2020-04-13 DIAGNOSIS — Z808 Family history of malignant neoplasm of other organs or systems: Secondary | ICD-10-CM | POA: Diagnosis not present

## 2020-04-13 DIAGNOSIS — K5900 Constipation, unspecified: Secondary | ICD-10-CM | POA: Diagnosis not present

## 2020-04-13 DIAGNOSIS — G893 Neoplasm related pain (acute) (chronic): Secondary | ICD-10-CM | POA: Diagnosis not present

## 2020-04-13 LAB — CMP (CANCER CENTER ONLY)
ALT: 19 U/L (ref 0–44)
AST: 31 U/L (ref 15–41)
Albumin: 2 g/dL — ABNORMAL LOW (ref 3.5–5.0)
Alkaline Phosphatase: 268 U/L — ABNORMAL HIGH (ref 38–126)
Anion gap: 8 (ref 5–15)
BUN: 10 mg/dL (ref 6–20)
CO2: 28 mmol/L (ref 22–32)
Calcium: 9.2 mg/dL (ref 8.9–10.3)
Chloride: 101 mmol/L (ref 98–111)
Creatinine: 0.49 mg/dL — ABNORMAL LOW (ref 0.61–1.24)
GFR, Est AFR Am: >60 mL/min (ref >60)
GFR, Estimated: >60 mL/min (ref >60)
Glucose, Bld: 118 mg/dL — ABNORMAL HIGH (ref 70–99)
Potassium: 3.9 mmol/L (ref 3.5–5.1)
Sodium: 137 mmol/L (ref 135–145)
Total Bilirubin: 0.8 mg/dL (ref 0.3–1.2)
Total Protein: 5.4 g/dL — ABNORMAL LOW (ref 6.5–8.1)

## 2020-04-13 MED ORDER — HEPARIN SOD (PORK) LOCK FLUSH 100 UNIT/ML IV SOLN
500.0000 [IU] | Freq: Once | INTRAVENOUS | Status: AC | PRN
  Administered 2020-04-13: 500 [IU]
  Filled 2020-04-13: qty 5

## 2020-04-13 MED ORDER — SODIUM CHLORIDE 0.9% FLUSH
10.0000 mL | Freq: Once | INTRAVENOUS | Status: AC | PRN
  Administered 2020-04-13: 10 mL
  Filled 2020-04-13: qty 10

## 2020-04-13 NOTE — Progress Notes (Addendum)
  [facility] [facility] OFFICE PROGRESS NOTE   Diagnosis: Pancreas cancer  INTERVAL HISTORY:   Joe [name] returns for follow-up.  He completed cycle 2 gemcitabine/Abraxane 04/08/2020.  He denies nausea/vomiting.  No diarrhea.  He complains of "phlegm".  No fever or cough.  He reports shortness of breath related to the phlegm.  He has difficulty swallowing his medications due to the phlegm.  No chest pain, specifically no pleuritic chest pain.  Appetite is poor.  Leg swelling is better.  His wife reports oxygen level dropped while participating in physical therapy yesterday.  Objective:  Vital signs in last 24 hours:  Blood pressure 119/85, pulse 97, temperature 98.1 F (36.7 C), temperature source Temporal, resp. rate 17, height 6\' 1"  (1.854 m), weight 138 lb (62.6 kg), SpO2 97 %.    HEENT: No thrush or ulcers. Resp: Lungs clear bilaterally, mildly diminished right lower lung field.  No respiratory distress. Cardio: Regular, tachycardic. GI: Abdomen is distended.  He appears to have ascites. Vascular: Pitting edema at the lower legs and feet. Port-A-Cath without erythema.  Lab Results:  Lab Results  Component Value Date   WBC 20.6 (H) 04/08/2020   HGB 8.1 (L) 04/08/2020   HCT 22.6 (L) 04/08/2020   MCV 71.7 (L) 04/08/2020   PLT 628 (H) 04/08/2020   NEUTROABS 16.3 (H) 04/08/2020    Imaging:  No results found.  Medications: I have reviewed the patient's current medications.  Assessment/Plan: 1. Pancreas cancer-stage IV ? CT abdomen/pelvis 01/22/2020-multiple hypoattenuating/hypoenhancing liver lesions-indeterminate ? CT abdomen/pelvis 02/27/2020-ill-defined pancreas head mass, new and enlarging extensive hepatic metastases, retroperitoneal adenopathy ? MRI abdomen 02/28/2020-mass at the junction and head of the pancreas body, no vascular invasion, multiple hepatic metastases, retroperitoneal adenopathy ? Ultrasound-guided biopsy of a right liver lesion 03/01/2020-poorly  differentiated carcinoma with necrosis, cytokeratin 7, cytokeratin 20, and GATA-3 positive ? Cycle 1 gemcitabine/Abraxane 03/25/2020 ? CT abdomen/pelvis 03/28/2020-increased pancreas mass, increased size and number of liver metastases, progressive ascites, stable sclerotic bone lesions at the sacrum, right acetabulum, and proximal right femur, increased periaortic lymphadenopathy ? Cycle 2 gemcitabine/Abraxane 04/08/2020 2. Pain secondary to #1 3. Family history of cancer-father with adrenal cancer 4. Constipation-likely secondary to narcotic analgesics 5. Hypercalcemia 03/15/2020. Zometa 03/16/2020, 04/08/2020 6. Admission 03/29/2020 with abdominal pain, nausea, and dehydration 7. Leukocytosis-likely a leukemoid reaction related to cancer 8. Fever-tumor fever?  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  He has completed 2 cycles of gemcitabine/Abraxane. Performance status continues to be poor.  He has persistent tachycardia likely multifactorial secondary to metastatic tumor burden, intravascular depletion and pain.  Low clinical suspicion for pulmonary embolus.  We reviewed the chemistry panel from today.  Calcium level is better.  He will try over-the-counter Robitussin to see if this helps with the phlegm.  He is scheduled to return for follow-up and the third cycle of gemcitabine/Abraxane on 05/10/2020.   Patient seen with Dr. [name].  Ned Card ANP/GNP-BC   04/13/2020  10:29 AM  This was a shared visit with Ned Card.  Joe [name] was interviewed and examined.  He appears unchanged.  He continues to have a poor performance status due to the metastatic tumor burden.  Julieanne Manson, MD

## 2020-04-13 NOTE — Telephone Encounter (Addendum)
Physical therapy wanted MD aware that when he was seen for 1st session yesterday, they almost called 911 due to low sat. He was aroused and sat returned to 92%. Pulse rate went to 131 during session. After resting his pulse is 90-108. Asking for parameters for his session for O2 sat and pulse as to when to stop session. Also need verbal order for OK to see him 1 week 1, then 2 week 4. They think he needs a lightweight folding wheelchair with elevated leg rests due to inability to walk very far in home without sitting. Also requesting a standard walker with two wheels for him. Can fax orders to [phone]

## 2020-04-13 NOTE — Telephone Encounter (Signed)
Per 8/4 los, no changes made to pt schedule

## 2020-04-14 ENCOUNTER — Telehealth: Payer: Self-pay | Admitting: *Deleted

## 2020-04-14 NOTE — Telephone Encounter (Signed)
Left message that per Ned Card, NP: Hold PT session for pulse > 110 and/or oxygen sat < 90%. Also informed her that scripts for W/C and walker were faxed to [phone].

## 2020-04-15 ENCOUNTER — Other Ambulatory Visit: Payer: Self-pay | Admitting: Oncology

## 2020-04-20 ENCOUNTER — Other Ambulatory Visit: Payer: Self-pay | Admitting: Oncology

## 2020-04-20 ENCOUNTER — Telehealth: Payer: Self-pay

## 2020-04-20 ENCOUNTER — Other Ambulatory Visit: Payer: Self-pay | Admitting: *Deleted

## 2020-04-20 DIAGNOSIS — C250 Malignant neoplasm of head of pancreas: Secondary | ICD-10-CM

## 2020-04-20 NOTE — Telephone Encounter (Signed)
[facility] Physical therapist called to notify Dr [name] about patient's HR. PT states that during activity patient's HR is in the 130s. While resting it is 110-115. With activity his O2 is 88%. While resting it is 90%-91%. Her call back number is [phone]. Made Dr [name] aware.

## 2020-04-20 NOTE — Progress Notes (Signed)
Due to persistent tachycardia and drop is sats w/activity, MD has ordered CT chest ango to R/O PE. Notified managed care to work on [place]. Patient made aware of MD concern/order. He agrees to proceed w/scan when it gets scheduled.

## 2020-04-21 ENCOUNTER — Ambulatory Visit (HOSPITAL_COMMUNITY)
Admission: RE | Admit: 2020-04-21 | Discharge: 2020-04-21 | Disposition: A | Discharge status: Home / Self Care | Payer: BC Managed Care – PPO | Source: Ambulatory Visit | Attending: Oncology | Admitting: Oncology

## 2020-04-21 ENCOUNTER — Other Ambulatory Visit: Payer: Self-pay

## 2020-04-21 ENCOUNTER — Telehealth: Payer: Self-pay | Admitting: *Deleted

## 2020-04-21 DIAGNOSIS — C250 Malignant neoplasm of head of pancreas: Secondary | ICD-10-CM | POA: Insufficient documentation

## 2020-04-21 DIAGNOSIS — R0902 Hypoxemia: Secondary | ICD-10-CM | POA: Diagnosis not present

## 2020-04-21 MED ORDER — IOHEXOL 350 MG/ML SOLN
100.0000 mL | Freq: Once | INTRAVENOUS | Status: AC | PRN
  Administered 2020-04-21: 50 mL via INTRAVENOUS

## 2020-04-21 MED ORDER — SODIUM CHLORIDE (PF) 0.9 % IJ SOLN
INTRAMUSCULAR | Status: AC
  Filled 2020-04-21: qty 50

## 2020-04-21 MED ORDER — HEPARIN SOD (PORK) LOCK FLUSH 100 UNIT/ML IV SOLN
500.0000 [IU] | Freq: Once | INTRAVENOUS | Status: AC
  Administered 2020-04-21: 500 [IU] via INTRAVENOUS

## 2020-04-21 MED ORDER — HEPARIN SOD (PORK) LOCK FLUSH 100 UNIT/ML IV SOLN
INTRAVENOUS | Status: AC
  Administered 2020-04-21: 500 [IU]
  Filled 2020-04-21: qty 5

## 2020-04-21 NOTE — Telephone Encounter (Signed)
CT chest/angio scheduled for today at 1145/1200. Wife notified and instructed for [name] to only take water and pain meds till after scan. They will hold him and call report in case an intervention is required today.

## 2020-04-22 ENCOUNTER — Inpatient Hospital Stay: Payer: BC Managed Care – PPO

## 2020-04-22 ENCOUNTER — Inpatient Hospital Stay (HOSPITAL_BASED_OUTPATIENT_CLINIC_OR_DEPARTMENT_OTHER): Payer: BC Managed Care – PPO

## 2020-04-22 ENCOUNTER — Other Ambulatory Visit: Payer: Self-pay

## 2020-04-22 ENCOUNTER — Other Ambulatory Visit (HOSPITAL_COMMUNITY): Payer: Self-pay | Admitting: Radiology

## 2020-04-22 ENCOUNTER — Inpatient Hospital Stay (HOSPITAL_BASED_OUTPATIENT_CLINIC_OR_DEPARTMENT_OTHER): Payer: BC Managed Care – PPO | Admitting: Oncology

## 2020-04-22 ENCOUNTER — Telehealth: Payer: Self-pay

## 2020-04-22 ENCOUNTER — Inpatient Hospital Stay (HOSPITAL_BASED_OUTPATIENT_CLINIC_OR_DEPARTMENT_OTHER): Payer: BC Managed Care – PPO | Admitting: Medical

## 2020-04-22 ENCOUNTER — Ambulatory Visit (HOSPITAL_COMMUNITY)
Admission: RE | Admit: 2020-04-22 | Discharge: 2020-04-22 | Disposition: A | Discharge status: Home / Self Care | Payer: BC Managed Care – PPO | Source: Ambulatory Visit | Attending: Oncology | Admitting: Oncology

## 2020-04-22 ENCOUNTER — Other Ambulatory Visit: Payer: Self-pay | Admitting: Medical

## 2020-04-22 ENCOUNTER — Inpatient Hospital Stay (HOSPITAL_COMMUNITY)
Admission: EM | Admit: 2020-04-22 | Discharge: 2020-05-11 | DRG: 436 | Disposition: E | Payer: BC Managed Care – PPO | Attending: Internal Medicine | Admitting: Internal Medicine

## 2020-04-22 ENCOUNTER — Ambulatory Visit (HOSPITAL_COMMUNITY)
Admission: RE | Admit: 2020-04-22 | Discharge: 2020-04-22 | Disposition: A | Discharge status: Home / Self Care | Payer: BC Managed Care – PPO | Source: Ambulatory Visit | Attending: Radiology | Admitting: Radiology

## 2020-04-22 VITALS — BP 130/89 | HR 120 | Temp 95.6°F | Resp 21 | Ht 73.0 in | Wt 156.5 lb

## 2020-04-22 DIAGNOSIS — Z9889 Other specified postprocedural states: Secondary | ICD-10-CM

## 2020-04-22 DIAGNOSIS — C250 Malignant neoplasm of head of pancreas: Secondary | ICD-10-CM

## 2020-04-22 DIAGNOSIS — Z87891 Personal history of nicotine dependence: Secondary | ICD-10-CM | POA: Diagnosis not present

## 2020-04-22 DIAGNOSIS — E871 Hypo-osmolality and hyponatremia: Secondary | ICD-10-CM | POA: Diagnosis present

## 2020-04-22 DIAGNOSIS — B370 Candidal stomatitis: Secondary | ICD-10-CM | POA: Diagnosis present

## 2020-04-22 DIAGNOSIS — G893 Neoplasm related pain (acute) (chronic): Secondary | ICD-10-CM | POA: Diagnosis present

## 2020-04-22 DIAGNOSIS — D72829 Elevated white blood cell count, unspecified: Secondary | ICD-10-CM | POA: Diagnosis present

## 2020-04-22 DIAGNOSIS — F1290 Cannabis use, unspecified, uncomplicated: Secondary | ICD-10-CM | POA: Diagnosis present

## 2020-04-22 DIAGNOSIS — R0902 Hypoxemia: Secondary | ICD-10-CM | POA: Diagnosis present

## 2020-04-22 DIAGNOSIS — J90 Pleural effusion, not elsewhere classified: Secondary | ICD-10-CM | POA: Insufficient documentation

## 2020-04-22 DIAGNOSIS — K5909 Other constipation: Secondary | ICD-10-CM | POA: Diagnosis present

## 2020-04-22 DIAGNOSIS — Z808 Family history of malignant neoplasm of other organs or systems: Secondary | ICD-10-CM | POA: Diagnosis not present

## 2020-04-22 DIAGNOSIS — E43 Unspecified severe protein-calorie malnutrition: Secondary | ICD-10-CM | POA: Diagnosis not present

## 2020-04-22 DIAGNOSIS — L89152 Pressure ulcer of sacral region, stage 2: Secondary | ICD-10-CM | POA: Diagnosis present

## 2020-04-22 DIAGNOSIS — D649 Anemia, unspecified: Secondary | ICD-10-CM | POA: Diagnosis present

## 2020-04-22 DIAGNOSIS — D509 Iron deficiency anemia, unspecified: Secondary | ICD-10-CM | POA: Diagnosis present

## 2020-04-22 DIAGNOSIS — R590 Localized enlarged lymph nodes: Secondary | ICD-10-CM | POA: Diagnosis present

## 2020-04-22 DIAGNOSIS — Z6820 Body mass index (BMI) 20.0-20.9, adult: Secondary | ICD-10-CM

## 2020-04-22 DIAGNOSIS — Z23 Encounter for immunization: Secondary | ICD-10-CM | POA: Diagnosis not present

## 2020-04-22 DIAGNOSIS — Z66 Do not resuscitate: Secondary | ICD-10-CM | POA: Diagnosis not present

## 2020-04-22 DIAGNOSIS — R54 Age-related physical debility: Secondary | ICD-10-CM | POA: Diagnosis present

## 2020-04-22 DIAGNOSIS — L8990 Pressure ulcer of unspecified site, unspecified stage: Secondary | ICD-10-CM | POA: Insufficient documentation

## 2020-04-22 DIAGNOSIS — R627 Adult failure to thrive: Secondary | ICD-10-CM | POA: Diagnosis present

## 2020-04-22 DIAGNOSIS — C259 Malignant neoplasm of pancreas, unspecified: Secondary | ICD-10-CM

## 2020-04-22 DIAGNOSIS — J910 Malignant pleural effusion: Secondary | ICD-10-CM | POA: Diagnosis present

## 2020-04-22 DIAGNOSIS — C787 Secondary malignant neoplasm of liver and intrahepatic bile duct: Secondary | ICD-10-CM | POA: Diagnosis present

## 2020-04-22 DIAGNOSIS — Z9221 Personal history of antineoplastic chemotherapy: Secondary | ICD-10-CM | POA: Diagnosis not present

## 2020-04-22 DIAGNOSIS — Z7189 Other specified counseling: Secondary | ICD-10-CM | POA: Diagnosis not present

## 2020-04-22 DIAGNOSIS — N179 Acute kidney failure, unspecified: Secondary | ICD-10-CM | POA: Diagnosis present

## 2020-04-22 DIAGNOSIS — Z20822 Contact with and (suspected) exposure to covid-19: Secondary | ICD-10-CM | POA: Diagnosis present

## 2020-04-22 DIAGNOSIS — R64 Cachexia: Secondary | ICD-10-CM

## 2020-04-22 DIAGNOSIS — R000 Tachycardia, unspecified: Secondary | ICD-10-CM

## 2020-04-22 DIAGNOSIS — R0602 Shortness of breath: Secondary | ICD-10-CM

## 2020-04-22 DIAGNOSIS — Z7952 Long term (current) use of systemic steroids: Secondary | ICD-10-CM | POA: Diagnosis not present

## 2020-04-22 DIAGNOSIS — Z95828 Presence of other vascular implants and grafts: Secondary | ICD-10-CM

## 2020-04-22 DIAGNOSIS — Z515 Encounter for palliative care: Secondary | ICD-10-CM | POA: Diagnosis not present

## 2020-04-22 DIAGNOSIS — Z79899 Other long term (current) drug therapy: Secondary | ICD-10-CM

## 2020-04-22 HISTORY — PX: IR THORACENTESIS ASP PLEURAL SPACE W/IMG GUIDE: IMG5380

## 2020-04-22 LAB — CBC WITH DIFFERENTIAL (CANCER CENTER ONLY)
Abs Immature Granulocytes: 0.24 10*3/uL — ABNORMAL HIGH (ref 0.00–0.07)
Basophils Absolute: 0 10*3/uL (ref 0.0–0.1)
Basophils Relative: 0 %
Eosinophils Absolute: 0 10*3/uL (ref 0.0–0.5)
Eosinophils Relative: 0 %
HCT: 24.1 % — ABNORMAL LOW (ref 39.0–52.0)
Hemoglobin: 8.5 g/dL — ABNORMAL LOW (ref 13.0–17.0)
Immature Granulocytes: 1 %
Lymphocytes Relative: 3 %
Lymphs Abs: 0.7 10*3/uL (ref 0.7–4.0)
MCH: 24.6 pg — ABNORMAL LOW (ref 26.0–34.0)
MCHC: 35.3 g/dL (ref 30.0–36.0)
MCV: 69.7 fL — ABNORMAL LOW (ref 80.0–100.0)
Monocytes Absolute: 2.5 10*3/uL — ABNORMAL HIGH (ref 0.1–1.0)
Monocytes Relative: 12 %
Neutro Abs: 17.8 10*3/uL — ABNORMAL HIGH (ref 1.7–7.7)
Neutrophils Relative %: 84 %
Platelet Count: 553 10*3/uL — ABNORMAL HIGH (ref 150–400)
RBC: 3.46 MIL/uL — ABNORMAL LOW (ref 4.22–5.81)
RDW: 15.8 % — ABNORMAL HIGH (ref 11.5–15.5)
WBC Count: 21.2 10*3/uL — ABNORMAL HIGH (ref 4.0–10.5)
nRBC: 0.1 % (ref 0.0–0.2)

## 2020-04-22 LAB — CMP (CANCER CENTER ONLY)
ALT: 13 U/L (ref 0–44)
AST: 36 U/L (ref 15–41)
Albumin: 2 g/dL — ABNORMAL LOW (ref 3.5–5.0)
Alkaline Phosphatase: 280 U/L — ABNORMAL HIGH (ref 38–126)
Anion gap: 9 (ref 5–15)
BUN: 16 mg/dL (ref 6–20)
CO2: 24 mmol/L (ref 22–32)
Calcium: 9.9 mg/dL (ref 8.9–10.3)
Chloride: 100 mmol/L (ref 98–111)
Creatinine: 0.65 mg/dL (ref 0.61–1.24)
GFR, Est AFR Am: >60 mL/min (ref >60)
GFR, Estimated: >60 mL/min (ref >60)
Glucose, Bld: 132 mg/dL — ABNORMAL HIGH (ref 70–99)
Potassium: 4.7 mmol/L (ref 3.5–5.1)
Sodium: 133 mmol/L — ABNORMAL LOW (ref 135–145)
Total Bilirubin: 0.8 mg/dL (ref 0.3–1.2)
Total Protein: 6.1 g/dL — ABNORMAL LOW (ref 6.5–8.1)

## 2020-04-22 LAB — CREATININE, SERUM
Creatinine, Ser: 0.6 mg/dL — ABNORMAL LOW (ref 0.61–1.24)
GFR calc Af Amer: >60 mL/min (ref >60)
GFR calc non Af Amer: >60 mL/min (ref >60)

## 2020-04-22 LAB — CBC
HCT: 25 % — ABNORMAL LOW (ref 39.0–52.0)
Hemoglobin: 8.6 g/dL — ABNORMAL LOW (ref 13.0–17.0)
MCH: 24.6 pg — ABNORMAL LOW (ref 26.0–34.0)
MCHC: 34.4 g/dL (ref 30.0–36.0)
MCV: 71.4 fL — ABNORMAL LOW (ref 80.0–100.0)
Platelets: 507 10*3/uL — ABNORMAL HIGH (ref 150–400)
RBC: 3.5 MIL/uL — ABNORMAL LOW (ref 4.22–5.81)
RDW: 15.9 % — ABNORMAL HIGH (ref 11.5–15.5)
WBC: 14.1 10*3/uL — ABNORMAL HIGH (ref 4.0–10.5)
nRBC: 0.2 % (ref 0.0–0.2)

## 2020-04-22 LAB — SARS CORONAVIRUS 2 BY RT PCR (HOSPITAL ORDER, PERFORMED IN ~~LOC~~ HOSPITAL LAB): SARS Coronavirus 2: NEGATIVE

## 2020-04-22 MED ORDER — SODIUM CHLORIDE 0.9% FLUSH
3.0000 mL | Freq: Two times a day (BID) | INTRAVENOUS | Status: DC
  Administered 2020-04-22: 3 mL via INTRAVENOUS

## 2020-04-22 MED ORDER — HYDROMORPHONE HCL 1 MG/ML IJ SOLN
1.0000 mg | INTRAMUSCULAR | Status: AC | PRN
  Administered 2020-04-23 (×2): 1 mg via INTRAVENOUS
  Filled 2020-04-22 (×2): qty 1

## 2020-04-22 MED ORDER — PANCRELIPASE (LIP-PROT-AMYL) 36000-114000 UNITS PO CPEP
36000.0000 [IU] | ORAL_CAPSULE | Freq: Three times a day (TID) | ORAL | Status: DC
  Administered 2020-04-23: 36000 [IU] via ORAL
  Filled 2020-04-22 (×15): qty 1

## 2020-04-22 MED ORDER — CHLORHEXIDINE GLUCONATE CLOTH 2 % EX PADS
6.0000 | MEDICATED_PAD | Freq: Every day | CUTANEOUS | Status: DC
  Administered 2020-04-23: 6 via TOPICAL

## 2020-04-22 MED ORDER — SODIUM CHLORIDE 0.9 % IV SOLN
1000.0000 mg/m2 | Freq: Once | INTRAVENOUS | Status: AC
  Administered 2020-04-22: 1824 mg via INTRAVENOUS
  Filled 2020-04-22: qty 47.97

## 2020-04-22 MED ORDER — PROCHLORPERAZINE MALEATE 10 MG PO TABS
ORAL_TABLET | ORAL | Status: AC
  Filled 2020-04-22: qty 1

## 2020-04-22 MED ORDER — PROCHLORPERAZINE MALEATE 10 MG PO TABS: 10.0000 mg | ORAL_TABLET | Freq: Once | ORAL | Status: DC

## 2020-04-22 MED ORDER — BISACODYL 10 MG RE SUPP: 10.0000 mg | RECTAL | Status: DC | PRN

## 2020-04-22 MED ORDER — SODIUM CHLORIDE 0.9% FLUSH
10.0000 mL | Freq: Once | INTRAVENOUS | Status: AC | PRN
  Administered 2020-04-22: 10 mL
  Filled 2020-04-22: qty 10

## 2020-04-22 MED ORDER — ADULT MULTIVITAMIN W/MINERALS CH: 1.0000 | ORAL_TABLET | Freq: Every day | ORAL | Status: DC

## 2020-04-22 MED ORDER — SODIUM CHLORIDE 0.9% FLUSH: 3.0000 mL | INTRAVENOUS | Status: DC | PRN

## 2020-04-22 MED ORDER — PACLITAXEL PROTEIN-BOUND CHEMO INJECTION 100 MG
100.0000 mg/m2 | Freq: Once | INTRAVENOUS | Status: AC
  Administered 2020-04-22: 175 mg via INTRAVENOUS
  Filled 2020-04-22: qty 35

## 2020-04-22 MED ORDER — ALBUTEROL SULFATE (2.5 MG/3ML) 0.083% IN NEBU: 2.5000 mg | INHALATION_SOLUTION | RESPIRATORY_TRACT | Status: DC | PRN

## 2020-04-22 MED ORDER — MORPHINE SULFATE ER 30 MG PO TBCR
30.0000 mg | EXTENDED_RELEASE_TABLET | Freq: Three times a day (TID) | ORAL | Status: DC
  Administered 2020-04-23 – 2020-04-24 (×2): 30 mg via ORAL
  Filled 2020-04-22 (×3): qty 1

## 2020-04-22 MED ORDER — SENNOSIDES-DOCUSATE SODIUM 8.6-50 MG PO TABS
1.0000 | ORAL_TABLET | Freq: Every day | ORAL | Status: DC
  Filled 2020-04-22: qty 1

## 2020-04-22 MED ORDER — MORPHINE SULFATE (PF) 4 MG/ML IV SOLN
INTRAVENOUS | Status: AC
  Filled 2020-04-22: qty 1

## 2020-04-22 MED ORDER — SODIUM CHLORIDE 0.9% FLUSH
10.0000 mL | INTRAVENOUS | Status: DC | PRN
  Administered 2020-04-22: 10 mL
  Filled 2020-04-22: qty 10

## 2020-04-22 MED ORDER — PROMETHAZINE HCL 25 MG PO TABS: 25.0000 mg | ORAL_TABLET | Freq: Four times a day (QID) | ORAL | Status: DC | PRN

## 2020-04-22 MED ORDER — POLYETHYLENE GLYCOL 3350 17 G PO PACK: 17.0000 g | PACK | Freq: Every day | ORAL | Status: DC

## 2020-04-22 MED ORDER — SIMETHICONE 80 MG PO CHEW: 160.0000 mg | CHEWABLE_TABLET | Freq: Four times a day (QID) | ORAL | Status: DC | PRN

## 2020-04-22 MED ORDER — ONDANSETRON HCL 4 MG/2ML IJ SOLN: 4.0000 mg | Freq: Three times a day (TID) | INTRAMUSCULAR | Status: DC | PRN

## 2020-04-22 MED ORDER — LIDOCAINE HCL 1 % IJ SOLN
INTRAMUSCULAR | Status: AC
  Filled 2020-04-22: qty 20

## 2020-04-22 MED ORDER — LIDOCAINE HCL 1 % IJ SOLN
INTRAMUSCULAR | Status: DC | PRN
  Administered 2020-04-22: 10 mL

## 2020-04-22 MED ORDER — SODIUM CHLORIDE 0.9 % IV SOLN
Freq: Once | INTRAVENOUS | Status: AC
  Filled 2020-04-22: qty 250

## 2020-04-22 MED ORDER — SODIUM CHLORIDE 0.9 % IV SOLN: 250.0000 mL | INTRAVENOUS | Status: DC | PRN

## 2020-04-22 MED ORDER — ENOXAPARIN SODIUM 40 MG/0.4ML ~~LOC~~ SOLN
40.0000 mg | SUBCUTANEOUS | Status: DC
  Administered 2020-04-22: 40 mg via SUBCUTANEOUS
  Filled 2020-04-22: qty 0.4

## 2020-04-22 MED ORDER — SODIUM CHLORIDE 0.9% FLUSH: 10.0000 mL | INTRAVENOUS | Status: DC | PRN

## 2020-04-22 MED ORDER — HYDROMORPHONE HCL 1 MG/ML PO LIQD: 2.0000 mL | ORAL | Status: DC | PRN

## 2020-04-22 MED ORDER — HYDROMORPHONE HCL 1 MG/ML IJ SOLN
1.0000 mg | Freq: Once | INTRAMUSCULAR | Status: AC
  Administered 2020-04-22: 1 mg via INTRAVENOUS
  Filled 2020-04-22: qty 1

## 2020-04-22 MED ORDER — PREDNISONE 20 MG PO TABS
20.0000 mg | ORAL_TABLET | Freq: Every day | ORAL | Status: DC
  Filled 2020-04-22: qty 1

## 2020-04-22 MED ORDER — MORPHINE SULFATE 4 MG/ML IJ SOLN
4.0000 mg | Freq: Once | INTRAMUSCULAR | Status: AC
  Administered 2020-04-22: 4 mg via INTRAVENOUS
  Filled 2020-04-22: qty 1

## 2020-04-22 MED ORDER — HEPARIN SOD (PORK) LOCK FLUSH 100 UNIT/ML IV SOLN
500.0000 [IU] | Freq: Once | INTRAVENOUS | Status: AC | PRN
  Administered 2020-04-22: 500 [IU]
  Filled 2020-04-22: qty 5

## 2020-04-22 NOTE — Progress Notes (Signed)
Per Dr. [name]: OK to treat w/pulse 110 to 120 today.  Notified patient that his COVID test was negative, so will have the thoracentesis today at 2:45/3:00 at [place] in at radiology. Wrote information down for him and called wife and left her VM with the information as well.
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
## 2020-04-22 NOTE — Progress Notes (Signed)
Joe [name] was seen for collection of a stat COVID test prior to having a thoracentesis completed. A COVID sample was collected as requested.  Sandi Mealy, MHS, PA-C Physician Assistant

## 2020-04-22 NOTE — Patient Instructions (Signed)
Joe [name] Discharge Instructions for Patients Receiving Chemotherapy  Today you received the following chemotherapy agents: paclitaxel-protein bound/gemcitabine (abraxane/gemzar)  To help prevent nausea and vomiting after your treatment, we encourage you to take your nausea medication as directed.   If you develop nausea and vomiting that is not controlled by your nausea medication, call the clinic.   BELOW ARE SYMPTOMS THAT SHOULD BE REPORTED IMMEDIATELY:  *FEVER GREATER THAN 100.5 F  *CHILLS WITH OR WITHOUT FEVER  NAUSEA AND VOMITING THAT IS NOT CONTROLLED WITH YOUR NAUSEA MEDICATION  *UNUSUAL SHORTNESS OF BREATH  *UNUSUAL BRUISING OR BLEEDING  TENDERNESS IN MOUTH AND THROAT WITH OR WITHOUT PRESENCE OF ULCERS  *URINARY PROBLEMS  *BOWEL PROBLEMS  UNUSUAL RASH Items with * indicate a potential emergency and should be followed up as soon as possible.  Feel free to call the clinic should you have any questions or concerns. The clinic phone number is [phone].  Please show the [place] at check-in to the Emergency Department and triage nurse.

## 2020-04-22 NOTE — ED Notes (Signed)
ED Provider at bedside. 

## 2020-04-22 NOTE — Progress Notes (Signed)
   05/07/2020 1958  Assess: MEWS Score  Temp 98.4 F (36.9 C)  BP 111/80  Pulse Rate (!) 112  Resp 20  Level of Consciousness Alert  SpO2 96 %  O2 Device Nasal Cannula  O2 Flow Rate (L/min) 4 L/min  Assess: MEWS Score  MEWS Temp 0  MEWS Systolic 0  MEWS Pulse 2  MEWS RR 0  MEWS LOC 0  MEWS Score 2  MEWS Score Color Yellow  Assess: if the MEWS score is Yellow or Red  Were vital signs taken at a resting state? Yes  Focused Assessment No change from prior assessment  Early Detection of Sepsis Score *See Row Information* Low  MEWS guidelines implemented *See Row Information* No, previously yellow, continue vital signs every 4 hours  Treat  MEWS Interventions Other (Comment) (Warm Blankets;offered meds-none taken by pt.)  Notify: Charge Nurse/RN  Name of Charge Nurse/RN Notified Pam H.  Date Charge Nurse/RN Notified 04/23/2020  Time Charge Nurse/RN Notified 1958

## 2020-04-22 NOTE — Progress Notes (Signed)
This RN and second RN Caryl Pina were initially called out to radiology waiting to assess a patient who was brought over from [facility] via [place]. Patient arrives with son and wife after being send over for thoracentesis. Upon initial presentation with patient, there was notable respiratory distress and patient states" he cannot breath and to take him back and get this fluid off". Patient was immediately wheeled back to radiology nurses station and transferred to bed. Once placed on cardiac monitor and pulse oximetry, sats were 79% on room air and HR was ST 130's. Patient placed on nasal cannula and sats improved to high 90s and HR now in the 110-120s. Patient was seen by NP and IR tech and procedure began. Once procedure was completed a chest xray was obtained and it was recommended the patient be seen in the ED. Patient was very adament and refused to be seen at this time. Patient was taking to radiology waiting room and met by wife, and explained to her our recommendation to be seen and evaluated and wife agreed, however patient still denies to be seen and states" he would rather go to [facility] at this time". This RN made multiple attempts to take him to emergency department and patient continues to refuse. Patient was getting agitated with this RN's attempt for him to be seen here. Patient was wheeled upstairs with wife. Made one final attempt and this RN stated" I will be happy to take you down to the ED for you to be seen". Patient refused and wife witnessed his refusal.
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
## 2020-04-22 NOTE — Progress Notes (Signed)
Patient states that he cannot swallow.  PCP was notified to change the route of pain and nausea meds to IV. Awaiting new orders.

## 2020-04-22 NOTE — Procedures (Addendum)
Ultrasound-guided diagnostic and therapeutic right sided thoracentesis performed yielding 500 mililiters of amber colored fluid. No immediate complications.   Diagnostic fluid was sent to the lab for further analysis. Follow-up chest x-ray pending. EBL is < 2 ml.    Pateint arrived to the Interventional Radiology department with oxygen saturation on 81 on room air. Tachycardic at 122 and tachypnea. Patient unable to speak in full sentences. Recommended to the Patient and his Wife that patient be seen in ED for further evaluation and possible intervention.

## 2020-04-22 NOTE — Patient Instructions (Signed)

## 2020-04-22 NOTE — ED Triage Notes (Addendum)
Patient reports was at [facility] today for appointment and was sent for further evaluation of low oxygen saturation and tachycardia. Reports had thoracentesis today. Hx pancreatic cancer. Last chemo this morning. Wife at bedside.  Oxygen saturation 70% on room air.

## 2020-04-22 NOTE — Telephone Encounter (Signed)
Returned call to Ms. Grape. Informed Mrs. Harps of appointment today at [facility] to have a thoracentesis done at 3 pm. Informed patient wife patient should be at [facility] by 245 pm today. She voiced concerns of getting him in and out of car from the Cancer to [facility]. Asked if he should go by ambulance. Advised her that help is available to help get him in and out of car. She verbalized understanding.

## 2020-04-22 NOTE — Progress Notes (Signed)
[place] OFFICE PROGRESS NOTE   Diagnosis: Pancreas cancer  INTERVAL HISTORY:   Joe [name] returns for a scheduled visit.  He underwent a CT of the chest yesterday for evaluation of persistent dyspnea and tachycardia.  The CT showed no evidence of pulmonary embolism.  He has pleural effusions.  He reports adequate pain control with MS Contin and Dilaudid.  He is having bowel movements.  He takes Senokot, but no laxatives.  He has dyspnea.  No fever.  Objective:  Vital signs in last 24 hours:  Blood pressure 130/89, pulse (!) 120, temperature (!) 95.6 F (35.3 C), temperature source Axillary, resp. rate (!) 21, height 6\' 1"  (1.854 m), weight 156 lb 8 oz (71 kg), SpO2 (!) 84 %.    HEENT: Mild thrush at the left buccal mucosa, mild white coat over the tongue Resp: Decreased breath sounds at the lower posterior chest bilaterally, no respiratory distress, dullness to percussion at the right greater than left lower chest Cardio: Regular rate and rhythm, tachycardia GI: Mildly distended, tender in the mid upper abdomen Vascular: Pitting edema at the legs and feet bilaterally   Portacath/PICC-without erythema  Lab Results:  Lab Results  Component Value Date   WBC 21.2 (H) 05/05/2020   HGB 8.5 (L) 04/23/2020   HCT 24.1 (L) 04/18/2020   MCV 69.7 (L) 04/27/2020   PLT 553 (H) 04/15/2020   NEUTROABS 17.8 (H) 04/15/2020    CMP  Lab Results  Component Value Date   NA 133 (L) 04/29/2020   K 4.7 04/27/2020   CL 100 04/13/2020   CO2 24 04/27/2020   GLUCOSE 132 (H) 04/16/2020   BUN 16 04/18/2020   CREATININE 0.65 04/18/2020   CALCIUM 9.9 04/10/2020   PROT 6.1 (L) 04/13/2020   ALBUMIN 2.0 (L) 04/12/2020   AST 36 04/27/2020   ALT 13 05/01/2020   ALKPHOS 280 (H) 04/10/2020   BILITOT 0.8 05/06/2020   GFRNONAA >60 04/11/2020   GFRAA >60 05/09/2020      CT ANGIO CHEST PE W OR WO CONTRAST  Result Date: 04/21/2020 CLINICAL DATA:  Tachycardia and hypoxia  EXAM: CT ANGIOGRAPHY CHEST WITH CONTRAST TECHNIQUE: Multidetector CT imaging of the chest was performed using the standard protocol during bolus administration of intravenous contrast. Multiplanar CT image reconstructions and MIPs were obtained to evaluate the vascular anatomy. CONTRAST:  54mL OMNIPAQUE IOHEXOL 350 MG/ML SOLN COMPARISON:  03/30/2020 FINDINGS: Cardiovascular: Thoracic aorta demonstrates atherosclerotic calcifications without aneurysmal dilatation. Heart is not significantly enlarged. Pulmonary artery shows a normal branching pattern. No definitive filling defect to suggest pulmonary embolism is noted. Mediastinum/Nodes: Thoracic inlet shows a prominent 2 cm hypodense nodule in the left lobe of thyroid. Fullness in the right paratracheal region is noted consistent with lymphadenopathy. This measures approximately 18 mm in short axis. The esophagus is unremarkable. Lungs/Pleura: Large bilateral pleural effusions are noted. Compensatory lower lobe atelectatic changes are seen. No focal nodules are noted. No confluent infiltrate is seen. Upper Abdomen: Visualized upper abdomen demonstrates mild ascites. Additionally there are multiple hypodense lesions throughout the liver consistent with the given clinical history of pancreatic carcinoma and metastatic disease. Musculoskeletal: Mild degenerative change of the thoracic spine is noted. Review of the MIP images confirms the above findings. IMPRESSION: No evidence of pulmonary emboli. Diffuse hepatic metastatic disease consistent with the given clinical history. Large bilateral pleural effusions which were not visualized on the most recent CT examination. Mild ascites. Fullness in the right paratracheal region consistent with lymphadenopathy. Electronically Signed  By: Inez Catalina M.D.   On: 04/21/2020 12:59    Medications: I have reviewed the patient's current medications.   Assessment/Plan: 1. Pancreas cancer-stage IV ? CT abdomen/pelvis  01/22/2020-multiple hypoattenuating/hypoenhancing liver lesions-indeterminate ? CT abdomen/pelvis 02/27/2020-ill-defined pancreas head mass, new and enlarging extensive hepatic metastases, retroperitoneal adenopathy ? MRI abdomen 02/28/2020-mass at the junction and head of the pancreas body, no vascular invasion, multiple hepatic metastases, retroperitoneal adenopathy ? Ultrasound-guided biopsy of a right liver lesion 03/01/2020-poorly differentiated carcinoma with necrosis, cytokeratin 7, cytokeratin 20, and GATA-3 positive ? Cycle 1 gemcitabine/Abraxane 03/25/2020 ? CT abdomen/pelvis 03/28/2020-increased pancreas mass, increased size and number of liver metastases, progressive ascites, stable sclerotic bone lesions at the sacrum, right acetabulum, and proximal right femur, increased periaortic lymphadenopathy ? Cycle 2 gemcitabine/Abraxane 04/08/2020 ? CT chest 04/21/2020-negative for pulmonary embolism, diffuse hepatic metastatic disease, large bilateral pleural effusions, mild ascites, right paratracheal lymphadenopathy ? Cycle 3 gemcitabine/Abraxane 04/29/2020 2. Pain secondary to #1 3. Family history of cancer-father with adrenal cancer 4. Constipation-likely secondary to narcotic analgesics 5. Hypercalcemia 03/15/2020. Zometa 03/16/2020, 04/08/2020 6. Admission 03/29/2020 with abdominal pain, nausea, and dehydration 7. Leukocytosis-likely a leukemoid reaction related to cancer 8. History of fever-tumor fever?    Disposition: Joe [name] has completed 2 cycles of gemcitabine/Abraxane.  He has tolerated the chemotherapy well.  His pain is under better control.  He has increased dyspnea, likely multifactorial.  The large pleural effusions are likely contributing.  We will refer him for a diagnostic/therapeutic thoracentesis today.  He has home oxygen.  He agrees to a COVID-19 vaccine.  We will arrange this for within the next 1 week.  Joe [name] will return for an office visit next week.  He will be  scheduled for an office visit and chemotherapy in 2 weeks.  We discussed a potential change to comfort care if his clinical status does not improve further over the next few weeks.  He would like to continue chemotherapy for now.  Betsy Coder, MD  04/18/2020  10:00 AM

## 2020-04-22 NOTE — Telephone Encounter (Signed)
Error

## 2020-04-23 ENCOUNTER — Encounter (HOSPITAL_COMMUNITY): Payer: Self-pay | Admitting: Osteopathic Medicine

## 2020-04-23 DIAGNOSIS — L8990 Pressure ulcer of unspecified site, unspecified stage: Secondary | ICD-10-CM | POA: Insufficient documentation

## 2020-04-23 LAB — CBC
HCT: 22.9 % — ABNORMAL LOW (ref 39.0–52.0)
Hemoglobin: 8.1 g/dL — ABNORMAL LOW (ref 13.0–17.0)
MCH: 24.6 pg — ABNORMAL LOW (ref 26.0–34.0)
MCHC: 35.4 g/dL (ref 30.0–36.0)
MCV: 69.6 fL — ABNORMAL LOW (ref 80.0–100.0)
Platelets: 474 10*3/uL — ABNORMAL HIGH (ref 150–400)
RBC: 3.29 MIL/uL — ABNORMAL LOW (ref 4.22–5.81)
RDW: 15.9 % — ABNORMAL HIGH (ref 11.5–15.5)
WBC: 16.8 10*3/uL — ABNORMAL HIGH (ref 4.0–10.5)
nRBC: 0.2 % (ref 0.0–0.2)

## 2020-04-23 LAB — BASIC METABOLIC PANEL
Anion gap: 11 (ref 5–15)
BUN: 22 mg/dL — ABNORMAL HIGH (ref 6–20)
CO2: 27 mmol/L (ref 22–32)
Calcium: 9 mg/dL (ref 8.9–10.3)
Chloride: 99 mmol/L (ref 98–111)
Creatinine, Ser: 0.54 mg/dL — ABNORMAL LOW (ref 0.61–1.24)
GFR calc Af Amer: >60 mL/min (ref >60)
GFR calc non Af Amer: >60 mL/min (ref >60)
Glucose, Bld: 106 mg/dL — ABNORMAL HIGH (ref 70–99)
Potassium: 4.5 mmol/L (ref 3.5–5.1)
Sodium: 137 mmol/L (ref 135–145)

## 2020-04-23 LAB — CANCER ANTIGEN 19-9: CA 19-9: 29249 U/mL — ABNORMAL HIGH (ref 0–35)

## 2020-04-23 MED ORDER — ENSURE ENLIVE PO LIQD: 237.0000 mL | Freq: Three times a day (TID) | ORAL | Status: DC

## 2020-04-23 MED ORDER — HYDROMORPHONE HCL 1 MG/ML PO LIQD
2.0000 mg | ORAL | Status: DC | PRN
  Administered 2020-04-23: 2 mg via ORAL
  Filled 2020-04-23: qty 2

## 2020-04-23 MED ORDER — HYDROMORPHONE HCL 1 MG/ML IJ SOLN
1.0000 mg | INTRAMUSCULAR | Status: DC | PRN
  Administered 2020-04-23 – 2020-04-26 (×21): 1 mg via INTRAVENOUS
  Filled 2020-04-23 (×22): qty 1

## 2020-04-23 MED ORDER — HYDROMORPHONE HCL 1 MG/ML IJ SOLN
1.0000 mg | Freq: Once | INTRAMUSCULAR | Status: AC
  Administered 2020-04-23: 1 mg via INTRAVENOUS
  Filled 2020-04-23: qty 1

## 2020-04-23 NOTE — Progress Notes (Signed)
I did spend 15 minutes subsequently at the bedside with the patient and his wife Verdis Frederickson discussing the overall plan of care It seems like Verdis Frederickson has a very strong [place] faith and is hoping for Mr. Limas to be healed and cured from his current metastatic end-stage pancreatic cancer It is hoped that obviously our medical suspicion and fears can be superseded by their faith but quite unlikely and I have had a very frank discussion with the family about the patient likely succumbing in the near short-term to complications of this disease I shared wholeheartedly with them that I to have faith in God but that we have to also look for signs from God such as worsening illness etc. etc. and have encouraged him to think carefully about CODE STATUS discussions I was quite frank about what a CODE BLUE could look like for Mr. Mcmanaway and expressed my fears that we would do more harm rather than helping him in the process  I will reach out to their daughter later on today or tomorrow morning to see what collective decision making they as a family have made

## 2020-04-23 NOTE — Progress Notes (Signed)
Pt declining PO Medications Pt states "I gag when I take them" Will update MD

## 2020-04-23 NOTE — Progress Notes (Signed)
PROGRESS NOTE    Joe [name]  OIZ:[id number] DOB: 03-14-1961 DOA: 04/13/2020 PCP: Patient, No Pcp Per  Brief Narrative:  58 bm metastatic stage IV pancreatic cancer recent admit 719-7/29 discharged with palliative care Chronic leukocytosis 3 admissions since 02/28/2020 one of them he left AMA It appears he was given gemcitabine Abraxane 7/16 and placed on medication for pain control MS Contin fentanyl  Went to oncology office 8/13 and was recommended comfort care if his clinical status did not improve as he was quite tachypneic and found to have large pleural effusions for which thoracentesis 500 cc was performed 8/13 He was offered comfort care reportedly but declined-CT chest negative for PE    Assessment & Plan:   Principal Problem:   Hypoxia Active Problems:   Hyponatremia   Anemia   Pancreatic cancer ([place])   Leukocytosis   Malignant neoplasm of head of pancreas ([facility])   Failure to thrive in adult   Severe protein-calorie malnutrition ([place])   Cancer related pain   Pressure injury of skin   1. End-stage pancreatic cancer a. Patient NOT accepting palliative care and seems frustrated that me trying to introduce the same b. Pain is uncontrolled despite oral liquid Dilaudid c. I will start IV Dilaudid at his request 2. Likely pleural effusion related to malignancy a. Monitor by chest x-ray in a.m. to see if reaccumulates fluid b. It does not appear labs were sent to determine if this is infectious c. If he accumulates quickly we may need to have a discussion about a Pleurx catheter placement-we will try to reengage with him in the a.m. with regards to goals 3. Cancer related cachexia a. He is severely cachectic-nothing will stop his cancer from progressing with metastatic pancreatic cancer-I do not think supplements are appropriate 4. Severely microcytic anemia with thrombocytosis a. Probably a combination of poor nutrition and cancer b. See above discussion no further  work-up 5. Tachypnea tachycardia secondary to #2 6. Chronic leukocytosis probably secondary to his malignancy 7. Mild AKI 8. Full CODE STATUS-refuses to have further discussions about goals of care  DVT prophylaxis: Lovenox Code Status: Full code Historically has refused CODE STATUS discussions I think he is nearing his projective lifespan end despite his assurances that "I am going to return to work" We may need to discuss with his family going forward the patient's expectations versus start reality that he may decompensate over the next 1 to 2 weeks Family Communication: None present at the bedside  disposition: Inpatient not ready for discharge  Status is: Inpatient  Remains inpatient appropriate because:Persistent severe electrolyte disturbances, Ongoing diagnostic testing needed not appropriate for outpatient work up, Unsafe d/c plan and IV treatments appropriate due to intensity of illness or inability to take PO   Dispo: The patient is from: Home              Anticipated d/c is to: Unclear disposition at this time              Anticipated d/c date is: 3 days              Patient currently is not medically stable to d/c.       Consultants:   None  Procedures: None  Antimicrobials: None   Subjective: Patient's first question to me this morning is if he can have IV pain meds He states that oral Dilaudid is not helping him He absolutely refuses to discuss goals of care with me and seems insistent on directing  his care  Objective: Vitals:   04/24/2020 1958 04/11/2020 2019 05/08/2020 2157 04/23/20 0334  BP: 111/80  121/80 122/85  Pulse: (!) 112  (!) 121 (!) 117  Resp: 20  20 20   Temp: 98.4 F (36.9 C)  98 F (36.7 C) 98.4 F (36.9 C)  TempSrc: Oral  Oral   SpO2: 96%  95% 97%  Weight:  69.8 kg    Height: 6\' 1"  (1.854 m)       Intake/Output Summary (Last 24 hours) at 04/23/2020 0730 Last data filed at 04/15/2020 2100 Gross per 24 hour  Intake --  Output 600 ml   Net -600 ml   Filed Weights   04/21/2020 2019  Weight: 69.8 kg    Examination:  General exam: Extremely cachectic black male in some cardiopulmonary distress Respiratory system: No crackles Cardiovascular system: Tachycardic S1-S2 Gastrointestinal system: Patient refuses me to examine his abdomen. Central nervous system: Grossly intact moving all 4 limbs equally Extremities: ROM intact grossly but quite weak Skin: Grade 3-4 pitting edema lower extremities Psychiatry: Irritable and unwilling for me to examine him for the most part  Data Reviewed: I have personally reviewed following labs and imaging studies BUNs/creatinine 22/0.5/baseline seems to be 10/0.4 White count 16.8 Hemoglobin 8.1 consistent with prior Platelet 474  Radiology Studies: CT ANGIO CHEST PE W OR WO CONTRAST  Result Date: 04/21/2020 CLINICAL DATA:  Tachycardia and hypoxia EXAM: CT ANGIOGRAPHY CHEST WITH CONTRAST TECHNIQUE: Multidetector CT imaging of the chest was performed using the standard protocol during bolus administration of intravenous contrast. Multiplanar CT image reconstructions and MIPs were obtained to evaluate the vascular anatomy. CONTRAST:  40mL OMNIPAQUE IOHEXOL 350 MG/ML SOLN COMPARISON:  03/30/2020 FINDINGS: Cardiovascular: Thoracic aorta demonstrates atherosclerotic calcifications without aneurysmal dilatation. Heart is not significantly enlarged. Pulmonary artery shows a normal branching pattern. No definitive filling defect to suggest pulmonary embolism is noted. Mediastinum/Nodes: Thoracic inlet shows a prominent 2 cm hypodense nodule in the left lobe of thyroid. Fullness in the right paratracheal region is noted consistent with lymphadenopathy. This measures approximately 18 mm in short axis. The esophagus is unremarkable. Lungs/Pleura: Large bilateral pleural effusions are noted. Compensatory lower lobe atelectatic changes are seen. No focal nodules are noted. No confluent infiltrate is seen.  Upper Abdomen: Visualized upper abdomen demonstrates mild ascites. Additionally there are multiple hypodense lesions throughout the liver consistent with the given clinical history of pancreatic carcinoma and metastatic disease. Musculoskeletal: Mild degenerative change of the thoracic spine is noted. Review of the MIP images confirms the above findings. IMPRESSION: No evidence of pulmonary emboli. Diffuse hepatic metastatic disease consistent with the given clinical history. Large bilateral pleural effusions which were not visualized on the most recent CT examination. Mild ascites. Fullness in the right paratracheal region consistent with lymphadenopathy. Electronically Signed   By: Inez Catalina M.D.   On: 04/21/2020 12:59   DG Chest Port 1 View  Result Date: 05/06/2020 CLINICAL DATA:  Status post thoracentesis. EXAM: PORTABLE CHEST 1 VIEW COMPARISON:  03/30/2020 FINDINGS: Previously noted right pleural effusion has been evacuated. No pneumothorax. Small left pleural effusion has developed. No superimposed focal pulmonary infiltrate. Note right internal jugular chest port with its tip within the right atrium is unchanged. Cardiomediastinal silhouette unremarkable. Pulmonary vascularity is normal. No acute bone abnormality. IMPRESSION: 1. No pneumothorax following right thoracentesis. 2. Small left pleural effusion has developed. Electronically Signed   By: Fidela Salisbury MD   On: 04/13/2020 15:41   IR THORACENTESIS ASP PLEURAL SPACE W/IMG GUIDE  Result Date: 04/16/2020 INDICATION: Patient history of cancer pancreatic cancer with shortness of breath found to have a right-sided pleural effusion. Request is for therapeutic and diagnostic right-sided thoracentesis EXAM: ULTRASOUND GUIDED THERAPEUTIC AND DIAGNOSTIC THORACENTESIS MEDICATIONS: Lidocaine 1% 10 mL COMPLICATIONS: None immediate. Patient presented to interventional radiology department with oxygen saturation of 81 on room tachycardic in 82 with  tachypnea. Patient unable to speak in full sentences. Recommended 2 patient and wife the patient be seen in the ED post procedure for further evaluation and possible intervention. PROCEDURE: An ultrasound guided thoracentesis was thoroughly discussed with the patient and questions answered. The benefits, risks, alternatives and complications were also discussed. The patient understands and wishes to proceed with the procedure. Written consent was obtained. Ultrasound was performed to localize and mark an adequate pocket of fluid in the right chest. The area was then prepped and draped in the normal sterile fashion. 1% Lidocaine was used for local anesthesia. Under ultrasound guidance a 6 Fr Safe-T-Centesis catheter was introduced. Thoracentesis was performed. The catheter was removed and a dressing applied. FINDINGS: A total of approximately 500 mL of amber colored fluid was removed. Samples were sent to the laboratory as requested by the clinical team. IMPRESSION: Successful ultrasound guided right-sided therapeutic and diagnostic thoracentesis yielding 500 mL of pleural fluid. Read by: Rushie Nyhan, NP Electronically Signed   By: Aletta Edouard M.D.   On: 04/29/2020 16:14     Scheduled Meds: . Chlorhexidine Gluconate Cloth  6 each Topical Daily  . enoxaparin (LOVENOX) injection  40 mg Subcutaneous Q24H  . lipase/protease/amylase  36,000 Units Oral TID AC  . morphine  30 mg Oral Q8H  . multivitamin with minerals  1 tablet Oral Daily  . polyethylene glycol  17 g Oral Daily  . predniSONE  20 mg Oral Q breakfast  . senna-docusate  1 tablet Oral QHS  . sodium chloride flush  3 mL Intravenous Q12H   Continuous Infusions: . sodium chloride       LOS: 1 day    Time spent: 20 minutes  Nita Sells, MD [facility] To contact the attending provider between 7A-7P or the covering provider during after hours 7P-7A, please log into the web site [email or web address] and access using  universal [place] password for that web site. If you do not have the password, please call the hospital operator.  04/23/2020, 7:30 AM

## 2020-04-23 NOTE — Progress Notes (Signed)
Patient states that he is in a lot of pain (rated 10/10). The Dilaudid 1 mg IV push only helps briefly with pain management, PCP on call was notified. Awaiting new orders.

## 2020-04-23 NOTE — Progress Notes (Signed)
Initial Nutrition Assessment  DOCUMENTATION CODES:   Not applicable  INTERVENTION:  Ensure Enlive po TID, each supplement provides 350 kcal and 20 grams of protein Double protein portions with meals Magic cup TID with meals, each supplement provides 290 kcal and 9 grams of protein   NUTRITION DIAGNOSIS:   Increased nutrient needs related to cancer and cancer related treatments (metastatic pancreatic cancer currently on chemotherapy) as evidenced by estimated needs.    GOAL:   Patient will meet greater than or equal to 90% of their needs    MONITOR:   PO intake, Supplement acceptance, Weight trends, Labs  REASON FOR ASSESSMENT:   Malnutrition Screening Tool    ASSESSMENT:  RD working remotely.  59 year old male with history of metastatic pancreatic cancer followed by Dr. [name] presented with progressive shortness of breath s/p thoracentesis on 8/13 yielding 500 ml pleural fluid admitted with hypoxia.  8/13 - last chemotherapy (abraxane/gemzar)  Patient is on a regular diet, per flowsheets he consumed 0% of breakfast meal this morning. Per notes, pt declining po medications this morning due to swallowing difficulties, MD made aware. RD attempted to contact pt via phone this afternoon, however he did not pick up and unable to obtain nutrition history at this time. Patient is followed by outpatient RD at cancer center, last contact on 7/12. He reported enjoying fruit, occasionally drinking Ensure supplements. Will order Ensure as well as Magic Cup with meals to aid with meeting needs.    Per chart, weights have trended up ~15 lbs in the last month. Suspect this is likely due to fluid shifts, noted 3-4+ bilateral pitting edema as well as recent therapeutic  thoracentesis for pleural effusions Medications reviewed and include: Creon 36,000 units with meals, MS Contin, MVI, Miralax, Senokot, Prednisone  Labs: BUN 22 (H), Cr 0.54 (L) trending down, WBC 16.8 (H), Hgb 8.1 (L)  trending down  NUTRITION - FOCUSED PHYSICAL EXAM: Unable to complete at this time, RD working remotely.  Diet Order:   Diet Order            Diet regular Room service appropriate? Yes; Fluid consistency: Thin  Diet effective now                 EDUCATION NEEDS:   Not appropriate for education at this time  Skin:  Skin Assessment: Skin Integrity Issues: Skin Integrity Issues:: Stage II Stage II: mid sacrum  Last BM:  pta  Height:   Ht Readings from Last 1 Encounters:  05/03/2020 6\' 1"  (1.854 m)    Weight:   Wt Readings from Last 1 Encounters:  04/21/2020 69.8 kg    Ideal Body Weight:  83.6 kg  BMI:  Body mass index is 20.3 kg/m.  Estimated Nutritional Needs:   Kcal:  2100-2300  Protein:  105-115  Fluid:  > 2.1 L   Lajuan Lines, RD, LDN Clinical Nutrition After Hours/Weekend Pager # in [place]

## 2020-04-24 DIAGNOSIS — Z7189 Other specified counseling: Secondary | ICD-10-CM

## 2020-04-24 DIAGNOSIS — R627 Adult failure to thrive: Secondary | ICD-10-CM

## 2020-04-24 DIAGNOSIS — C250 Malignant neoplasm of head of pancreas: Principal | ICD-10-CM

## 2020-04-24 DIAGNOSIS — Z515 Encounter for palliative care: Secondary | ICD-10-CM

## 2020-04-24 MED ORDER — PREDNISONE 5 MG PO TABS: 5.0000 mg | ORAL_TABLET | Freq: Every day | ORAL | Status: DC

## 2020-04-24 NOTE — Consult Note (Addendum)
Consultation Note Date: 04/24/2020   Patient Name: Joe [name]  DOB: 09/09/1961  MRN: [id number]  Age / Sex: 59 y.o., male  PCP: Patient, No Pcp Per Referring Physician: Nita Sells, MD  Reason for Consultation: Establishing goals of care  HPI/Patient Profile: 59 y.o. male  with past medical history of stage IV pancreatic cancer, failure to thrive admitted on 04/18/2020 with shortness of breath s/p thoracentesis 500 mL for malignant pleural effusion followed by tachycardia and shortness of breath with symptoms improved with supplemental oxygen. Also with bilateral lower extremity edema secondary to failure to thrive with hypoalbumenia. Ongoing conversations regarding prognosis and wishes. Palliative care requested to assist with conversations.   I went to visit with Joe [name] today and introduced myself and he immediately shook his head no and told me that he does not want to talk. I attempted to inquire about his pain control and he says "I said, I do not want to talk." I assured him I just wanted to see if he needed any adjustment to control his symptoms but will respect his wishes and leave his room. I told him that I will keep him in my thoughts and prayers and he says "thank you." No family at bedside at current time. Will update attending. Consider spiritual care consult at later date.   I called and spoke with daughter/HCPOA, Joe [name]. Joe [name] has an excellent understanding of his poor prognosis and limited medical interventions to improve his situation. She understands that he will not benefit from further cancer treatment as the risks outweigh the benefits and it has been ineffective. The best we can offer is to treat his pain and any other symptom he has (i.e. insomnia, constipation, etc) to make his quality of life as good as possible. She knows that both of her parents have unwavering faith  and she respects this level of faith in them. We discussed how this complicates how we care for him and how he spends the time he has left. Discussed that I feel that God will perform miracles if this is in His will, with or without CPR, but that we should focus more on what he goes through and how he spends the time he has left and leave the rest in God's hands. Joe [name] agrees. We discussed that given his difficult situation and frustration in the hospital that getting him home would be the best option where he can be surrounded by the people that love him without the frustration of medical staff trying to force further conversation and constant reminders of his poor prognosis. We discuss having hospice support at home and Joe [name] feels this will be best and will speak with her parents about resuscitation and hospice support at home. We know that family will need support and assistance in caring for him at home and hospice can assist with symptom management and support to wife as well. I will leave further conversation in [place] and she will update me with their decisions today or tomorrow.   Exam: Patient  does not agree to examination. Frail, cachectic. Breathing appears regular, unlabored.   Plan: - Daughter, Joe [name], addressing goals of care and assisting with discussing decisions at hand. Will await return call from her.   35 min   Signed by: Vinie Sill, NP Palliative Medicine Team Pager # [phone] (M-F 8a-5p) Team Phone # [phone] (Nights/Weekends)

## 2020-04-24 NOTE — Progress Notes (Signed)
PROGRESS NOTE    Joe [name]  WLN:[id number] DOB: 1961-09-04 DOA: 04/27/2020 PCP: Patient, No Pcp Per  Brief Narrative:  58 bm metastatic stage IV pancreatic cancer recent admit 719-7/29 discharged with palliative care Chronic leukocytosis 3 admissions since 02/28/2020 one of them he left AMA It appears he was given gemcitabine Abraxane 7/16 and placed on medication for pain control MS Contin fentanyl  Went to oncology office 8/13 and was recommended comfort care if his clinical status did not improve as he was quite tachypneic and found to have large pleural effusions for which thoracentesis 500 cc was performed 8/13 He was offered comfort care reportedly but declined-CT chest negative for PE    Assessment & Plan:   Principal Problem:   Hypoxia Active Problems:   Hyponatremia   Anemia   Pancreatic cancer ([place])   Leukocytosis   Malignant neoplasm of head of pancreas ([facility])   Failure to thrive in adult   Severe protein-calorie malnutrition ([place])   Cancer related pain   Pressure injury of skin   1. End-stage pancreatic cancer a. Patient NOT accepting palliative care input-see their note from 8/15 b. Continue IV Dilaudid at this time but be careful of sedation 2. Likely pleural effusion related to malignancy a. Patient refused chest x-ray this morning b. We will engage with daughter and family again tomorrow 3. Cancer related cachexia and severe cancer related malnutrition a. He is severely cachectic-nothing will stop his cancer from progressing b. No supplements at this time 4. Severely microcytic anemia with thrombocytosis a. Probably a combination of poor nutrition and cancer b. See above discussion no further work-up 5. Tachypnea tachycardia secondary to #2 6. Chronic leukocytosis probably secondary to his malignancy 7. Mild AKI 8. Full CODE STATUS-refuses to have further discussions about goals of care  DVT prophylaxis: Lovenox Code Status: Full code Daughter  and palliative care have had a long discussion and we await further input with regards to the same Patient's prognosis is abysmal despite maximal medical efforts He is a very poor candidate for any type of chemo or radiation He has a high risk of dying within the next 7 to 14 days Family Communication: None present at the bedside  disposition: Inpatient not ready for discharge  Status is: Inpatient  Remains inpatient appropriate because:Persistent severe electrolyte disturbances, Ongoing diagnostic testing needed not appropriate for outpatient work up, Unsafe d/c plan and IV treatments appropriate due to intensity of illness or inability to take PO   Dispo: The patient is from: Home              Anticipated d/c is to: Unclear disposition at this time              Anticipated d/c date is: 3 days              Patient currently is not medically stable to d/c.       Consultants:   None  Procedures: None  Antimicrobials: None   Subjective: Resting comfortably at the bedside does not wish to engage eyes are closed throughout my interview with him He nods and says "yes" when I asked him if I can speak to his daughter Objective: Vitals:   04/23/20 1610 04/23/20 2110 04/24/20 0529 04/24/20 1218  BP: (!) 133/96 121/85 115/85 120/89  Pulse: (!) 115 (!) 120 100 (!) 121  Resp: (!) 24 20 20 20   Temp:  98 F (36.7 C) (!) 97.5 F (36.4 C) 97.7 F (36.5 C)  TempSrc:   Oral Oral  SpO2: 100% 100% 98% 100%  Weight:      Height:        Intake/Output Summary (Last 24 hours) at 04/24/2020 1417 Last data filed at 04/24/2020 1320 Gross per 24 hour  Intake 600 ml  Output 1125 ml  Net -525 ml   Filed Weights   04/21/2020 2019  Weight: 69.8 kg    Examination:  Asleep comfortably Chest clear Abdomen soft Cachectic  Data Reviewed: I have personally reviewed following labs and imaging studies BUN/creatinine 22/0.5 White count 16.8 Hemoglobin 8.1  Radiology Studies: DG Chest  Port 1 View  Result Date: 04/13/2020 CLINICAL DATA:  Status post thoracentesis. EXAM: PORTABLE CHEST 1 VIEW COMPARISON:  03/30/2020 FINDINGS: Previously noted right pleural effusion has been evacuated. No pneumothorax. Small left pleural effusion has developed. No superimposed focal pulmonary infiltrate. Note right internal jugular chest port with its tip within the right atrium is unchanged. Cardiomediastinal silhouette unremarkable. Pulmonary vascularity is normal. No acute bone abnormality. IMPRESSION: 1. No pneumothorax following right thoracentesis. 2. Small left pleural effusion has developed. Electronically Signed   By: Fidela Salisbury MD   On: 05/03/2020 15:41   IR THORACENTESIS ASP PLEURAL SPACE W/IMG GUIDE  Result Date: 04/21/2020 INDICATION: Patient history of cancer pancreatic cancer with shortness of breath found to have a right-sided pleural effusion. Request is for therapeutic and diagnostic right-sided thoracentesis EXAM: ULTRASOUND GUIDED THERAPEUTIC AND DIAGNOSTIC THORACENTESIS MEDICATIONS: Lidocaine 1% 10 mL COMPLICATIONS: None immediate. Patient presented to interventional radiology department with oxygen saturation of 81 on room tachycardic in 82 with tachypnea. Patient unable to speak in full sentences. Recommended 2 patient and wife the patient be seen in the ED post procedure for further evaluation and possible intervention. PROCEDURE: An ultrasound guided thoracentesis was thoroughly discussed with the patient and questions answered. The benefits, risks, alternatives and complications were also discussed. The patient understands and wishes to proceed with the procedure. Written consent was obtained. Ultrasound was performed to localize and mark an adequate pocket of fluid in the right chest. The area was then prepped and draped in the normal sterile fashion. 1% Lidocaine was used for local anesthesia. Under ultrasound guidance a 6 Fr Safe-T-Centesis catheter was introduced.  Thoracentesis was performed. The catheter was removed and a dressing applied. FINDINGS: A total of approximately 500 mL of amber colored fluid was removed. Samples were sent to the laboratory as requested by the clinical team. IMPRESSION: Successful ultrasound guided right-sided therapeutic and diagnostic thoracentesis yielding 500 mL of pleural fluid. Read by: Rushie Nyhan, NP Electronically Signed   By: Aletta Edouard M.D.   On: 04/25/2020 16:14     Scheduled Meds: . Chlorhexidine Gluconate Cloth  6 each Topical Daily  . enoxaparin (LOVENOX) injection  40 mg Subcutaneous Q24H  . feeding supplement (ENSURE ENLIVE)  237 mL Oral TID BM  . lipase/protease/amylase  36,000 Units Oral TID AC  . morphine  30 mg Oral Q8H  . multivitamin with minerals  1 tablet Oral Daily  . polyethylene glycol  17 g Oral Daily  . predniSONE  20 mg Oral Q breakfast  . senna-docusate  1 tablet Oral QHS  . sodium chloride flush  3 mL Intravenous Q12H   Continuous Infusions: . sodium chloride       LOS: 2 days    Time spent: 10 minutes  Nita Sells, MD [facility] To contact the attending provider between 7A-7P or the covering provider during after hours 7P-7A, please log  into the web site [email or web address] and access using universal [place] password for that web site. If you do not have the password, please call the hospital operator.  04/24/2020, 2:17 PM

## 2020-04-24 NOTE — Progress Notes (Signed)
°   04/23/20 2110  Assess: MEWS Score  Temp 98 F (36.7 C)  BP 121/85  Pulse Rate (!) 120  Resp 20  SpO2 100 %  Assess: MEWS Score  MEWS Temp 0  MEWS Systolic 0  MEWS Pulse 2  MEWS RR 0  MEWS LOC 0  MEWS Score 2  MEWS Score Color Yellow  Assess: if the MEWS score is Yellow or Red  Were vital signs taken at a resting state? Yes  Focused Assessment No change from prior assessment  Early Detection of Sepsis Score *See Row Information* Low  MEWS guidelines implemented *See Row Information* No, previously yellow, continue vital signs every 4 hours  Treat  MEWS Interventions Administered prn meds/treatments  Take Vital Signs  Increase Vital Sign Frequency   (continue q4 vitals)  Escalate  MEWS: Escalate Yellow: discuss with charge nurse/RN and consider discussing with provider and RRT  Notify: Charge Nurse/RN  Name of Charge Nurse/RN Notified  (Pt. remains in yellow. Charge RN's already aware.)  Notify: Provider  Provider Name/Title  (n/a; patient has consistently had these vitals. MD aware)  Notify: Rapid Response  Name of Rapid Response RN Notified  (n/a)  Document  Patient Outcome Other (Comment) (Patient is currently stable. Will continue to monitor. )  Progress note created (see row info) Yes

## 2020-04-25 ENCOUNTER — Telehealth: Payer: Self-pay | Admitting: Nurse Practitioner

## 2020-04-25 NOTE — Progress Notes (Signed)
Discussed with daughter and wife at bedside They have many questions about hospice benefits and planning in the outpatient setting-they do not think they are ready to make a decision about CODE STATUS changing today to DNR although daughter clearly seems to understand that patient has declined and may be close to end-of-life I have asked that they think through this carefully again as patient is noncommittal to doing invasive procedures and I think that is reasonable if we are going down a positive trajectory-I have requested that family think through decisions with the patient in terms of placement at home with hospice-I do not know if patient needs to be DNR to avail hospice services (I told him that he might need to be) and I will await TOC and palliative input with regards to options  I spent 15 minutes discussing these options with the patient and family

## 2020-04-25 NOTE — Telephone Encounter (Signed)
Scheduled appointments per 8/13 los. Spoke with patient's daughter who is aware of appointments dates and times.

## 2020-04-25 NOTE — Progress Notes (Signed)
PROGRESS NOTE  Joe [name]  NFA:[id number] DOB: March 08, 1961 DOA: 04/23/2020 PCP: Patient, No Pcp Per   Brief Narrative:  58 bm metastatic stage IV pancreatic cancer recent admit 719-7/29 discharged with palliative care Chronic leukocytosis 3 admissions since 02/28/2020 one of them he left AMA It appears he was given gemcitabine Abraxane 7/16 and placed on medication for pain control MS Contin fentanyl  Went to oncology office 8/13 and was recommended comfort care if his clinical status did not improve as he was quite tachypneic and found to have large pleural effusions for which thoracentesis 500 cc was performed 8/13 He was offered comfort care reportedly but declined-CT chest negative for PE    Assessment & Plan:   Principal Problem:   Hypoxia Active Problems:   Hyponatremia   Anemia   Pancreatic cancer ([place])   Leukocytosis   Malignant neoplasm of head of pancreas (HCC)   Failure to thrive in adult   Severe protein-calorie malnutrition ([place])   Cancer related pain   Pressure injury of skin   1. End-stage pancreatic cancer a. Patient NOT accepting palliative care input-see their note from 8/15 b. Continue IV Dilaudid may decide to switch back to oral given his somnolence today 8/16 as well as yesterday 2. Likely pleural effusion related to malignancy a. Patient refused chest x-ray this morning b. Patient is refusing essentially total care making monitoring of his disease process difficult c. He also refused discussion with oncology NP this morning 3. Cancer related cachexia and severe cancer related malnutrition a. He is severely cachectic-nothing will stop his cancer from progressing b. No supplements at this time 4. Severely microcytic anemia with thrombocytosis a. Probably a combination of poor nutrition and cancer b. See above discussion no further work-up 5. Tachypnea tachycardia secondary to #2 6. Chronic leukocytosis probably secondary to his  malignancy 7. Mild AKI 8. Full CODE STATUS-refuses to have further discussions about goals of care  DVT prophylaxis: Lovenox Code Status: Full code Daughter and palliative care have had a long discussion and we await further input with regards to the same I called his daughter today at phone [phone] and updated her Family Communication: See above  Disposition: Inpatient not ready for discharge  Status is: Inpatient  Remains inpatient appropriate because:Persistent severe electrolyte disturbances, Ongoing diagnostic testing needed not appropriate for outpatient work up, Unsafe d/c plan and IV treatments appropriate due to intensity of illness or inability to take PO  Dispo: The patient is from: Home              Anticipated d/c is to: Unclear disposition at this time              Anticipated d/c date is: 3 days              Patient currently is not medically stable to d/c.  Consultants:   None  Procedures: None  Antimicrobials: None   Subjective: Not engaging Keep eyes closed  Objective: Vitals:   04/23/20 2110 04/24/20 0529 04/24/20 1218 04/24/20 2151  BP: 121/85 115/85 120/89 125/82  Pulse: (!) 120 100 (!) 121 (!) 123  Resp: 20 20 20 20   Temp:  (!) 97.5 F (36.4 C) 97.7 F (36.5 C) 97.6 F (36.4 C)  TempSrc:  Oral Oral Oral  SpO2: 100% 98% 100% 100%  Weight:      Height:        Intake/Output Summary (Last 24 hours) at 04/25/2020 1420 Last data filed at 04/25/2020 0950 Gross per  24 hour  Intake 240 ml  Output 700 ml  Net -460 ml   Filed Weights   04/25/2020 2019  Weight: 69.8 kg    Examination:  Asleep comfortably Chest clear to my exam anteriorly Abdomen soft Cachectic  Data Reviewed: I have personally reviewed following labs and imaging studies BUN/creatinine 22/0.5 White count 16.8 Hemoglobin 8.1  Radiology Studies: No results found.   Scheduled Meds: . Chlorhexidine Gluconate Cloth  6 each Topical Daily  . enoxaparin (LOVENOX)  injection  40 mg Subcutaneous Q24H  . lipase/protease/amylase  36,000 Units Oral TID AC  . morphine  30 mg Oral Q8H  . polyethylene glycol  17 g Oral Daily  . predniSONE  5 mg Oral Q breakfast  . senna-docusate  1 tablet Oral QHS   Continuous Infusions:    LOS: 3 days    Time spent: 10 minutes  Nita Sells, MD [facility] To contact the attending provider between 7A-7P or the covering provider during after hours 7P-7A, please log into the web site [email or web address] and access using universal [place] password for that web site. If you do not have the password, please call the hospital operator.  04/25/2020, 2:20 PM

## 2020-04-25 NOTE — Progress Notes (Signed)
These preliminary result these preliminary results were noted.  Awaiting final report.

## 2020-04-25 NOTE — Progress Notes (Signed)
Palliative:  I reached out to daughter, Joe [name]. She reports ongoing discussions with her parents. Family coming in from out of town. She plans to discuss further with her father what he really wants as he is refusing care and if this is the case he is not benefiting from being in the hospital. She denies any further questions/concerns for me at this time. I will be available tomorrow as well.   No charge  Vinie Sill, NP Palliative Medicine Team Pager [phone] (Please see [email or web address] for schedule) Team Phone [phone]

## 2020-04-25 NOTE — TOC Progression Note (Signed)
Transition of Care [facility]) - Progression Note    Patient Details  Name: Joe [name] MRN: [id number] Date of Birth: 07-08-1961  [facility] CM/SW Contact  Clem Wisenbaker, Juliann Pulse, RN Phone Number: 04/25/2020, 3:25 PM  Clinical Narrative: Noted ongoing discussions with family from palliative care-await recc. Continue to monitor for d/c needs.           Expected Discharge Plan and Services                                                 Social Determinants of Health (SDOH) Interventions    Readmission Risk Interventions Readmission Risk Prevention Plan 04/06/2020  Transportation Screening Complete  Medication Review [employer]) Complete  PCP or Specialist appointment within 3-5 days of discharge Complete  HRI or [place] Complete  SW Recovery Care/Counseling Consult Complete  Palliative Care Screening Complete  [place] Not Applicable  Some recent data might be hidden

## 2020-04-25 NOTE — Progress Notes (Signed)
Pt declines care, he refuses to be turned or moved, refusing all medications other than IV dilaudid, also refusing vital signs.

## 2020-04-25 NOTE — Progress Notes (Addendum)
HEMATOLOGY-ONCOLOGY PROGRESS NOTE  SUBJECTIVE: The patient does not want to answer my questions today.  He indicates that he has answered these questions previously. However, he states that he wants pain medication.  He would not answer my question when I asked him what hurts.  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[place])   03/25/2020 -  Chemotherapy   The patient had PACLitaxel-protein bound (ABRAXANE) chemo infusion 175 mg, 100 mg/m2 = 175 mg (100 % of original dose 100 mg/m2), Intravenous,  Once, 2 of 4 cycles Dose modification: 100 mg/m2 (original dose 100 mg/m2, Cycle 1, Reason: Provider Judgment) Administration: 175 mg (03/25/2020), 175 mg (04/08/2020), 175 mg (04/21/2020) gemcitabine (GEMZAR) 1,824 mg in sodium chloride 0.9 % 250 mL chemo infusion, 1,000 mg/m2 = 1,824 mg, Intravenous,  Once, 2 of 4 cycles Administration: 1,824 mg (03/25/2020), 1,824 mg (04/08/2020), 1,824 mg (04/23/2020)  for chemotherapy treatment.      PHYSICAL EXAMINATION:  Vitals:   04/24/20 1218 04/24/20 2151  BP: 120/89 125/82  Pulse: (!) 121 (!) 123  Resp: 20 20  SpO2: 100% 100%   Filed Weights   04/24/2020 2019  Weight: 69.8 kg    Intake/Output from previous day: 08/15 0701 - 08/16 0700 In: 240 [P.O.:240] Out: 1175 [Urine:1175]  GENERAL: Chronically ill-appearing, cachectic HEENT: Mild thrush at the buccal mucosa bilaterally LUNGS: clear to auscultation and percussion with normal breathing effort HEART: Tachycardic ABDOMEN: Positive bowel sounds, nontender NEURO: Awake and alert, declines to answer questions  LABORATORY DATA:  I have reviewed the data as listed CMP Latest Ref Rng & Units 04/23/2020 05/05/2020 05/06/2020  Glucose 70 - 99 mg/dL 106(H) 132(H) -  BUN 6 - 20 mg/dL 22(H) 16 -  Creatinine 0.61 - 1.24 mg/dL 0.54(L) 0.60(L) 0.65  Sodium 135 - 145 mmol/L 137 133(L) -  Potassium 3.5 - 5.1 mmol/L  4.5 4.7 -  Chloride 98 - 111 mmol/L 99 100 -  CO2 22 - 32 mmol/L 27 24 -  Calcium 8.9 - 10.3 mg/dL 9.0 9.9 -  Total Protein 6.5 - 8.1 g/dL - 6.1(L) -  Total Bilirubin 0.3 - 1.2 mg/dL - 0.8 -  Alkaline Phos 38 - 126 U/L - 280(H) -  AST 15 - 41 U/L - 36 -  ALT 0 - 44 U/L - 13 -    Lab Results  Component Value Date   WBC 16.8 (H) 04/23/2020   HGB 8.1 (L) 04/23/2020   HCT 22.9 (L) 04/23/2020   MCV 69.6 (L) 04/23/2020   PLT 474 (H) 04/23/2020   NEUTROABS 17.8 (H) 05/02/2020    DG Chest 2 View  Result Date: 03/30/2020 CLINICAL DATA:  Metastatic pancreatic cancer. EXAM: CHEST - 2 VIEW COMPARISON:  None. FINDINGS: Low volume film with right base atelectasis or infiltrate in small right pleural effusion. Right Port-A-Cath tip overlies the right atrium. The visualized bony structures of the thorax show now acute abnormality. IMPRESSION: Low volume film with right base atelectasis or infiltrate and small right pleural effusion. Electronically Signed   By: Misty Stanley M.D.   On: 03/30/2020 11:59   DG Abd 1 View  Result Date: 04/02/2020 CLINICAL DATA:  Follow-up ileus. Upper abdominal pain, nausea and loose stools. EXAM: ABDOMEN - 1 VIEW COMPARISON:  04/01/2020 FINDINGS: Again seen is gaseous distension of the transverse colon. The appearance is similar to the previous exam with abrupt cut off at the level of the  proximal descending colon. No dilated small bowel loops No small bowel dilatation. Visualized osseous structures are unremarkable. IMPRESSION: Persistent gaseous distension of the transverse colon with abrupt cut off at the level of the proximal descending colon. Electronically Signed   By: Kerby Moors M.D.   On: 04/02/2020 10:49   DG Abd 1 View  Result Date: 04/01/2020 CLINICAL DATA:  Obstruction EXAM: ABDOMEN - 1 VIEW COMPARISON:  03/31/2020, 03/30/2020 FINDINGS: Gaseous distention of transverse and ascending colon. Abrupt cut off at proximal descending colon again seen. Cannot  exclude obstruction at the splenic flexure. Opacity projecting over mid abdomen question retained contrast versus superimposed radiopacity. No small bowel dilatation. Osseous structures unremarkable. Increased attenuation in the flanks suggesting ascites. IMPRESSION: Probable ascites. Abrupt cut off of gas-filled transverse colon at the splenic flexure, question colonic obstruction versus ileus. Electronically Signed   By: Lavonia Dana M.D.   On: 04/01/2020 09:15   DG Abd 1 View  Result Date: 03/31/2020 CLINICAL DATA:  Diffuse abdominal pain EXAM: ABDOMEN - 1 VIEW COMPARISON:  03/30/2020 FINDINGS: Gaseous distention of transverse colon. Small amount gas in rectum. Small bowel gas pattern normal. No bowel wall thickening. Osseous structures unremarkable. No urinary tract calcification. IMPRESSION: Mild gaseous distention of transverse colon without definite evidence of obstruction or wall thickening. Electronically Signed   By: Lavonia Dana M.D.   On: 03/31/2020 13:57   CT ANGIO CHEST PE W OR WO CONTRAST  Result Date: 04/21/2020 CLINICAL DATA:  Tachycardia and hypoxia EXAM: CT ANGIOGRAPHY CHEST WITH CONTRAST TECHNIQUE: Multidetector CT imaging of the chest was performed using the standard protocol during bolus administration of intravenous contrast. Multiplanar CT image reconstructions and MIPs were obtained to evaluate the vascular anatomy. CONTRAST:  59mL OMNIPAQUE IOHEXOL 350 MG/ML SOLN COMPARISON:  03/30/2020 FINDINGS: Cardiovascular: Thoracic aorta demonstrates atherosclerotic calcifications without aneurysmal dilatation. Heart is not significantly enlarged. Pulmonary artery shows a normal branching pattern. No definitive filling defect to suggest pulmonary embolism is noted. Mediastinum/Nodes: Thoracic inlet shows a prominent 2 cm hypodense nodule in the left lobe of thyroid. Fullness in the right paratracheal region is noted consistent with lymphadenopathy. This measures approximately 18 mm in short  axis. The esophagus is unremarkable. Lungs/Pleura: Large bilateral pleural effusions are noted. Compensatory lower lobe atelectatic changes are seen. No focal nodules are noted. No confluent infiltrate is seen. Upper Abdomen: Visualized upper abdomen demonstrates mild ascites. Additionally there are multiple hypodense lesions throughout the liver consistent with the given clinical history of pancreatic carcinoma and metastatic disease. Musculoskeletal: Mild degenerative change of the thoracic spine is noted. Review of the MIP images confirms the above findings. IMPRESSION: No evidence of pulmonary emboli. Diffuse hepatic metastatic disease consistent with the given clinical history. Large bilateral pleural effusions which were not visualized on the most recent CT examination. Mild ascites. Fullness in the right paratracheal region consistent with lymphadenopathy. Electronically Signed   By: Inez Catalina M.D.   On: 04/21/2020 12:59   CT Abdomen Pelvis W Contrast  Result Date: 03/28/2020 CLINICAL DATA:  Suspected bowel obstruction. EXAM: CT ABDOMEN AND PELVIS WITH CONTRAST TECHNIQUE: Multidetector CT imaging of the abdomen and pelvis was performed using the standard protocol following bolus administration of intravenous contrast. CONTRAST:  13mL OMNIPAQUE IOHEXOL 300 MG/ML  SOLN COMPARISON:  February 27, 2020 FINDINGS: Lower chest: Numerous 2 mm and 3 mm noncalcified lung nodules are seen within the bilateral lung bases. Hepatobiliary: Innumerable heterogeneous ill-defined low-attenuation liver lesions are seen scattered throughout the right and left lobes. These  are increased in size and number when compared to the prior study. No gallstones, gallbladder wall thickening, or biliary dilatation. Pancreas: A 3.9 cm x 3.7 cm lobulated heterogeneous low-attenuation mass is seen within the head of the pancreas. This is increased in size when compared to the prior exam. Spleen: Normal in size without focal abnormality.  Adrenals/Urinary Tract: Adrenal glands are unremarkable. Kidneys are normal, without renal calculi or hydronephrosis. A 1.6 cm cyst is seen within the lateral aspect of the mid right kidney. Bladder is unremarkable. Stomach/Bowel: Stomach is within normal limits. The appendix is not clearly identified. No evidence of bowel wall thickening, distention, or inflammatory changes. Vascular/Lymphatic: There is mild calcification of the abdominal aorta. Moderate severity para-aortic lymphadenopathy is seen which is increased in severity when compared to the prior study. Reproductive: There is moderate to marked severity prostate gland enlargement. Other: No abdominal wall hernia or abnormality. A moderate to marked amount of free fluid is seen within the abdomen and pelvis. This is markedly increased in severity when compared to the prior study. Musculoskeletal: A 0.9 cm sclerotic focus is seen within the mid to lower sacrum on the left. A 0.4 cm sclerotic focus is noted within the posteromedial aspect of the right acetabulum. An additional 0.8 cm sclerotic focus is seen within the proximal right femur. These areas are all stable in size and appearance when compared to the prior study. IMPRESSION: 1. Innumerable heterogeneous ill-defined low-attenuation liver lesions which are increased in size and number when compared to the prior study, consistent with worsening metastatic disease. 2. Interval increase in size of a lobulated heterogeneous low-attenuation mass within the head of the pancreas, consistent with primary pancreatic neoplasm. 3. Moderate severity para-aortic lymphadenopathy, increased in severity when compared to the prior study. 4. Numerous 2 mm and 3 mm noncalcified lung nodules within the bilateral lung bases. These findings are concerning for metastatic disease. 5. Moderate to marked severity prostate gland enlargement. 6. Stable sclerotic foci within the mid to lower sacrum on the left, posteromedial  aspect of the right acetabulum, and proximal right femur. These areas are all stable in size and appearance when compared to the prior study. 7. Moderate to marked amount of free fluid within the abdomen and pelvis which is markedly increased in severity when compared to the prior study. 8. Aortic atherosclerosis. Aortic Atherosclerosis (ICD10-I70.0). Electronically Signed   By: Virgina Norfolk M.D.   On: 03/28/2020 20:24   DG Chest Port 1 View  Result Date: 04/20/2020 CLINICAL DATA:  Status post thoracentesis. EXAM: PORTABLE CHEST 1 VIEW COMPARISON:  03/30/2020 FINDINGS: Previously noted right pleural effusion has been evacuated. No pneumothorax. Small left pleural effusion has developed. No superimposed focal pulmonary infiltrate. Note right internal jugular chest port with its tip within the right atrium is unchanged. Cardiomediastinal silhouette unremarkable. Pulmonary vascularity is normal. No acute bone abnormality. IMPRESSION: 1. No pneumothorax following right thoracentesis. 2. Small left pleural effusion has developed. Electronically Signed   By: Fidela Salisbury MD   On: 05/09/2020 15:41   DG Abd 2 Views  Result Date: 03/30/2020 CLINICAL DATA:  Metastatic pancreatic cancer with recurrent nausea and vomiting. EXAM: ABDOMEN - 2 VIEW COMPARISON:  CT scan 03/28/2020 FINDINGS: Two views study shows no intraperitoneal free air. Gaseous distention of small bowel and colon is evident. Centralization of bowel loops suggests ascites. No worrisome lytic or sclerotic osseous abnormality. IMPRESSION: Gaseous distention of small bowel and colon. Imaging features suggest ileus. No intraperitoneal free air Electronically Signed  By: Misty Stanley M.D.   On: 03/30/2020 11:58   IR THORACENTESIS ASP PLEURAL SPACE W/IMG GUIDE  Result Date: 05/08/2020 INDICATION: Patient history of cancer pancreatic cancer with shortness of breath found to have a right-sided pleural effusion. Request is for therapeutic and  diagnostic right-sided thoracentesis EXAM: ULTRASOUND GUIDED THERAPEUTIC AND DIAGNOSTIC THORACENTESIS MEDICATIONS: Lidocaine 1% 10 mL COMPLICATIONS: None immediate. Patient presented to interventional radiology department with oxygen saturation of 81 on room tachycardic in 82 with tachypnea. Patient unable to speak in full sentences. Recommended 2 patient and wife the patient be seen in the ED post procedure for further evaluation and possible intervention. PROCEDURE: An ultrasound guided thoracentesis was thoroughly discussed with the patient and questions answered. The benefits, risks, alternatives and complications were also discussed. The patient understands and wishes to proceed with the procedure. Written consent was obtained. Ultrasound was performed to localize and mark an adequate pocket of fluid in the right chest. The area was then prepped and draped in the normal sterile fashion. 1% Lidocaine was used for local anesthesia. Under ultrasound guidance a 6 Fr Safe-T-Centesis catheter was introduced. Thoracentesis was performed. The catheter was removed and a dressing applied. FINDINGS: A total of approximately 500 mL of amber colored fluid was removed. Samples were sent to the laboratory as requested by the clinical team. IMPRESSION: Successful ultrasound guided right-sided therapeutic and diagnostic thoracentesis yielding 500 mL of pleural fluid. Read by: Rushie Nyhan, NP Electronically Signed   By: Aletta Edouard M.D.   On: 04/19/2020 16:14    ASSESSMENT AND PLAN: 1. Pancreas cancer-stage IV ? CT abdomen/pelvis 01/22/2020-multiple hypoattenuating/hypoenhancing liver lesions-indeterminate ? CT abdomen/pelvis 02/27/2020-ill-defined pancreas head mass, new and enlarging extensive hepatic metastases, retroperitoneal adenopathy ? MRI abdomen 02/28/2020-mass at the junction and head of the pancreas body, no vascular invasion, multiple hepatic metastases, retroperitoneal adenopathy ? Ultrasound-guided  biopsy of a right liver lesion 03/01/2020-poorly differentiated carcinoma with necrosis, cytokeratin 7, cytokeratin 20, and GATA-3 positive ? Cycle 1 gemcitabine/Abraxane 03/25/2020 ? CT abdomen/pelvis 03/28/2020-increased pancreas mass, increased size and number of liver metastases, progressive ascites, stable sclerotic bone lesions at the sacrum, right acetabulum, and proximal right femur, increased periaortic lymphadenopathy ? Cycle 2 gemcitabine/Abraxane 04/08/2020 ? CT chest 04/21/2020-negative for pulmonary embolism, diffuse hepatic metastatic disease, large bilateral pleural effusions, mild ascites, right paratracheal lymphadenopathy ? Cycle 3 gemcitabine/Abraxane 04/21/2020 2. Pain secondary to #1 3. Family history of cancer-father with adrenal cancer 4. Constipation-likely secondary to narcotic analgesics 5. Hypercalcemia 03/15/2020. [place] 03/16/2020, 04/08/2020 6. Admission 03/29/2020 with abdominal pain, nausea, and dehydration 7. Leukocytosis-likely a leukemoid reaction related to cancer 8. History of fever-tumor fever? 9. Hospital admission 04/10/2020-hypoxia, pleural effusion, anemia, AKI  Joe [name] is now readmitted due to hypoxia.  He underwent thoracentesis just prior to admission on 04/14/2020 which yielded 500 cc of pleural fluid.  Cytology pending.  His breathing appears stable at this time but he remains tachycardic.    He continues to have persistent pain and is requesting pain medications, but will not fully discuss his pain management.  He has persistent leukocytosis, anemia, and thrombocytosis.  Leukocytosis likely leukemoid reaction related to cancer.  Anemia likely related to underlying malignancy and recent chemotherapy.  Thrombocytosis likely reactive.  We have discussed comfort measures with the patient and he continues to desire additional chemotherapy.  We will have ongoing discussions regarding CODE STATUS.  Recommendations: 1.  Will have further discussions regarding  CODE STATUS. 2.  He continues to desire additional chemotherapy.  Appreciate assistance from palliative  care team regarding goals of care discussion. 3.  Follow-up cytology from thoracentesis on 04/23/2020. 4.  Continue current pain medications.   LOS: 3 days   [name], DNP, AGPCNP-BC, AOCNP 04/25/20 Joe [name] was interviewed and examined.  I saw him early this morning and again at approximately 5:45 PM.  His family members including his daughter, son, and wife were present this evening.  Joe [name] has metastatic pancreas cancer.  He has completed 3 treatments with gemcitabine/Abraxane.  His performance status has declined.  The CA 19-9 was higher on 05/03/2020.  The etiology of the hypoxia is unclear, but I am concerned he may have lymphatic tumor spread in the lungs.  I discussed the poor prognosis with Joe [name] and his family.  We discussed comfort/hospice care.  We also discussed CODE STATUS.  Joe [name] is considering home hospice care and will discuss these issues further with his family.  His family will be here again tomorrow morning.  He understands that he will not be a candidate for further chemotherapy unless his performance status improves.  We can consider an increased dose of MS Contin or changing to a Dilaudid or morphine PCA if he goes home with hospice.  Julieanne Manson, MD

## 2020-04-26 ENCOUNTER — Telehealth: Payer: Self-pay

## 2020-04-26 DIAGNOSIS — C259 Malignant neoplasm of pancreas, unspecified: Secondary | ICD-10-CM

## 2020-04-26 MED ORDER — SODIUM CHLORIDE 0.9 % IV SOLN: 0.5000 mg/h | INTRAVENOUS | 0 refills | Status: DC

## 2020-04-26 MED ORDER — FLUCONAZOLE 50 MG PO TABS
50.0000 mg | ORAL_TABLET | Freq: Every day | ORAL | Status: DC
  Filled 2020-04-26 (×3): qty 1

## 2020-04-26 MED ORDER — SODIUM CHLORIDE 0.9 % IV SOLN: 0.5000 mg/h | INTRAVENOUS | 0 refills | Status: AC

## 2020-04-26 MED ORDER — SODIUM CHLORIDE 0.9 % IV SOLN
1.0000 mg/h | INTRAVENOUS | Status: DC
  Administered 2020-04-26: 1 mg/h via INTRAVENOUS
  Filled 2020-04-26 (×4): qty 5

## 2020-04-26 MED ORDER — HYDROMORPHONE HCL 1 MG/ML IJ SOLN: 1.0000 mg | INTRAMUSCULAR | 0 refills | Status: AC | PRN

## 2020-04-26 MED ORDER — HYDROMORPHONE HCL 1 MG/ML IJ SOLN: 1.0000 mg | INTRAMUSCULAR | Status: DC | PRN

## 2020-04-26 NOTE — Progress Notes (Signed)
Palliative:  HPI: 59 y.o. male  with past medical history of stage IV pancreatic cancer, failure to thrive admitted on 05/07/2020 with shortness of breath s/p thoracentesis 500 mL for malignant pleural effusion followed by tachycardia and shortness of breath with symptoms improved with supplemental oxygen. Also with bilateral lower extremity edema secondary to failure to thrive with hypoalbumenia. Ongoing conversations regarding prognosis and wishes. Palliative care requested to assist with conversations.    I met today at Mr. Kishimoto bedside after being updated by [name], NP oncology to plan. Daughter, [name], is at bedside and she confirms plan for DNR and to return home with hospice support. They have no equipment currently at home. They are anxious about this transition. We discussed transport home with non-emergent ambulance to go from bed to stretcher to bed at home. Dilaudid infusion has began. I will be happy to work with hospice to get the current prescription information and to coordinate this to be arranged for use at home. There was some confusion regarding HCPOA/Advanced Directives and reassured [name] that as long as family is on the same page with the same goals that this is not a concern. Discussed that legally the next point of contact is wife but if her parents wish for her to be the one to discuss and coordinate this is not a problem.   [name] appears very overwhelmed. These past few days have been extremely difficult on her family. I reassured her that she has been doing an amazing job helping and advocating for her parents. I also encouraged her to take the time to process and deal with her own grief. She assures me that she has an excellent support system. She is also interested in making sure her mother is connected with grief/bereavement support which hospice can assist as well.   All questions/concerns addressed. Emotional support provided. Discussed with [facility] to keep me  updated on hospice process so I can assist with infusion prescription as needed.   Exam: Thin, frail. Resting comfortably. No distress. Breathing regular, unlabored. Abd flat.   Plan: - Home with hospice.  - DNR confirmed.   15 min  Vinie Sill, NP Palliative Medicine Team Pager [phone] (Please see [email or web address] for schedule) Team Phone [phone]    Greater than 50%  of this time was spent counseling and coordinating care related to the above assessment and plan

## 2020-04-26 NOTE — Progress Notes (Signed)
Chaplain visited while daughter was bedside.  Patient was asleep.  [name], the oldest of three children, lives in [place], and has come up to help her family with this transition.  She said her mother was older than her father.  She is from [place], [place].  She is mulltling[place]. But there are communication issues which led the daughter to assume responsibility for family matters. Chaplain said that the patient appeared earlier today unable or unwilling to go through the process of executing legal documents, and the daughter agreed. .  Daughter said, "oh no, I don't think he could do that now."  The daughter wishes to the primary point person going forward in accordance her parent's wishes, and chaplain assured her that should not be a problem. She was feeling pressured to the get legal matters taken care of, but did not realize that the forms offered were for Healthcare only and did not cover the spectrum of legal matters. The family is very [place].  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 The daughter desire support for her mother and the chaplain assured her that [place] would be offering support to her. The chaplain continued to listen and then pray.   Rev. Tamsen Snider Pager [phone]

## 2020-04-26 NOTE — Progress Notes (Addendum)
HEMATOLOGY-ONCOLOGY PROGRESS NOTE  SUBJECTIVE: The patient is resting quietly in his room.  He still has ongoing pain.  Wife and daughter at the bedside.  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[place])   03/25/2020 -  Chemotherapy   The patient had PACLitaxel-protein bound (ABRAXANE) chemo infusion 175 mg, 100 mg/m2 = 175 mg (100 % of original dose 100 mg/m2), Intravenous,  Once, 2 of 4 cycles Dose modification: 100 mg/m2 (original dose 100 mg/m2, Cycle 1, Reason: Provider Judgment) Administration: 175 mg (03/25/2020), 175 mg (04/08/2020), 175 mg (05/06/2020) gemcitabine (GEMZAR) 1,824 mg in sodium chloride 0.9 % 250 mL chemo infusion, 1,000 mg/m2 = 1,824 mg, Intravenous,  Once, 2 of 4 cycles Administration: 1,824 mg (03/25/2020), 1,824 mg (04/08/2020), 1,824 mg (05/05/2020)  for chemotherapy treatment.      PHYSICAL EXAMINATION:  Vitals:   04/26/20 0628 04/26/20 0932  BP: 113/82 110/79  Pulse: (!) 124 (!) 125  Resp: 14 (!) 22  Temp: 98.6 F (37 C)   SpO2: 100% 100%   Filed Weights   05/10/2020 2019  Weight: 69.8 kg    Intake/Output from previous day: 08/16 0701 - 08/17 0700 In: 73 [P.O.:880] Out: 650 [Urine:650]  GENERAL: Chronically ill-appearing, cachectic HEENT: Thrush at the tongue and buccal mucosa LUNGS: clear to auscultation and percussion with normal breathing effort HEART: Tachycardic ABDOMEN: Positive bowel sounds, nontender NEURO: Awake and alert, declines to answer questions  LABORATORY DATA:  I have reviewed the data as listed CMP Latest Ref Rng & Units 04/23/2020 05/01/2020 04/15/2020  Glucose 70 - 99 mg/dL 106(H) 132(H) -  BUN 6 - 20 mg/dL 22(H) 16 -  Creatinine 0.61 - 1.24 mg/dL 0.54(L) 0.60(L) 0.65  Sodium 135 - 145 mmol/L 137 133(L) -  Potassium 3.5 - 5.1 mmol/L 4.5 4.7 -  Chloride 98 - 111 mmol/L 99 100 -  CO2 22 - 32 mmol/L 27 24 -  Calcium 8.9 - 10.3 mg/dL  9.0 9.9 -  Total Protein 6.5 - 8.1 g/dL - 6.1(L) -  Total Bilirubin 0.3 - 1.2 mg/dL - 0.8 -  Alkaline Phos 38 - 126 U/L - 280(H) -  AST 15 - 41 U/L - 36 -  ALT 0 - 44 U/L - 13 -    Lab Results  Component Value Date   WBC 16.8 (H) 04/23/2020   HGB 8.1 (L) 04/23/2020   HCT 22.9 (L) 04/23/2020   MCV 69.6 (L) 04/23/2020   PLT 474 (H) 04/23/2020   NEUTROABS 17.8 (H) 05/10/2020    DG Chest 2 View  Result Date: 03/30/2020 CLINICAL DATA:  Metastatic pancreatic cancer. EXAM: CHEST - 2 VIEW COMPARISON:  None. FINDINGS: Low volume film with right base atelectasis or infiltrate in small right pleural effusion. Right Port-A-Cath tip overlies the right atrium. The visualized bony structures of the thorax show now acute abnormality. IMPRESSION: Low volume film with right base atelectasis or infiltrate and small right pleural effusion. Electronically Signed   By: Misty Stanley M.D.   On: 03/30/2020 11:59   DG Abd 1 View  Result Date: 04/02/2020 CLINICAL DATA:  Follow-up ileus. Upper abdominal pain, nausea and loose stools. EXAM: ABDOMEN - 1 VIEW COMPARISON:  04/01/2020 FINDINGS: Again seen is gaseous distension of the transverse colon. The appearance is similar to the previous exam with abrupt cut off at the level of the proximal descending colon. No dilated small bowel loops No small bowel dilatation.  Visualized osseous structures are unremarkable. IMPRESSION: Persistent gaseous distension of the transverse colon with abrupt cut off at the level of the proximal descending colon. Electronically Signed   By: Kerby Moors M.D.   On: 04/02/2020 10:49   DG Abd 1 View  Result Date: 04/01/2020 CLINICAL DATA:  Obstruction EXAM: ABDOMEN - 1 VIEW COMPARISON:  03/31/2020, 03/30/2020 FINDINGS: Gaseous distention of transverse and ascending colon. Abrupt cut off at proximal descending colon again seen. Cannot exclude obstruction at the splenic flexure. Opacity projecting over mid abdomen question retained contrast  versus superimposed radiopacity. No small bowel dilatation. Osseous structures unremarkable. Increased attenuation in the flanks suggesting ascites. IMPRESSION: Probable ascites. Abrupt cut off of gas-filled transverse colon at the splenic flexure, question colonic obstruction versus ileus. Electronically Signed   By: Lavonia Dana M.D.   On: 04/01/2020 09:15   DG Abd 1 View  Result Date: 03/31/2020 CLINICAL DATA:  Diffuse abdominal pain EXAM: ABDOMEN - 1 VIEW COMPARISON:  03/30/2020 FINDINGS: Gaseous distention of transverse colon. Small amount gas in rectum. Small bowel gas pattern normal. No bowel wall thickening. Osseous structures unremarkable. No urinary tract calcification. IMPRESSION: Mild gaseous distention of transverse colon without definite evidence of obstruction or wall thickening. Electronically Signed   By: Lavonia Dana M.D.   On: 03/31/2020 13:57   CT ANGIO CHEST PE W OR WO CONTRAST  Result Date: 04/21/2020 CLINICAL DATA:  Tachycardia and hypoxia EXAM: CT ANGIOGRAPHY CHEST WITH CONTRAST TECHNIQUE: Multidetector CT imaging of the chest was performed using the standard protocol during bolus administration of intravenous contrast. Multiplanar CT image reconstructions and MIPs were obtained to evaluate the vascular anatomy. CONTRAST:  81mL OMNIPAQUE IOHEXOL 350 MG/ML SOLN COMPARISON:  03/30/2020 FINDINGS: Cardiovascular: Thoracic aorta demonstrates atherosclerotic calcifications without aneurysmal dilatation. Heart is not significantly enlarged. Pulmonary artery shows a normal branching pattern. No definitive filling defect to suggest pulmonary embolism is noted. Mediastinum/Nodes: Thoracic inlet shows a prominent 2 cm hypodense nodule in the left lobe of thyroid. Fullness in the right paratracheal region is noted consistent with lymphadenopathy. This measures approximately 18 mm in short axis. The esophagus is unremarkable. Lungs/Pleura: Large bilateral pleural effusions are noted. Compensatory  lower lobe atelectatic changes are seen. No focal nodules are noted. No confluent infiltrate is seen. Upper Abdomen: Visualized upper abdomen demonstrates mild ascites. Additionally there are multiple hypodense lesions throughout the liver consistent with the given clinical history of pancreatic carcinoma and metastatic disease. Musculoskeletal: Mild degenerative change of the thoracic spine is noted. Review of the MIP images confirms the above findings. IMPRESSION: No evidence of pulmonary emboli. Diffuse hepatic metastatic disease consistent with the given clinical history. Large bilateral pleural effusions which were not visualized on the most recent CT examination. Mild ascites. Fullness in the right paratracheal region consistent with lymphadenopathy. Electronically Signed   By: Inez Catalina M.D.   On: 04/21/2020 12:59   CT Abdomen Pelvis W Contrast  Result Date: 03/28/2020 CLINICAL DATA:  Suspected bowel obstruction. EXAM: CT ABDOMEN AND PELVIS WITH CONTRAST TECHNIQUE: Multidetector CT imaging of the abdomen and pelvis was performed using the standard protocol following bolus administration of intravenous contrast. CONTRAST:  147mL OMNIPAQUE IOHEXOL 300 MG/ML  SOLN COMPARISON:  February 27, 2020 FINDINGS: Lower chest: Numerous 2 mm and 3 mm noncalcified lung nodules are seen within the bilateral lung bases. Hepatobiliary: Innumerable heterogeneous ill-defined low-attenuation liver lesions are seen scattered throughout the right and left lobes. These are increased in size and number when compared to the prior study.  No gallstones, gallbladder wall thickening, or biliary dilatation. Pancreas: A 3.9 cm x 3.7 cm lobulated heterogeneous low-attenuation mass is seen within the head of the pancreas. This is increased in size when compared to the prior exam. Spleen: Normal in size without focal abnormality. Adrenals/Urinary Tract: Adrenal glands are unremarkable. Kidneys are normal, without renal calculi or  hydronephrosis. A 1.6 cm cyst is seen within the lateral aspect of the mid right kidney. Bladder is unremarkable. Stomach/Bowel: Stomach is within normal limits. The appendix is not clearly identified. No evidence of bowel wall thickening, distention, or inflammatory changes. Vascular/Lymphatic: There is mild calcification of the abdominal aorta. Moderate severity para-aortic lymphadenopathy is seen which is increased in severity when compared to the prior study. Reproductive: There is moderate to marked severity prostate gland enlargement. Other: No abdominal wall hernia or abnormality. A moderate to marked amount of free fluid is seen within the abdomen and pelvis. This is markedly increased in severity when compared to the prior study. Musculoskeletal: A 0.9 cm sclerotic focus is seen within the mid to lower sacrum on the left. A 0.4 cm sclerotic focus is noted within the posteromedial aspect of the right acetabulum. An additional 0.8 cm sclerotic focus is seen within the proximal right femur. These areas are all stable in size and appearance when compared to the prior study. IMPRESSION: 1. Innumerable heterogeneous ill-defined low-attenuation liver lesions which are increased in size and number when compared to the prior study, consistent with worsening metastatic disease. 2. Interval increase in size of a lobulated heterogeneous low-attenuation mass within the head of the pancreas, consistent with primary pancreatic neoplasm. 3. Moderate severity para-aortic lymphadenopathy, increased in severity when compared to the prior study. 4. Numerous 2 mm and 3 mm noncalcified lung nodules within the bilateral lung bases. These findings are concerning for metastatic disease. 5. Moderate to marked severity prostate gland enlargement. 6. Stable sclerotic foci within the mid to lower sacrum on the left, posteromedial aspect of the right acetabulum, and proximal right femur. These areas are all stable in size and appearance  when compared to the prior study. 7. Moderate to marked amount of free fluid within the abdomen and pelvis which is markedly increased in severity when compared to the prior study. 8. Aortic atherosclerosis. Aortic Atherosclerosis (ICD10-I70.0). Electronically Signed   By: Virgina Norfolk M.D.   On: 03/28/2020 20:24   DG Chest Port 1 View  Result Date: 05/03/2020 CLINICAL DATA:  Status post thoracentesis. EXAM: PORTABLE CHEST 1 VIEW COMPARISON:  03/30/2020 FINDINGS: Previously noted right pleural effusion has been evacuated. No pneumothorax. Small left pleural effusion has developed. No superimposed focal pulmonary infiltrate. Note right internal jugular chest port with its tip within the right atrium is unchanged. Cardiomediastinal silhouette unremarkable. Pulmonary vascularity is normal. No acute bone abnormality. IMPRESSION: 1. No pneumothorax following right thoracentesis. 2. Small left pleural effusion has developed. Electronically Signed   By: Fidela Salisbury MD   On: 05/06/2020 15:41   DG Abd 2 Views  Result Date: 03/30/2020 CLINICAL DATA:  Metastatic pancreatic cancer with recurrent nausea and vomiting. EXAM: ABDOMEN - 2 VIEW COMPARISON:  CT scan 03/28/2020 FINDINGS: Two views study shows no intraperitoneal free air. Gaseous distention of small bowel and colon is evident. Centralization of bowel loops suggests ascites. No worrisome lytic or sclerotic osseous abnormality. IMPRESSION: Gaseous distention of small bowel and colon. Imaging features suggest ileus. No intraperitoneal free air Electronically Signed   By: Misty Stanley M.D.   On: 03/30/2020 11:58  IR THORACENTESIS ASP PLEURAL SPACE W/IMG GUIDE  Result Date: 05/05/2020 INDICATION: Patient history of cancer pancreatic cancer with shortness of breath found to have a right-sided pleural effusion. Request is for therapeutic and diagnostic right-sided thoracentesis EXAM: ULTRASOUND GUIDED THERAPEUTIC AND DIAGNOSTIC THORACENTESIS MEDICATIONS:  Lidocaine 1% 10 mL COMPLICATIONS: None immediate. Patient presented to interventional radiology department with oxygen saturation of 81 on room tachycardic in 82 with tachypnea. Patient unable to speak in full sentences. Recommended 2 patient and wife the patient be seen in the ED post procedure for further evaluation and possible intervention. PROCEDURE: An ultrasound guided thoracentesis was thoroughly discussed with the patient and questions answered. The benefits, risks, alternatives and complications were also discussed. The patient understands and wishes to proceed with the procedure. Written consent was obtained. Ultrasound was performed to localize and mark an adequate pocket of fluid in the right chest. The area was then prepped and draped in the normal sterile fashion. 1% Lidocaine was used for local anesthesia. Under ultrasound guidance a 6 Fr Safe-T-Centesis catheter was introduced. Thoracentesis was performed. The catheter was removed and a dressing applied. FINDINGS: A total of approximately 500 mL of amber colored fluid was removed. Samples were sent to the laboratory as requested by the clinical team. IMPRESSION: Successful ultrasound guided right-sided therapeutic and diagnostic thoracentesis yielding 500 mL of pleural fluid. Read by: Rushie Nyhan, NP Electronically Signed   By: Aletta Edouard M.D.   On: 05/06/2020 16:14    ASSESSMENT AND PLAN: 1. Pancreas cancer-stage IV ? CT abdomen/pelvis 01/22/2020-multiple hypoattenuating/hypoenhancing liver lesions-indeterminate ? CT abdomen/pelvis 02/27/2020-ill-defined pancreas head mass, new and enlarging extensive hepatic metastases, retroperitoneal adenopathy ? MRI abdomen 02/28/2020-mass at the junction and head of the pancreas body, no vascular invasion, multiple hepatic metastases, retroperitoneal adenopathy ? Ultrasound-guided biopsy of a right liver lesion 03/01/2020-poorly differentiated carcinoma with necrosis, cytokeratin 7, cytokeratin  20, and GATA-3 positive ? Cycle 1 gemcitabine/Abraxane 03/25/2020 ? CT abdomen/pelvis 03/28/2020-increased pancreas mass, increased size and number of liver metastases, progressive ascites, stable sclerotic bone lesions at the sacrum, right acetabulum, and proximal right femur, increased periaortic lymphadenopathy ? Cycle 2 gemcitabine/Abraxane 04/08/2020 ? CT chest 04/21/2020-negative for pulmonary embolism, diffuse hepatic metastatic disease, large bilateral pleural effusions, mild ascites, right paratracheal lymphadenopathy ? Cycle 3 gemcitabine/Abraxane 05/10/2020 2. Pain secondary to #1 3. Family history of cancer-father with adrenal cancer 4. Constipation-likely secondary to narcotic analgesics 5. Hypercalcemia 03/15/2020. Zometa 03/16/2020, 04/08/2020 6. Admission 03/29/2020 with abdominal pain, nausea, and dehydration 7. Leukocytosis-likely a leukemoid reaction related to cancer 8. History of fever-tumor fever? 9. Hospital admission 05/03/2020-hypoxia, pleural effusion, anemia, AKI  Joe [name] appears stable.  He continues to have uncontrolled pain.  Respiratory status appears stable.  Underwent thoracentesis on 05/07/2020 and pleural fluid was sent for cytology which is pending.  The patient is agreeable to discharging to home with hospice.  Wife and daughter are in agreement with this plan.  Family would like to complete advanced directives and requesting chaplain consult.  CODE STATUS discussed with the patient and family.  Daughter expresses that she does not want her father to suffer anymore.  She does not him to undergo CPR or mechanical ventilation.  Wife is in agreement.  Agrees to changing from full code to DNR.  Recommendations: 1.  TOC consult placed for referral to [place]. 2.  We will start the patient on Dilaudid drip.  I have reached out to the palliative care team and have asked for their assistance in transitioning him to a  Dilaudid PCA in preparation for discharge. 3.   Chaplain consult placed per family request for assistance with advanced directives. 4.  CODE STATUS changed to DNR. 5.  Diflucan for oral candidiasis  I have updated the hospitalist and the palliative care team regarding my conversation with the patient/family and changes to orders.   LOS: 4 days   [name], DNP, AGPCNP-BC, AOCNP 04/26/20 Joe [name] was interviewed and examined.  He appears unchanged.  He continues to have abdomen/back pain.  He would like to continue IV Dilaudid.  I discontinued the MS Contin and started a Dilaudid infusion.  He will continue Dilaudid bolus treatment as needed.  Joe [name] agrees to home hospice care.  We will consult [facility] and I will plan to serve as the primary provider with the hospice team.  He can be transfused to a Dilaudid or morphine PSA at discharge depending on the preference of the hospice team.

## 2020-04-26 NOTE — Progress Notes (Signed)
Manufacturing engineer [facility])  Received request from [facility] for hospice services at home after discharge.  Chart and pt information under review by [facility] physician.  Hospice eligibility pending at this time.  Hospital liaison spoke with daughter [name] to initiate education related to hospice philosophy and services and to answer any questions at this time.  [name] verbalized understanding of information given.  Per discussion the plan is to discharge home tomorrow after CADD pump has been set up.  Pt will dc via [facility].   [place] send signed and completed DNR home with pt/family.  Please provide prescriptions at discharge as needed to ensure ongoing symptom management.    DME needs discussed.  [name] verifies need for the following:  Bed, OBT. Suction, and O2.   Address has been verified and is correct in the chart.  ACC information and contact numbers given to [facility].  Above information shared with [place] Manager.  Please call with any questions or concerns.  Thank you for the opportunity to participate in this pt's care.  Domenic Moras, BSN, RN [employer] [phone] (24h on call)

## 2020-04-26 NOTE — Telephone Encounter (Signed)
Returned phone call to [place] with Manufacturing engineer. Calling to see if Dr [name] will be the attending for hospice/pallative care. Per Dr [name] will be attending. Direct contact number for Joe [name] is [phone]. Fax # [phone]

## 2020-04-26 NOTE — Progress Notes (Signed)
Chaplain responded to spiritual consult to create Advanced Directive. Patient's in fragile weakened. His eyes were open. Patient shook his head no several times when chaplain introduced herself.  He then spoke and asked weakly for ice. She spoke with nurse.  Nurse said daughter was coming in at 43 am.  Chaplain inquired if patient was even able to understand and sign the forms.  Chaplain will be available today until 1 PM. Rev. Tamsen Snider Pager [phone]

## 2020-04-26 NOTE — Progress Notes (Signed)
Palliative:  Prescription written and faxed to [facility] to pharmacist [facility]. They received and verified all is written as they need to fill and have ready for d/c home with hospice.   Vinie Sill, NP Palliative Medicine Team Pager [phone] (Please see [email or web address] for schedule) Team Phone [phone]

## 2020-04-26 NOTE — TOC Initial Note (Signed)
Transition of Care [facility]) - Initial/Assessment Note    Patient Details  Name: Joe [name] MRN: [id number] Date of Birth: 01/08/1961  Transition of Care [facility]) CM/SW Contact:    [name], RN Phone Number: 04/26/2020, 2:09 PM  Clinical Narrative: CM referral for choice for home hospice-[facility] chosen by dtr-patient defers to dtr [name]-[facility] rep Melissa will contact dtr to discuss services-await acceptance.DME needed-hospital bed/mattress;bedsise table,02,yankeer. PTAR for non emergency transport home.DNR.R porta-cath to stay accessed patient for d/c with dilaudid pca pump.Once dme in home then patient can safely d/c home.                  Expected Discharge Plan: Home w Hospice Care Barriers to Discharge: No Barriers Identified   Patient Goals and CMS Choice Patient states their goals for this hospitalization and ongoing recovery are:: go home CMS Medicare.gov Compare Post Acute Care list provided to:: Patient Represenative (must comment) (dtr [name]) Choice offered to / list presented to : Adult Children  Expected Discharge Plan and Services Expected Discharge Plan: [place]   Discharge Planning Services: CM Consult Post Acute Care Choice: Durable Medical Equipment, Hospice Living arrangements for the past 2 months: Single Family Home                           HH Arranged: RN [facility] Agency: Hospice and [place] Date [place]: 04/26/20 Time [place]: 64 Representative spoke with at [place]: [place] Arrangements/Services Living arrangements for the past 2 months: [place] with:: Spouse, Adult Children Patient language and need for interpreter reviewed:: Yes Do you feel safe going back to the place where you live?: Yes      Need for Family Participation in Patient Care: No (Comment) Care giver support system in place?: Yes (comment)      Activities of Daily  Living Home Assistive Devices/Equipment: Walker (specify type) ADL Screening (condition at time of admission) Patient's cognitive ability adequate to safely complete daily activities?: Yes Is the patient deaf or have difficulty hearing?: No Does the patient have difficulty seeing, even when wearing glasses/contacts?: No Does the patient have difficulty concentrating, remembering, or making decisions?: No Patient able to express need for assistance with ADLs?: Yes Does the patient have difficulty dressing or bathing?: Yes Independently performs ADLs?: No Communication: Independent Dressing (OT): Needs assistance Is this a change from baseline?: Pre-admission baseline Grooming: Independent Feeding: Independent Bathing: Needs assistance Is this a change from baseline?: Change from baseline, expected to last >3 days Toileting: Needs assistance Is this a change from baseline?: Change from baseline, expected to last >3days In/Out Bed: Needs assistance Is this a change from baseline?: Change from baseline, expected to last >3 days Walks in Home: Needs assistance Is this a change from baseline?: Change from baseline, expected to last >3 days Does the patient have difficulty walking or climbing stairs?: Yes Weakness of Legs: Both Weakness of Arms/Hands: Both  Permission Sought/Granted Permission sought to share information with : Case Manager Permission granted to share information with : Yes, Verbal Permission Granted  Share Information with NAME: Case Manager  Permission granted to share info w AGENCY: [facility]        Emotional Assessment Appearance:: Appears stated age Attitude/Demeanor/Rapport: Gracious Affect (typically observed): Accepting Orientation: : Oriented to Self, Oriented to Place, Oriented to  Time, Oriented to Situation Alcohol / Substance Use: Not Applicable Psych Involvement: No (  comment)  Admission diagnosis:  Hypoxia [R09.02] Patient Active Problem List    Diagnosis Date Noted  . Pressure injury of skin 04/23/2020  . Hypoxia 04/21/2020  . Port-A-Cath in place 04/08/2020  . Cancer related pain   . Palliative care by specialist   . Ileus ([place])   . Hypokalemia 03/30/2020  . Failure to thrive in adult 03/30/2020  . Severe protein-calorie malnutrition ([place]) 03/30/2020  . Intractable nausea and vomiting   . Dehydration 03/29/2020  . Goals of care, counseling/discussion 03/09/2020  . 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 Pancreatic cancer ([place]) 02/29/2020  . Leukocytosis 02/29/2020  . Abdominal pain 02/28/2020  . Pancreatic mass 02/28/2020  . Weight loss 02/28/2020  . Liver lesion 02/28/2020  . Nausea and vomiting 02/28/2020  . Hyponatremia 02/28/2020  . Anemia 02/28/2020   PCP:  Patient, No Pcp Per Pharmacy:   [id number] [place], [place] [address] Phone: [phone] Fax: [place], [place] [address] [address] Phone: [phone] Fax: [phone]     Social Determinants of Health (SDOH) Interventions    Readmission Risk Interventions Readmission Risk Prevention Plan 04/06/2020  Transportation Screening Complete  Medication Review ([place]) Complete  PCP or Specialist appointment within 3-5 days of discharge Complete  HRI or [place] Complete  SW Recovery Care/Counseling Consult Complete  Palliative Care Screening Complete  [place] Not Applicable  Some recent data might be hidden

## 2020-04-27 DIAGNOSIS — R64 Cachexia: Secondary | ICD-10-CM

## 2020-04-27 DIAGNOSIS — D649 Anemia, unspecified: Secondary | ICD-10-CM

## 2020-04-27 DIAGNOSIS — E43 Unspecified severe protein-calorie malnutrition: Secondary | ICD-10-CM

## 2020-04-27 DIAGNOSIS — J90 Pleural effusion, not elsewhere classified: Secondary | ICD-10-CM

## 2020-04-27 DIAGNOSIS — R000 Tachycardia, unspecified: Secondary | ICD-10-CM

## 2020-04-27 DIAGNOSIS — G893 Neoplasm related pain (acute) (chronic): Secondary | ICD-10-CM

## 2020-04-27 MED ORDER — GLYCOPYRROLATE 1 MG PO TABS
1.0000 mg | ORAL_TABLET | ORAL | Status: DC | PRN
  Filled 2020-04-27: qty 1

## 2020-04-27 MED ORDER — ONDANSETRON HCL 4 MG/2ML IJ SOLN: 4.0000 mg | Freq: Four times a day (QID) | INTRAMUSCULAR | Status: DC | PRN

## 2020-04-27 MED ORDER — SODIUM CHLORIDE 0.9 % IV SOLN: INTRAVENOUS | Status: DC

## 2020-04-27 MED ORDER — ONDANSETRON 4 MG PO TBDP: 4.0000 mg | ORAL_TABLET | Freq: Four times a day (QID) | ORAL | Status: DC | PRN

## 2020-04-27 MED ORDER — ACETAMINOPHEN 650 MG RE SUPP: 650.0000 mg | Freq: Four times a day (QID) | RECTAL | Status: DC | PRN

## 2020-04-27 MED ORDER — GLYCOPYRROLATE 0.2 MG/ML IJ SOLN
0.2000 mg | INTRAMUSCULAR | Status: DC | PRN
  Filled 2020-04-27: qty 1

## 2020-04-27 MED ORDER — LORAZEPAM 2 MG/ML IJ SOLN: 0.5000 mg | INTRAMUSCULAR | Status: DC | PRN

## 2020-04-27 MED ORDER — ACETAMINOPHEN 325 MG PO TABS: 650.0000 mg | ORAL_TABLET | Freq: Four times a day (QID) | ORAL | Status: DC | PRN

## 2020-04-27 MED ORDER — HALOPERIDOL 0.5 MG PO TABS
0.5000 mg | ORAL_TABLET | ORAL | Status: DC | PRN
  Filled 2020-04-27: qty 1

## 2020-04-27 MED ORDER — POLYVINYL ALCOHOL 1.4 % OP SOLN: 1.0000 [drp] | Freq: Four times a day (QID) | OPHTHALMIC | Status: DC | PRN

## 2020-04-27 MED ORDER — HALOPERIDOL LACTATE 5 MG/ML IJ SOLN: 0.5000 mg | INTRAMUSCULAR | Status: DC | PRN

## 2020-04-27 MED ORDER — HALOPERIDOL LACTATE 2 MG/ML PO CONC
0.5000 mg | ORAL | Status: DC | PRN
  Filled 2020-04-27: qty 0.3

## 2020-04-27 MED ORDER — BIOTENE DRY MOUTH MT LIQD: 15.0000 mL | OROMUCOSAL | Status: DC | PRN

## 2020-04-27 NOTE — Progress Notes (Signed)
Daily Progress Note   Patient Name: Joe [name]       Date: 04/27/2020 DOB: 1961-09-02  Age: 59 y.o. MRN#: [id number] Attending Physician: Eugenie Filler, MD Primary Care Physician: Patient, No Pcp Per Admit Date: 04/11/2020  Reason for Consultation/Follow-up: Terminal Care  Subjective:  patient appears to be actively dying. Has labored breathing, is essentially unresponsive, has coarse breath sounds. He does open his eyes when his name is called but doesn't verbalize or follow commands.   Length of Stay: 5  Current Medications: Scheduled Meds:   Chlorhexidine Gluconate Cloth  6 each Topical Daily   fluconazole  50 mg Oral Daily    Continuous Infusions:  sodium chloride     HYDROmorphone 1 mg/hr (04/27/20 0114)    PRN Meds: acetaminophen **OR** acetaminophen, albuterol, antiseptic oral rinse, bisacodyl, glycopyrrolate **OR** glycopyrrolate **OR** glycopyrrolate, haloperidol **OR** haloperidol **OR** haloperidol lactate, HYDROmorphone (DILAUDID) injection, LORazepam, ondansetron (ZOFRAN) IV, [DISCONTINUED] ondansetron **OR** ondansetron (ZOFRAN) IV, polyvinyl alcohol  Physical Exam         Frail elderly gentleman Essentially unresponsive Coarse breath sounds Thin, cancer related cachexia evident Slightly labored work of breathing S1-S2 No edema Extremities warm to touch no coolness no mottling evident yet  Vital Signs: BP 116/85 (BP Location: Left Arm)    Pulse (!) 120    Temp (!) 97.5 F (36.4 C) (Oral)    Resp 10    Ht 6\' 1"  (1.854 m)    Wt 69.8 kg    SpO2 100%    BMI 20.30 kg/m  SpO2: SpO2: 100 % O2 Device: O2 Device: Nasal Cannula O2 Flow Rate: O2 Flow Rate (L/min): 4 L/min  Intake/output summary: No intake or output data in the 24 hours ending 04/27/20  1716 LBM: Last BM Date:  (unable to assess) Baseline Weight: Weight: 69.8 kg Most recent weight: Weight: 69.8 kg       Palliative Assessment/Data:      Patient Active Problem List   Diagnosis Date Noted   Pressure injury of skin 04/23/2020   Hypoxia 04/17/2020   Port-A-Cath in place 04/08/2020   Cancer related pain    Palliative care by specialist    Ileus ([place])    Hypokalemia 03/30/2020   Failure to thrive in adult 03/30/2020   Severe protein-calorie malnutrition ([place]) 03/30/2020   Intractable  nausea and vomiting    Dehydration 03/29/2020   Goals of care, counseling/discussion 03/09/2020   Malignant neoplasm of head of pancreas ([place]) 03/09/2020   Pancreatic cancer ([place]) 02/29/2020   Leukocytosis 02/29/2020   Abdominal pain 02/28/2020   Pancreatic mass 02/28/2020   Weight loss 02/28/2020   Liver lesion 02/28/2020   Nausea and vomiting 02/28/2020   Hyponatremia 02/28/2020   Anemia 02/28/2020    Palliative Care Assessment & Plan   Patient Profile:   Assessment: Stage IV pancreatic cancer Failure to thrive Functional decline Actively dying Shortness of breath Generalized discomfort  Recommendations/Plan:  Patient is being followed by palliative medicine team.  Initially the plan was for the patient to have Dilaudid PCA and for him to go home with hospice to be with his family. I discussed with my colleague Dr. Grandville Silos this afternoon about the patient's condition.  Patient appears to be declining rapidly.  He is now on Dilaudid drip.  Patient with signs of ongoing decline and appears to be actively dying.  Patient examined and reassessed.  Medication history reviewed. Plan: Comfort measures only Chaplain consult for end-of-life care Unrestricted visitor access Continue Dilaudid drip at current settings End-of-life order set has been initiated.  Continue as needed medications for agitation, secretion and any other symptoms at  end-of-life Do not recommend discharge to home or even a move to residential hospice, anticipate hospital death. No family present at the bedside at the time of my visit.  PMT will follow up in a.m. on 8-19.  Goals of Care and Additional Recommendations:  Limitations on Scope of Treatment: Full Comfort Care  Code Status:    Code Status Orders  (From admission, onward)         Start     Ordered   04/27/20 1715  Do not attempt resuscitation (DNR)  Continuous       Question Answer Comment  In the event of cardiac or respiratory ARREST Do not call a code blue   In the event of cardiac or respiratory ARREST Do not perform Intubation, CPR, defibrillation or ACLS   In the event of cardiac or respiratory ARREST Use medication by any route, position, wound care, and other measures to relive pain and suffering. May use oxygen, suction and manual treatment of airway obstruction as needed for comfort.      04/27/20 1715        Code Status History    Date Active Date Inactive Code Status Order ID Comments User Context   04/26/2020 0911 04/27/2020 1715 DNR [id number]  Maryanna Shape, NP Inpatient   04/15/2020 1902 04/26/2020 0910 Full Code [id number]  Emeterio Reeve, DO ED   03/29/2020 0046 04/07/2020 1940 Full Code [id number]  Elwyn Reach, MD ED   02/29/2020 1614 03/01/2020 2201 Full Code [id number]  Norval Morton, MD ED   02/28/2020 0104 02/28/2020 2338 Full Code [id number]  Arlan Organ, DO ED   Advance Care Planning Activity       Prognosis:   Hours - Days  Discharge Planning:  Anticipated Hospital Death  Care plan was discussed with [facility] MD Dr. Grandville Silos  Thank you for allowing the Palliative Medicine Team to assist in the care of this patient.   Time In: 1645 Time Out: 1720 Total Time 35 Prolonged Time Billed  no       Greater than 50%  of this time was spent counseling and coordinating care related to the above assessment and plan.  Loistine Chance, MD  Please  contact Palliative Medicine Team phone at [phone] for questions and concerns.

## 2020-04-27 NOTE — Progress Notes (Signed)
Visit to room for routine line check; pt care in progress and requires visit at later time.

## 2020-04-27 NOTE — TOC Progression Note (Signed)
Transition of Care [facility]) - Progression Note    Patient Details  Name: Joe [name] MRN: [id number] Date of Birth: [birth year]  [facility]) CM/SW Contact  Niley Helbig, Juliann Pulse, RN Phone Number: 04/27/2020, 3:38 PM  Clinical Narrative:   Nurse will take Cadd Pump to our pharmacy to keep in storage until tomorrow tentative d/c.    Expected Discharge Plan: Home w Hospice Care Barriers to Discharge: No Barriers Identified  Expected Discharge Plan and Services Expected Discharge Plan: [place]   Discharge Planning Services: CM Consult Post Acute Care Choice: Durable Medical Equipment, Hospice Living arrangements for the past 2 months: Single Family Home                           HH Arranged: RN [facility] Agency: Hospice and [place] Date [place]: 04/26/20 Time [place]: 1408 Representative spoke with at [place]: [place] ([place]) Interventions    Readmission Risk Interventions Readmission Risk Prevention Plan 04/06/2020  Transportation Screening Complete  Medication Review [place]) Complete  PCP or Specialist appointment within 3-5 days of discharge Complete  HRI or [place] Complete  SW Recovery Care/Counseling Consult Complete  Palliative Care Screening Complete  [place] Not Applicable  Some recent data might be hidden

## 2020-04-27 NOTE — TOC Progression Note (Signed)
Transition of Care [facility]) - Progression Note    Patient Details  Name: HALIM SURRETTE MRN: [id number] Date of Birth: [birth year]  Transition of Care [facility]) CM/SW Contact  Asya Derryberry, Juliann Pulse, RN Phone Number: 04/27/2020, 12:48 PM  Clinical Narrative:  Damaris Schooner to dtr Gillis Ends electrical issues w/outlet @ home-electrician will come to home tomorrow. MD notified.     Expected Discharge Plan: Home w Hospice Care Barriers to Discharge: No Barriers Identified  Expected Discharge Plan and Services Expected Discharge Plan: [place]   Discharge Planning Services: CM Consult Post Acute Care Choice: Durable Medical Equipment, Hospice Living arrangements for the past 2 months: Single Family Home                           HH Arranged: RN [facility] Agency: Hospice and [place] Date [place]: 04/26/20 Time [place]: 1408 Representative spoke with at [place]: [place] ([place]) Interventions    Readmission Risk Interventions Readmission Risk Prevention Plan 04/06/2020  Transportation Screening Complete  Medication Review Press photographer) Complete  PCP or Specialist appointment within 3-5 days of discharge Complete  HRI or [place] Complete  SW Recovery Care/Counseling Consult Complete  Palliative Care Screening Complete  [place] Not Applicable  Some recent data might be hidden

## 2020-04-27 NOTE — Progress Notes (Signed)
Pt refused to turn. Unable to assess wound on sacral area.

## 2020-04-27 NOTE — Progress Notes (Signed)
PROGRESS NOTE    Joe [name]  CHY:[id number] DOB: [birth year] DOA: 04/23/2020 PCP: Patient, No Pcp Per    Chief Complaint  Patient presents with  . Shortness of Breath    Brief Narrative:  58 bm metastatic stage IV pancreatic cancer recent admit 719-7/29 discharged with palliative care Chronic leukocytosis 3 admissions since 02/28/2020 one of them he left AMA It appears he was given gemcitabine Abraxane 7/16 and placed on medication for pain control MS Contin fentanyl  Went to oncology office 8/13 and was recommended comfort care if his clinical status did not improve as he was quite tachypneic and found to have large pleural effusions for which thoracentesis 500 cc was performed 8/13 He was offered comfort care reportedly but declined-CT chest negative for PE    Assessment & Plan:   Principal Problem:   Hypoxia Active Problems:   Hyponatremia   Anemia   Pancreatic cancer ([place])   Leukocytosis   Malignant neoplasm of head of pancreas (HCC)   Failure to thrive in adult   Severe protein-calorie malnutrition ([facility])   Cancer related pain   Pressure injury of skin   Pleural effusion   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 Patient initially was not accepting palliative care however has decided after discussions with palliative care that patient was to be discharged home with hospice care. Patient lethargic, patient looks like he is actively dying and doubt if patient will be stable enough to be discharged home with hospice. Patient likely needs residential hospice home versus in-hospital death. Patient currently comfortable on IV Dilaudid drip. Spoke with palliative care, Dr. Rowe Pavy who is in agreement and end-of-life order set to be placed per Dr. Rowe Pavy. Will allow unrestricted visitation from family. Oncology also following. Appreciate palliative care input and  recommendations.  2. Malignant pleural effusion Status post diagnostic and therapeutic thoracentesis with 500 cc of fluid removed on 04/16/2020 per Dr. Kathlene Cote, [place]. Patient on Dilaudid drip. Comfort care.  3. Cancer related cachexia/severe cancer related protein calorie malnutrition Patient severely cachectic, no cure to his metastatic pancreatic cancer. Patient seems to be actively dying. Patient on a Dilaudid drip. We will hold off on any nutritional supplementation. Comfort care.  4. Tachycardia Likely secondary to #1, 2,3. Supportive care.  5. Severe microcytic anemia with thrombocytosis Likely secondary to protein calorie malnutrition and metastatic pancreatic cancer. No further work-up needed at this time.  6. Chronic leukocytosis Felt secondary to malignancy.  7. Mild AKI Patient on comfort measures.  8. Pressure injury, POA Pressure Injury 04/15/2020 Sacrum Mid Stage 2 -  Partial thickness loss of dermis presenting as a shallow open injury with a red, pink wound bed without slough. Small 1cmx 1cm round pressure injury (Active)  04/20/2020 2000  Location: Sacrum  Location Orientation: Mid  Staging: Stage 2 -  Partial thickness loss of dermis presenting as a shallow open injury with a red, pink wound bed without slough.  Wound Description (Comments): Small 1cmx 1cm round pressure injury  Present on Admission: Yes     9. Prognosis Patient with a poor prognosis. Patient actively dying. Patient with metastatic pancreatic cancer. On Dilaudid drip. Patient likely needs a residential hospice home versus in-hospital death. Doubt if patient will be stable enough to be discharged home with hospice. End-of-life order set being placed by palliative care. Continue comfort measures. Unrestricted visitor access to family.   DVT prophylaxis: Comfort care Code Status:  DNR Family Communication: Updated wife at bedside. Disposition:   Status is: Inpatient    Dispo:  Patient From:  Home  Planned Disposition: Residential hospice home versus in-hospital death  Expected discharge date: To be determined.  Medically stable for discharge: Patient not medically stable for discharge as patient actively dying, likely not stable for discharge home with hospice will likely need a residential hospice home versus in-hospital death.        Consultants:   Palliative care: Dr. Domingo Cocking 04/24/2020    Procedures:   CT angiogram chest 04/21/2020  Chest x-ray 04/21/2020  Ultrasound-guided therapeutic and diagnostic thoracentesis 04/25/2020--500 cc pleural fluid removed per Dr. Kathlene Cote IR  Antimicrobials:   None   Subjective: Patient lethargic/drowsy. Opens eyes briefly to verbal stimuli and then drifts back off to sleep. Patient on a Dilaudid drip.  Objective: Vitals:   04/26/20 2110 04/27/20 0114 04/27/20 0355 04/27/20 0632  BP: 116/85     Pulse: (!) 120     Resp: 12 12 (!) 6 10  Temp: (!) 97.5 F (36.4 C)     TempSrc: Oral     SpO2: 100%     Weight:      Height:       No intake or output data in the 24 hours ending 04/27/20 1721 Filed Weights   05/03/2020 2019  Weight: 69.8 kg    Examination:  General exam: Drowsy. Frail. Cachectic. Emaciated. Temporal wasting. Respiratory system: Clear to auscultation anterior lung fields. Respiratory effort normal. Cardiovascular system: Tachycardia. No JVD, no murmurs rubs or gallops. 1-2+ bilateral lower extremity edema. Gastrointestinal system: Abdomen is nondistended, soft and nontender. No organomegaly or masses felt. Normal bowel sounds heard. Central nervous system: Lethargic/drowsy. Moving extremities spontaneously.  Extremities: 1-2+ bilateral lower extremity edema. Skin: No rashes, lesions or ulcers Psychiatry: Judgement and insight unable to assess as patient drowsy/lethargic. Mood & affect unable to assess..     Data Reviewed: I have personally reviewed following labs and imaging studies  CBC: Recent  Labs  Lab 04/21/2020 0913 04/17/2020 1900 04/23/20 0402  WBC 21.2* 14.1* 16.8*  NEUTROABS 17.8*  --   --   HGB 8.5* 8.6* 8.1*  HCT 24.1* 25.0* 22.9*  MCV 69.7* 71.4* 69.6*  PLT 553* 507* 474*    Basic Metabolic Panel: Recent Labs  Lab 05/07/2020 0913 05/03/2020 1900 04/23/20 0402  NA 133*  --  137  K 4.7  --  4.5  CL 100  --  99  CO2 24  --  27  GLUCOSE 132*  --  106*  BUN 16  --  22*  CREATININE 0.65 0.60* 0.54*  CALCIUM 9.9  --  9.0    GFR: Estimated Creatinine Clearance: 99.4 mL/min (A) (by C-G formula based on SCr of 0.54 mg/dL (L)).  Liver Function Tests: Recent Labs  Lab 05/08/2020 0913  AST 36  ALT 13  ALKPHOS 280*  BILITOT 0.8  PROT 6.1*  ALBUMIN 2.0*    CBG: No results for input(s): GLUCAP in the last 168 hours.   Recent Results (from the past 240 hour(s))  SARS Coronavirus 2 by RT PCR (hospital order, performed in [facility] hospital lab)     Status: None   Collection Time: 05/07/2020 10:00 AM   Specimen: Nasopharyngeal  Result Value Ref Range Status   SARS Coronavirus 2 NEGATIVE NEGATIVE Final    Comment: (NOTE) SARS-CoV-2 target nucleic acids are NOT DETECTED.  The SARS-CoV-2 RNA is generally detectable in upper and lower respiratory specimens  during the acute phase of infection. The lowest concentration of SARS-CoV-2 viral copies this assay can detect is 250 copies / mL. A negative result does not preclude SARS-CoV-2 infection and should not be used as the sole basis for treatment or other patient management decisions.  A negative result may occur with improper specimen collection / handling, submission of specimen other than nasopharyngeal swab, presence of viral mutation(s) within the areas targeted by this assay, and inadequate number of viral copies (<250 copies / mL). A negative result must be combined with clinical observations, patient history, and epidemiological information.  Fact Sheet for Patients:    StrictlyIdeas.no  Fact Sheet for Healthcare Providers: BankingDealers.co.za  This test is not yet approved or  cleared by the [place] FDA and has been authorized for detection and/or diagnosis of SARS-CoV-2 by FDA under an Emergency Use Authorization (EUA).  This EUA will remain in effect (meaning this test can be used) for the duration of the COVID-19 declaration under Section 564(b)(1) of the Act, 21 U.S.C. section 360bbb-3(b)(1), unless the authorization is terminated or revoked sooner.  Performed at [facility], [place] [address] [place], [place] [address]          Radiology Studies: No results found.      Scheduled Meds: . Chlorhexidine Gluconate Cloth  6 each Topical Daily  . fluconazole  50 mg Oral Daily   Continuous Infusions: . sodium chloride    . HYDROmorphone 1 mg/hr (04/27/20 0114)     LOS: 5 days    Time spent: 40 minutes    Irine Seal, MD [facility]   To contact the attending provider between 7A-7P or the covering provider during after hours 7P-7A, please log into the web site [email or web address] and access using universal [place] password for that web site. If you do not have the password, please call the hospital operator.  04/27/2020, 5:21 PM

## 2020-04-27 NOTE — TOC Transition Note (Signed)
Transition of Care [facility]) - CM/SW Discharge Note   Patient Details  Name: Joe [name] MRN: [id number] Date of Birth: 1960/09/24  Transition of Care [facility]) CM/SW Contact:  [name], RN Phone Number: 04/27/2020, 9:52 AM   Clinical Narrative: Spoke to dtr [name]-d/c plan home w/[facility]-home w/hospice-rep [place]-accepted:Patient will have dilaudid CADD Pump connected prior d/c;dme to be delivered to home hospital bed,02,other dme prior d/c also. PTAR to be called once all set up-[facility] will manage all dme,infusion pump.     Final next level of care: Home w Hospice Care Barriers to Discharge: No Barriers Identified   Patient Goals and CMS Choice Patient states their goals for this hospitalization and ongoing recovery are:: go home CMS Medicare.gov Compare Post Acute Care list provided to:: Patient Represenative (must comment) (dtr [name]) Choice offered to / list presented to : Adult Children  Discharge Placement                Patient to be transferred to facility by: [name] Name of family member notified: [name] dtr [phone] Patient and family notified of of transfer: 04/27/20  Discharge Plan and Services   Discharge Planning Services: CM Consult Post Acute Care Choice: Durable Medical Equipment, Hospice                    HH Arranged: RN [facility] Agency: [place] Date [place]: 04/26/20 Time [place]: 11 Representative spoke with at [place]: [place] ([place]) Interventions     Readmission Risk Interventions Readmission Risk Prevention Plan 04/06/2020  Transportation Screening Complete  Medication Review Press photographer) Complete  PCP or Specialist appointment within 3-5 days of discharge Complete  HRI or [place] Complete  SW Recovery Care/Counseling Consult Complete  Palliative Care Screening Complete  [place] Not  Applicable  Some recent data might be hidden

## 2020-04-28 ENCOUNTER — Inpatient Hospital Stay: Payer: BC Managed Care – PPO | Admitting: Nurse Practitioner

## 2020-04-28 ENCOUNTER — Inpatient Hospital Stay: Payer: BC Managed Care – PPO

## 2020-04-28 DIAGNOSIS — J910 Malignant pleural effusion: Secondary | ICD-10-CM

## 2020-04-28 DIAGNOSIS — L8992 Pressure ulcer of unspecified site, stage 2: Secondary | ICD-10-CM

## 2020-05-03 LAB — CYTOLOGY - NON PAP

## 2020-05-06 ENCOUNTER — Other Ambulatory Visit: Payer: BC Managed Care – PPO

## 2020-05-06 ENCOUNTER — Ambulatory Visit: Payer: BC Managed Care – PPO

## 2020-05-06 ENCOUNTER — Ambulatory Visit: Payer: BC Managed Care – PPO | Admitting: Nurse Practitioner

## 2020-05-11 NOTE — Progress Notes (Addendum)
Chaplain present on unit for support around pt death.   Introduced spiritual care with family at bedside.  Family acknowledge chaplain support. Chaplain shared space for grief support, life review, prayers and spiritual support.    Chaplain remains on unit for continued support as needed.

## 2020-05-11 NOTE — Progress Notes (Signed)
PMT no charge note  Patient was seen and examined earlier this am, he was on Dilaudid drip, essentially unresponsive but comfortable. He was not displaying non verbal signs of distress/discomfort: he did not have any wincing grimacing clenching of jaw or furrowing of brow. There was no family at bedside at the time of my visit this am. Med history reviewed again this am and comfort measures were continued.   Further chart review notes that patient died later this am and family has been notified by bedside RN. Appreciate RN and chaplain follow up.   No charge Loistine Chance MD [facility] health palliative [phone].

## 2020-05-11 NOTE — Progress Notes (Signed)
Patient's family at bedside, support given to family. No more family to visit per wife. Eye prepared for possible donation.

## 2020-05-11 NOTE — Progress Notes (Signed)
Rn entered room and patient without resp and HR. Ausculation performed without HR or respirations heard. Glennie Hawk RN verified time of death at 11:47am. MD made aware.

## 2020-05-11 NOTE — TOC Transition Note (Signed)
Transition of Care [facility]) - CM/SW Discharge Note   Patient Details  Name: Joe [name] MRN: [id number] Date of Birth: March 26, 1961  Transition of Care [facility]) CM/SW Contact:  [name], RN Phone Number: May 19, 2020, 12:20 PM   Clinical Narrative: Informed [facility] rep MaryAnne;also pharmacy Estill Bamberg to dispose of Cadd pump box. No further CM needs.      Final next level of care: Expired Barriers to Discharge: No Barriers Identified   Patient Goals and CMS Choice Patient states their goals for this hospitalization and ongoing recovery are:: go home CMS Medicare.gov Compare Post Acute Care list provided to:: Patient Represenative (must comment) (dtr [name]) Choice offered to / list presented to : Adult Children  Discharge Placement                Patient to be transferred to facility by: [place] Name of family member notified: [name] dtr [phone] Patient and family notified of of transfer: 04/27/20  Discharge Plan and Services   Discharge Planning Services: CM Consult Post Acute Care Choice: Durable Medical Equipment, Hospice                    HH Arranged: RN [facility] Agency: [place] Date [place]: 04/26/20 Time [place]: 15 Representative spoke with at [place]: [place] ([place]) Interventions     Readmission Risk Interventions Readmission Risk Prevention Plan 04/06/2020  Transportation Screening Complete  Medication Review Press photographer) Complete  PCP or Specialist appointment within 3-5 days of discharge Complete  HRI or [place] Complete  SW Recovery Care/Counseling Consult Complete  Palliative Care Screening Complete  [place] Not Applicable  Some recent data might be hidden

## 2020-05-11 NOTE — Progress Notes (Signed)
Nutrition Brief Note  Chart reviewed. Patient was assessed remotely by another RD on 8/14. Patient is now [place]. Palliative Care last saw patient yesterday evening and note from that time indicated that patient is comfort care only and appears to be actively dying.    No further nutrition interventions warranted at this time.  Please re-consult as needed.       Jarome Matin, MS, RD, LDN, CNSC Inpatient Clinical Dietitian RD pager # available in [place]  After hours/weekend pager # available in [facility]

## 2020-05-11 NOTE — Death Summary Note (Signed)
DEATH SUMMARY   Patient Details  Name: Joe [name] MRN: [id number] DOB: 1961-07-20  Admission/Discharge Information   Admit Date:  2020-05-21  Date of Death: Date of Death: May 27, 2020  Time of Death: Time of Death: 52  Length of Stay: December 13, 2022  Referring Physician: Patient, No Pcp Per   Reason(s) for Hospitalization  Shortness of breath  Diagnoses  Preliminary cause of death:  Secondary Diagnoses (including complications and co-morbidities):  Principal Problem:   Hypoxia Active Problems:   Hyponatremia   Anemia   Pancreatic cancer ([place])   Leukocytosis   Malignant neoplasm of head of pancreas ([place])   Failure to thrive in adult   Severe protein-calorie malnutrition ([place])   Cancer related pain   Pressure injury of skin   Pleural effusion   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 year old male metastatic stage IV pancreatic cancer recent admit 719-7/29 discharged with palliative care Chronic leukocytosis 3 admissions since 02/28/2020 one of them he left AMA It appears he was given gemcitabine Abraxane 7/16 and placed on medication for pain control MS Contin fentanyl  Went to oncology office 2023-05-22 and was recommended comfort care if his clinical status did not improve as he was quite tachypneic and found to have large pleural effusions for which thoracentesis 500 cc was performed 2023/05/22 He was offered comfort care reportedly but declined-CT chest negative for PE  #1 metastatic end-stage pancreatic cancer/failure to thrive Patient with metastatic end-stage pancreatic cancer. Patient initially was not accepting palliative care however decided after discussions with palliative care that patient was to be discharged home with hospice care.  On 05/26/2020, patient noted to be lethargic, patient was actively dying and it was felt  patient was not stable to discharge home with hospice.  Patient was placed on IV Dilaudid drip.  Palliative care was following the patient throughout the hospitalization on 05/26/20 palliative care was informed of patient's deterioration.  Dr. Maurene Capes with palliative care came and reassess the patient and was in agreement and end-of-life order set to be placed per Dr. Rowe Pavy. Unrestricted visitation from family was granted.  Patient made comfortable.  Patient subsequently died at 11:47 AM on 2020/05/26.  May his soul rest in peace.  2. Malignant pleural effusion Status post diagnostic and therapeutic thoracentesis with 500 cc of fluid removed on May 21, 2020 per Dr. Kathlene Cote, [place]. Patient was placed on Dilaudid drip. Comfort care measures instituted.  Patient subsequently died at 11:47 AM on 05/27/20..  3. Cancer related cachexia/severe cancer related protein calorie malnutrition Patient severely cachectic, no cure to his metastatic pancreatic cancer. Patient deteriorated during the hospitalization, on 05/26/2020 patient noted to be actively dying. Patient was subsequently placed on a Dilaudid drip.  Palliative care followed the patient throughout the hospitalization.  Patient was kept comfortable.  Patient subsequently died at 11:47 AM on 05/27/20.   4. Tachycardia Likely secondary to #1, 2,3. Supportive care.  5. Severe microcytic anemia with thrombocytosis Likely secondary to protein calorie malnutrition and metastatic pancreatic cancer. No further work-up needed at this time.  Decision was made to initially discharge patient home with hospice.  Patient however deteriorated during the hospitalization was felt patient was not stable enough to be discharged home with hospice and patient subsequently died at 11:47 AM on 27-May-2020.  6. Chronic leukocytosis Felt secondary to malignancy.  7. Mild AKI Patient on  comfort measures.  8. Pressure injury, POA Pressure Injury 04/21/2020 Sacrum Mid Stage 2 -   Partial thickness loss of dermis presenting as a shallow open injury with a red, pink wound bed without slough. Small 1cmx 1cm round pressure injury (Active)  05/02/2020 2000  Location: Sacrum  Location Orientation: Mid  Staging: Stage 2 -  Partial thickness loss of dermis presenting as a shallow open injury with a red, pink wound bed without slough.  Wound Description (Comments): Small 1cmx 1cm round pressure injury  Present on Admission: Yes     9. Prognosis Patient with a poor prognosis. Patient was noted to be actively dying on 04/27/2020. Patient with metastatic pancreatic cancer. On Dilaudid drip. Patient likely needed a residential hospice home versus in-hospital death. Doubt if patient will be stable enough to be discharged home with hospice. End-of-life order set placed by palliative care. Unrestricted visitor access to family was granted.  Patient was made comfortable.  Patient was followed by palliative care.  Patient subsequently died at 11:47 AM on 14-May-2020.2023-02-16 his soul rest in peace.      Pertinent Labs and Studies  Significant Diagnostic Studies DG Chest 2 View  Result Date: 03/30/2020 CLINICAL DATA:  Metastatic pancreatic cancer. EXAM: CHEST - 2 VIEW COMPARISON:  None. FINDINGS: Low volume film with right base atelectasis or infiltrate in small right pleural effusion. Right Port-A-Cath tip overlies the right atrium. The visualized bony structures of the thorax show now acute abnormality. IMPRESSION: Low volume film with right base atelectasis or infiltrate and small right pleural effusion. Electronically Signed   By: Misty Stanley M.D.   On: 03/30/2020 11:59   DG Abd 1 View  Result Date: 04/02/2020 CLINICAL DATA:  Follow-up ileus. Upper abdominal pain, nausea and loose stools. EXAM: ABDOMEN - 1 VIEW COMPARISON:  04/01/2020 FINDINGS: Again seen is gaseous distension of the transverse colon. The appearance is similar to the previous exam with abrupt cut off at the level of  the proximal descending colon. No dilated small bowel loops No small bowel dilatation. Visualized osseous structures are unremarkable. IMPRESSION: Persistent gaseous distension of the transverse colon with abrupt cut off at the level of the proximal descending colon. Electronically Signed   By: Kerby Moors M.D.   On: 04/02/2020 10:49   DG Abd 1 View  Result Date: 04/01/2020 CLINICAL DATA:  Obstruction EXAM: ABDOMEN - 1 VIEW COMPARISON:  03/31/2020, 03/30/2020 FINDINGS: Gaseous distention of transverse and ascending colon. Abrupt cut off at proximal descending colon again seen. Cannot exclude obstruction at the splenic flexure. Opacity projecting over mid abdomen question retained contrast versus superimposed radiopacity. No small bowel dilatation. Osseous structures unremarkable. Increased attenuation in the flanks suggesting ascites. IMPRESSION: Probable ascites. Abrupt cut off of gas-filled transverse colon at the splenic flexure, question colonic obstruction versus ileus. Electronically Signed   By: Lavonia Dana M.D.   On: 04/01/2020 09:15   DG Abd 1 View  Result Date: 03/31/2020 CLINICAL DATA:  Diffuse abdominal pain EXAM: ABDOMEN - 1 VIEW COMPARISON:  03/30/2020 FINDINGS: Gaseous distention of transverse colon. Small amount gas in rectum. Small bowel gas pattern normal. No bowel wall thickening. Osseous structures unremarkable. No urinary tract calcification. IMPRESSION: Mild gaseous distention of transverse colon without definite evidence of obstruction or wall thickening. Electronically Signed   By: Lavonia Dana M.D.   On: 03/31/2020 13:57   CT ANGIO CHEST PE W OR WO CONTRAST  Result Date: 04/21/2020 CLINICAL DATA:  Tachycardia and hypoxia EXAM: CT ANGIOGRAPHY CHEST WITH  CONTRAST TECHNIQUE: Multidetector CT imaging of the chest was performed using the standard protocol during bolus administration of intravenous contrast. Multiplanar CT image reconstructions and MIPs were obtained to evaluate the  vascular anatomy. CONTRAST:  34mL OMNIPAQUE IOHEXOL 350 MG/ML SOLN COMPARISON:  03/30/2020 FINDINGS: Cardiovascular: Thoracic aorta demonstrates atherosclerotic calcifications without aneurysmal dilatation. Heart is not significantly enlarged. Pulmonary artery shows a normal branching pattern. No definitive filling defect to suggest pulmonary embolism is noted. Mediastinum/Nodes: Thoracic inlet shows a prominent 2 cm hypodense nodule in the left lobe of thyroid. Fullness in the right paratracheal region is noted consistent with lymphadenopathy. This measures approximately 18 mm in short axis. The esophagus is unremarkable. Lungs/Pleura: Large bilateral pleural effusions are noted. Compensatory lower lobe atelectatic changes are seen. No focal nodules are noted. No confluent infiltrate is seen. Upper Abdomen: Visualized upper abdomen demonstrates mild ascites. Additionally there are multiple hypodense lesions throughout the liver consistent with the given clinical history of pancreatic carcinoma and metastatic disease. Musculoskeletal: Mild degenerative change of the thoracic spine is noted. Review of the MIP images confirms the above findings. IMPRESSION: No evidence of pulmonary emboli. Diffuse hepatic metastatic disease consistent with the given clinical history. Large bilateral pleural effusions which were not visualized on the most recent CT examination. Mild ascites. Fullness in the right paratracheal region consistent with lymphadenopathy. Electronically Signed   By: Inez Catalina M.D.   On: 04/21/2020 12:59   DG Chest Port 1 View  Result Date: 05/01/2020 CLINICAL DATA:  Status post thoracentesis. EXAM: PORTABLE CHEST 1 VIEW COMPARISON:  03/30/2020 FINDINGS: Previously noted right pleural effusion has been evacuated. No pneumothorax. Small left pleural effusion has developed. No superimposed focal pulmonary infiltrate. Note right internal jugular chest port with its tip within the right atrium is unchanged.  Cardiomediastinal silhouette unremarkable. Pulmonary vascularity is normal. No acute bone abnormality. IMPRESSION: 1. No pneumothorax following right thoracentesis. 2. Small left pleural effusion has developed. Electronically Signed   By: Fidela Salisbury MD   On: 04/20/2020 15:41   DG Abd 2 Views  Result Date: 03/30/2020 CLINICAL DATA:  Metastatic pancreatic cancer with recurrent nausea and vomiting. EXAM: ABDOMEN - 2 VIEW COMPARISON:  CT scan 03/28/2020 FINDINGS: Two views study shows no intraperitoneal free air. Gaseous distention of small bowel and colon is evident. Centralization of bowel loops suggests ascites. No worrisome lytic or sclerotic osseous abnormality. IMPRESSION: Gaseous distention of small bowel and colon. Imaging features suggest ileus. No intraperitoneal free air Electronically Signed   By: Misty Stanley M.D.   On: 03/30/2020 11:58   IR THORACENTESIS ASP PLEURAL SPACE W/IMG GUIDE  Result Date: 04/13/2020 INDICATION: Patient history of cancer pancreatic cancer with shortness of breath found to have a right-sided pleural effusion. Request is for therapeutic and diagnostic right-sided thoracentesis EXAM: ULTRASOUND GUIDED THERAPEUTIC AND DIAGNOSTIC THORACENTESIS MEDICATIONS: Lidocaine 1% 10 mL COMPLICATIONS: None immediate. Patient presented to interventional radiology department with oxygen saturation of 81 on room tachycardic in 82 with tachypnea. Patient unable to speak in full sentences. Recommended 2 patient and wife the patient be seen in the ED post procedure for further evaluation and possible intervention. PROCEDURE: An ultrasound guided thoracentesis was thoroughly discussed with the patient and questions answered. The benefits, risks, alternatives and complications were also discussed. The patient understands and wishes to proceed with the procedure. Written consent was obtained. Ultrasound was performed to localize and mark an adequate pocket of fluid in the right chest. The area  was then prepped and draped in the normal  sterile fashion. 1% Lidocaine was used for local anesthesia. Under ultrasound guidance a 6 Fr Safe-T-Centesis catheter was introduced. Thoracentesis was performed. The catheter was removed and a dressing applied. FINDINGS: A total of approximately 500 mL of amber colored fluid was removed. Samples were sent to the laboratory as requested by the clinical team. IMPRESSION: Successful ultrasound guided right-sided therapeutic and diagnostic thoracentesis yielding 500 mL of pleural fluid. Read by: Rushie Nyhan, NP Electronically Signed   By: Aletta Edouard M.D.   On: 04/11/2020 16:14    Microbiology Recent Results (from the past 240 hour(s))  SARS Coronavirus 2 by RT PCR (hospital order, performed in [place] hospital lab)     Status: None   Collection Time: 04/16/2020 10:00 AM   Specimen: Nasopharyngeal  Result Value Ref Range Status   SARS Coronavirus 2 NEGATIVE NEGATIVE Final    Comment: (NOTE) SARS-CoV-2 target nucleic acids are NOT DETECTED.  The SARS-CoV-2 RNA is generally detectable in upper and lower respiratory specimens during the acute phase of infection. The lowest concentration of SARS-CoV-2 viral copies this assay can detect is 250 copies / mL. A negative result does not preclude SARS-CoV-2 infection and should not be used as the sole basis for treatment or other patient management decisions.  A negative result may occur with improper specimen collection / handling, submission of specimen other than nasopharyngeal swab, presence of viral mutation(s) within the areas targeted by this assay, and inadequate number of viral copies (<250 copies / mL). A negative result must be combined with clinical observations, patient history, and epidemiological information.  Fact Sheet for Patients:   StrictlyIdeas.no  Fact Sheet for Healthcare Providers: BankingDealers.co.za  This test is not yet  approved or  cleared by the [place] FDA and has been authorized for detection and/or diagnosis of SARS-CoV-2 by FDA under an Emergency Use Authorization (EUA).  This EUA will remain in effect (meaning this test can be used) for the duration of the COVID-19 declaration under Section 564(b)(1) of the Act, 21 U.S.C. section 360bbb-3(b)(1), unless the authorization is terminated or revoked sooner.  Performed at [facility], [address] [place], [place] [address]     Lab Basic Metabolic Panel: Recent Labs  Lab 04/14/2020 0913 04/27/2020 1900 04/23/20 0402  NA 133*  --  137  K 4.7  --  4.5  CL 100  --  99  CO2 24  --  27  GLUCOSE 132*  --  106*  BUN 16  --  22*  CREATININE 0.65 0.60* 0.54*  CALCIUM 9.9  --  9.0   Liver Function Tests: Recent Labs  Lab 04/24/2020 0913  AST 36  ALT 13  ALKPHOS 280*  BILITOT 0.8  PROT 6.1*  ALBUMIN 2.0*   No results for input(s): LIPASE, AMYLASE in the last 168 hours. No results for input(s): AMMONIA in the last 168 hours. CBC: Recent Labs  Lab 05/05/2020 0913 05/04/2020 1900 04/23/20 0402  WBC 21.2* 14.1* 16.8*  NEUTROABS 17.8*  --   --   HGB 8.5* 8.6* 8.1*  HCT 24.1* 25.0* 22.9*  MCV 69.7* 71.4* 69.6*  PLT 553* 507* 474*   Cardiac Enzymes: No results for input(s): CKTOTAL, CKMB, CKMBINDEX, TROPONINI in the last 168 hours. Sepsis Labs: Recent Labs  Lab 04/24/2020 0913 04/24/2020 1900 04/23/20 0402  WBC 21.2* 14.1* 16.8*    Procedures/Operations   CT angiogram chest 04/21/2020  Chest x-ray 04/25/2020  Ultrasound-guided therapeutic and diagnostic thoracentesis 04/26/2020--500 cc pleural fluid removed  per Dr. Kathlene Cote IR   Irine Seal May 04, 2020, 12:45 PM

## 2020-05-11 NOTE — Progress Notes (Signed)
Called Deagen Krass spouse and updated on patients death. Wife upset and other members of family/support system stated that they would be able to bring her to hospital. Mable Fill paged.

## 2020-05-11 NOTE — Progress Notes (Signed)
°   2020/05/03 0900  Clinical Encounter Type  Visited With Patient  Visit Type Initial;Psychological support;Spiritual support;Patient actively dying  Referral From Palliative care team;Nurse  Consult/Referral To Chaplain  Spiritual Encounters  Spiritual Needs Prayer;Emotional;Other (Comment) (Spiritual Care Conversation/Support)  Stress Factors  Patient Stress Factors None identified   I visited with Juanda Crumble per spiritual care consult. He did not identify any needs. I prayed at the bedside.   Please, contact Spiritual Care for further assistance.   Chaplain Shanon Ace M.Div., [facility]

## 2020-05-11 NOTE — Progress Notes (Signed)
Wasted 38ml of IV dilaudid, witnessed by Wess Botts, RN.

## 2020-05-11 DEATH — deceased

## 2020-05-13 ENCOUNTER — Ambulatory Visit: Payer: BC Managed Care – PPO

## 2020-05-20 DIAGNOSIS — J910 Malignant pleural effusion: Secondary | ICD-10-CM

## 2021-07-11 IMAGING — MR MR ABDOMEN WO/W CM MRCP
19 of 23 series · 44 of 48 positions shown · IV contrast (gadavist)
Comparison: CT 02/27/2020

CLINICAL DATA: Painless jaundice, weight loss, pancreatic mass and
hepatic metastasis on CT

EXAM:
MRI ABDOMEN WITHOUT AND WITH CONTRAST (INCLUDING MRCP)
TECHNIQUE: Multiplanar multisequence MR imaging of the abdomen was performed
both before and after the administration of intravenous contrast.
Heavily T2-weighted images of the biliary and pancreatic ducts were
obtained, and three-dimensional MRCP images were rendered by post
processing.
CONTRAST:  7mL GADAVIST GADOBUTROL 1 MMOL/ML IV SOLN

[Series 4: ax haste · axial · 6.0mm · 1.19mm/px · 1 of 38 slices shown]
[im 1/38]
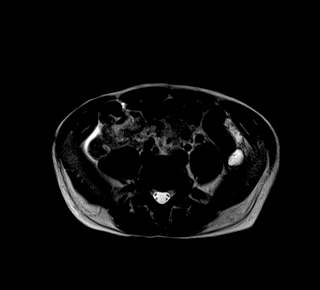

[Series 5: bSSFP · coronal · 6.0mm · 0.74mm/px · 1 of 30 slices shown]
[im 1/30]
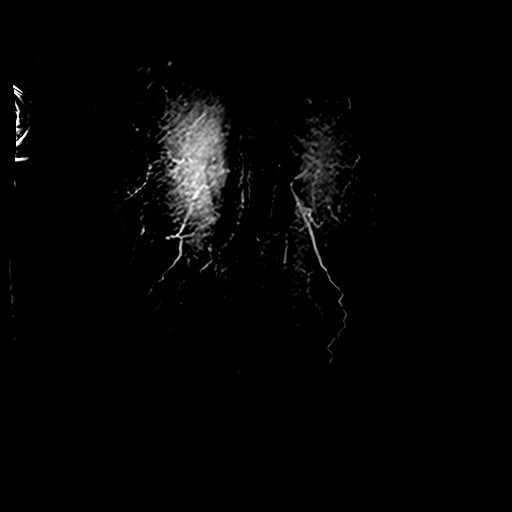

[Series 6: T2 fat-sat · axial · 6.0mm · 1.19mm/px · 1 of 38 slices shown]
[im 1/38]
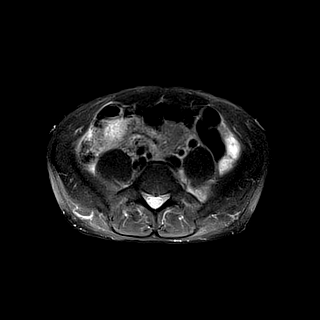

[Series 9: DWI · axial · 6.0mm · 1.42mm/px · z∈[-175,+91]mm · 3 of 114 slices shown (1 of 2)]
[im 1/114]
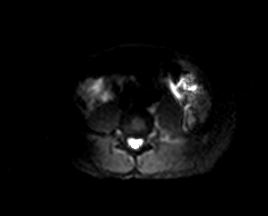
[im 57/114]
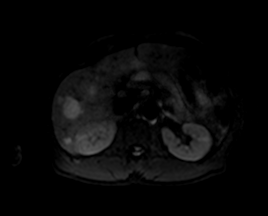
[im 114/114]
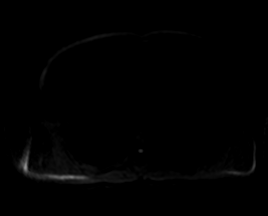

[Series 10: DWI · axial · 6.0mm · 1.42mm/px · 1 of 38 slices shown (2 of 2)]
[im 1/38]
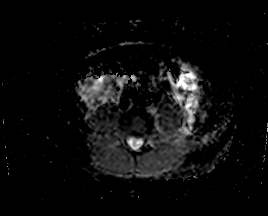

[Series 13: radials · coronal · 50.0mm · 0.78mm/px · 1 of 5 slices shown]
[im 1/5]
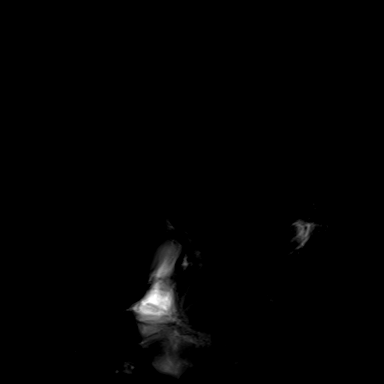

[Series 14: ax in and · axial · 3.0mm · 1.19mm/px · z∈[-174,+87]mm · 3 of 88 slices shown (1 of 2)]
[im 1/88]
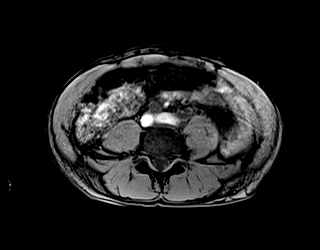
[im 44/88]
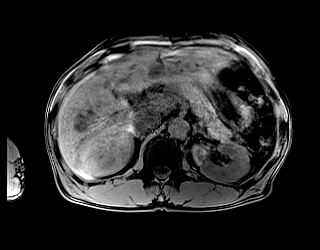
[im 88/88]
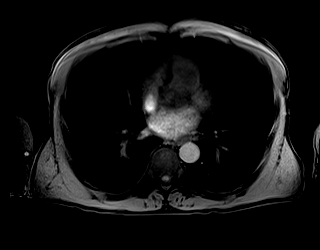

[Series 14: ax in and · axial · 3.0mm · 1.19mm/px · z∈[-174,+87]mm · 3 of 88 slices shown (2 of 2)]
[im 1/88]
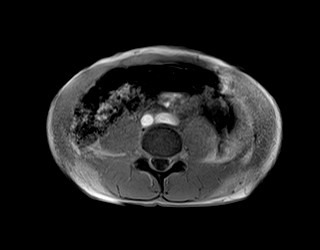
[im 44/88]
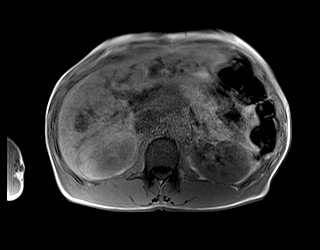
[im 88/88]
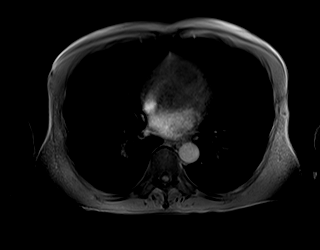

[Series 15: MRCP · coronal · 4.0mm · 1.12mm/px · 1 of 15 slices shown]
[im 1/15]
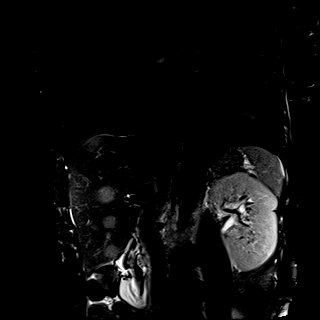

[Series 16: T1 dynamic · axial · non-contrast · 3.0mm · 1.19mm/px · z∈[-177,+84]mm · 3 of 88 slices shown (1 of 5)]
[im 1/88]
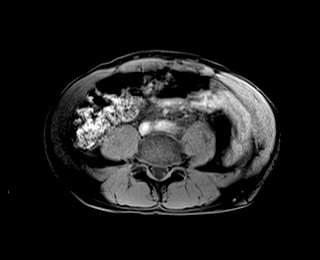
[im 44/88]
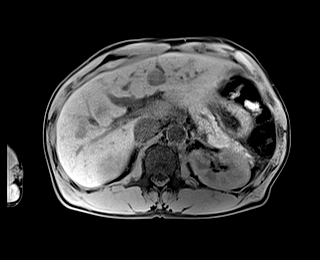
[im 88/88]
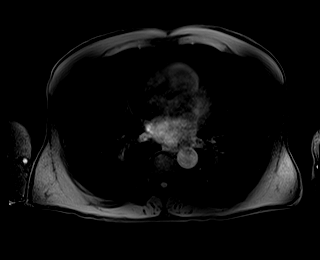

[Series 18: T1 dynamic post-contrast · axial · 3.0mm · 1.19mm/px · z∈[-177,+84]mm · 3 of 88 slices shown (1 of 5)]
[im 1/88]
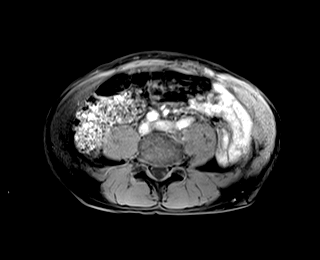
[im 44/88]
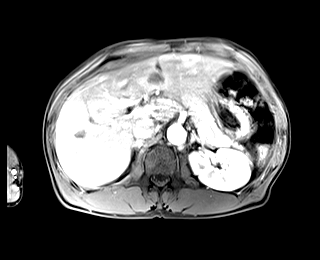
[im 88/88]
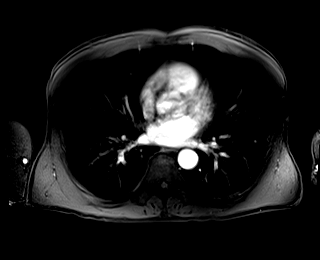

[Series 19: T1 dynamic · axial · 3.0mm · 1.19mm/px · z∈[-177,+84]mm · 3 of 88 slices shown (2 of 5)]
[im 1/88]
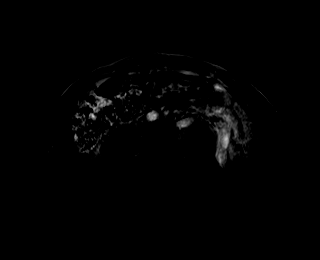
[im 44/88]
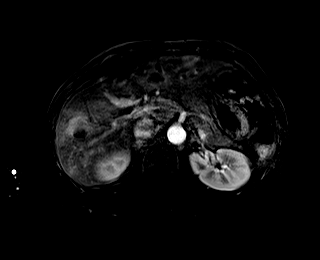
[im 88/88]
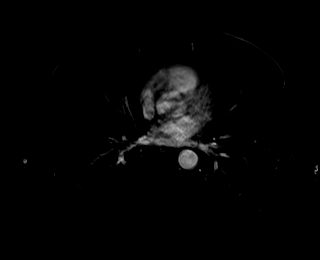

[Series 20: T1 dynamic post-contrast · axial · 3.0mm · 1.19mm/px · z∈[-177,+84]mm · 3 of 88 slices shown (2 of 5)]
[im 1/88]
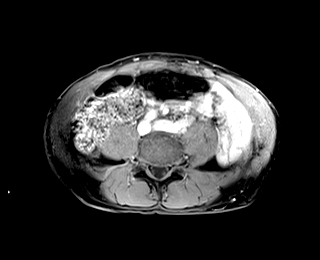
[im 44/88]
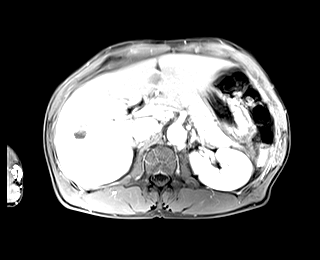
[im 88/88]
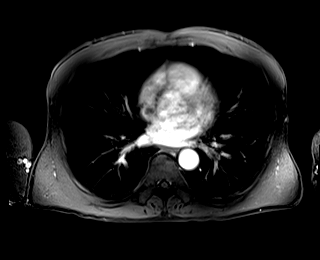

[Series 21: T1 dynamic · axial · 3.0mm · 1.19mm/px · z∈[-177,+84]mm · 3 of 88 slices shown (3 of 5)]
[im 1/88]
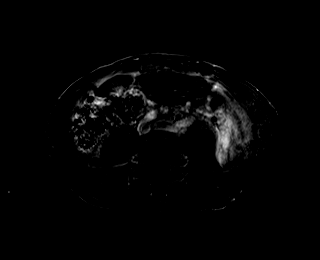
[im 44/88]
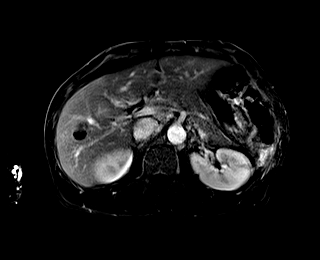
[im 88/88]
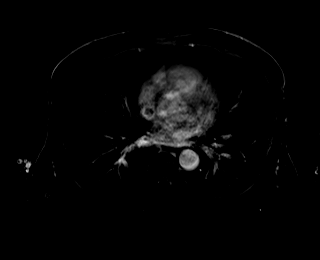

[Series 22: T1 dynamic post-contrast · axial · 3.0mm · 1.19mm/px · z∈[-177,+84]mm · 3 of 88 slices shown (3 of 5)]
[im 1/88]
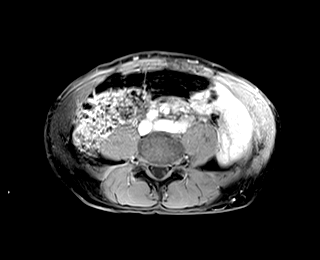
[im 44/88]
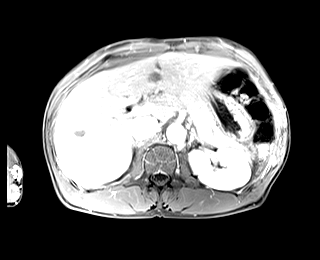
[im 88/88]
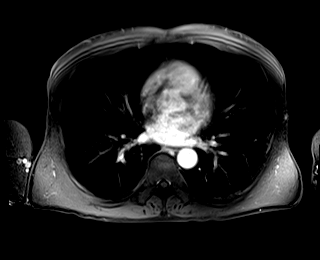

[Series 23: T1 dynamic · axial · 3.0mm · 1.19mm/px · z∈[-177,+84]mm · 3 of 88 slices shown (4 of 5)]
[im 1/88]
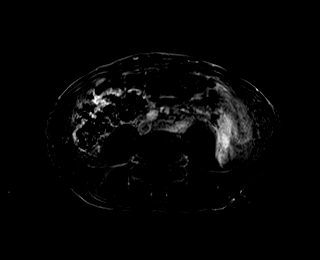
[im 44/88]
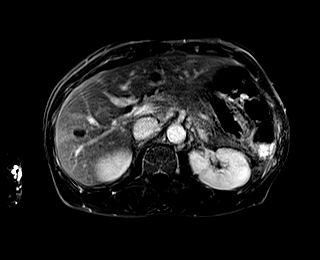
[im 88/88]
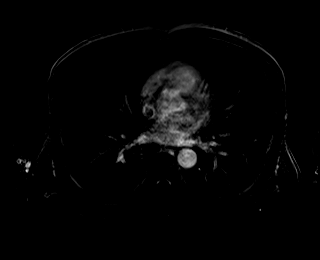

[Series 24: T1 dynamic post-contrast · axial · 3.0mm · 1.19mm/px · z∈[-177,+84]mm · 3 of 88 slices shown (4 of 5)]
[im 1/88]
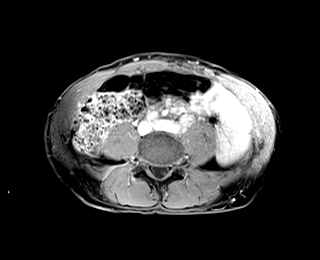
[im 44/88]
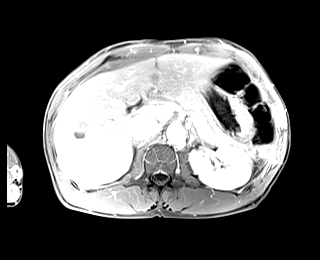
[im 88/88]
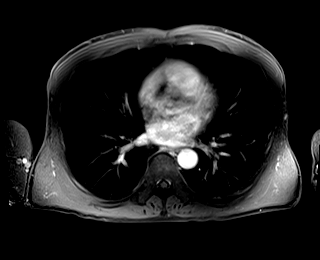

[Series 25: T1 dynamic · axial · 3.0mm · 1.19mm/px · z∈[-177,+84]mm · 3 of 88 slices shown (5 of 5)]
[im 1/88]
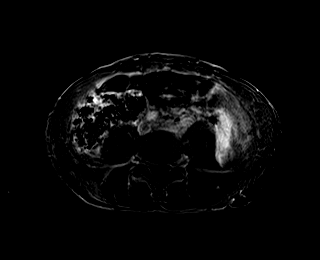
[im 44/88]
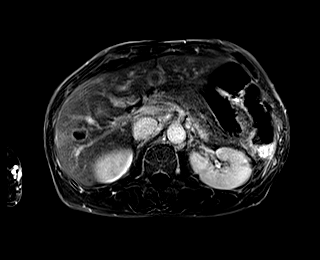
[im 88/88]
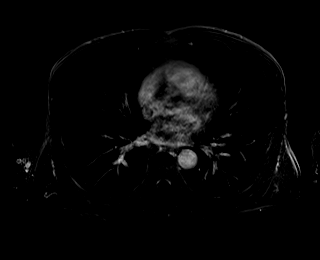

[Series 26: T1 dynamic post-contrast · coronal · 3.0mm · 1.31mm/px · 2 of 72 slices shown (5 of 5)]
[im 1/72]
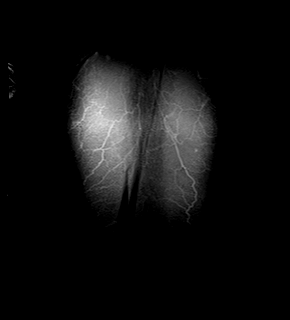
[im 72/72]
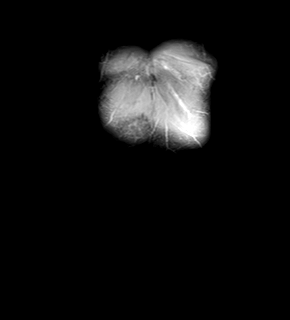

[44 of 48 positions shown; findings below may reference images not displayed]

FINDINGS: Lower chest:  Lung bases are clear.

Hepatobiliary: Multifocal round enhancing lesions consistent with
hepatic metastasis. Approximately 20 lesions in the liver ranging
size from 1-3 cm. Example lesion in the RIGHT hepatic lobe measures
2.9 cm image 28/18. Example lesion LEFT hepatic lobe measures 2.1 cm
on image 31/18. These lesions have a rim enhancing pattern typical
metastasis. Gallbladder normal. No biliary duct dilatation. Common
bile duct is normal.

Pancreas: At the junction of the body and head of the pancreas,
there is a hypoenhancing mass measuring 3.0 by 2.2 cm (image 68/18).
This lesion has imaging characteristics typical of pancreatic
adenocarcinoma. Lesion is free of the superior mesenteric artery.
Lesion appears free of the celiac trunk and branches. Lesion appears
free of the SMV and the portal vein.

Several enlarged periaortic retroperitoneal lymph nodes. One lymph
node LEFT of the aorta measures 14 mm on image 59/21 consistent with
retroperitoneal nodal metastasis.

Spleen: Normal spleen.

Adrenals/urinary tract: Adrenal glands and kidneys are normal.

Stomach/Bowel: Stomach and limited of the small bowel is
unremarkable

Vascular/Lymphatic: Abdominal aortic normal caliber. No
retroperitoneal periportal lymphadenopathy.

Musculoskeletal: No aggressive osseous lesion
IMPRESSION: 1. Imaging findings most consistent with stage IV pancreatic
adenocarcinoma.
2. Pancreatic mass at the junction of the head and body appear.
3. Multifocal hepatic metastasis.
4. Retroperitoneal nodal metastasis.
